# Patient Record
Sex: Female | Born: 1955 | Race: White | Hispanic: No | Marital: Married | Smoking: Never smoker
Health system: Southern US, Community
[De-identification: ages and names within clinical notes are randomized; demographics above are authoritative.]

## PROBLEM LIST (undated history)

## (undated) DIAGNOSIS — I471 Supraventricular tachycardia: Secondary | ICD-10-CM

## (undated) DIAGNOSIS — M2480 Other specific joint derangements of unspecified joint, not elsewhere classified: Secondary | ICD-10-CM

## (undated) DIAGNOSIS — C801 Malignant (primary) neoplasm, unspecified: Secondary | ICD-10-CM

## (undated) DIAGNOSIS — T7840XA Allergy, unspecified, initial encounter: Secondary | ICD-10-CM

## (undated) DIAGNOSIS — C20 Malignant neoplasm of rectum: Secondary | ICD-10-CM

## (undated) DIAGNOSIS — M810 Age-related osteoporosis without current pathological fracture: Secondary | ICD-10-CM

## (undated) DIAGNOSIS — Z780 Asymptomatic menopausal state: Secondary | ICD-10-CM

## (undated) HISTORY — PX: WISDOM TOOTH EXTRACTION: SHX21

## (undated) HISTORY — PX: COLONOSCOPY: SHX174

## (undated) HISTORY — DX: Asymptomatic menopausal state: Z78.0

## (undated) HISTORY — DX: Allergy, unspecified, initial encounter: T78.40XA

## (undated) HISTORY — DX: Age-related osteoporosis without current pathological fracture: M81.0

## (undated) HISTORY — DX: Supraventricular tachycardia: I47.1

## (undated) HISTORY — PX: TUBAL LIGATION: SHX77

## (undated) HISTORY — DX: Malignant (primary) neoplasm, unspecified: C80.1

## (undated) HISTORY — DX: Other specific joint derangements of unspecified joint, not elsewhere classified: M24.80

---
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
## 1991-08-29 HISTORY — PX: OVARIAN CYST SURGERY: SHX726

## 1997-11-25 ENCOUNTER — Ambulatory Visit (HOSPITAL_COMMUNITY): Admission: RE | Admit: 1997-11-25 | Discharge: 1997-11-25 | Payer: Self-pay | Admitting: Obstetrics and Gynecology

## 1999-01-06 ENCOUNTER — Encounter: Payer: Self-pay | Admitting: Obstetrics and Gynecology

## 1999-01-06 ENCOUNTER — Ambulatory Visit (HOSPITAL_COMMUNITY): Admission: RE | Admit: 1999-01-06 | Discharge: 1999-01-06 | Payer: Self-pay | Admitting: Obstetrics and Gynecology

## 2000-01-04 ENCOUNTER — Ambulatory Visit (HOSPITAL_COMMUNITY): Admission: RE | Admit: 2000-01-04 | Discharge: 2000-01-04 | Payer: Self-pay | Admitting: Obstetrics and Gynecology

## 2000-01-04 ENCOUNTER — Encounter: Payer: Self-pay | Admitting: Obstetrics and Gynecology

## 2001-01-09 ENCOUNTER — Encounter: Payer: Self-pay | Admitting: Obstetrics and Gynecology

## 2001-01-09 ENCOUNTER — Ambulatory Visit (HOSPITAL_COMMUNITY): Admission: RE | Admit: 2001-01-09 | Discharge: 2001-01-09 | Payer: Self-pay | Admitting: Obstetrics and Gynecology

## 2001-01-29 ENCOUNTER — Other Ambulatory Visit: Admission: RE | Admit: 2001-01-29 | Discharge: 2001-01-29 | Payer: Self-pay | Admitting: Obstetrics and Gynecology

## 2002-02-06 ENCOUNTER — Encounter: Payer: Self-pay | Admitting: Obstetrics and Gynecology

## 2002-02-06 ENCOUNTER — Ambulatory Visit (HOSPITAL_COMMUNITY): Admission: RE | Admit: 2002-02-06 | Discharge: 2002-02-06 | Payer: Self-pay | Admitting: Obstetrics and Gynecology

## 2002-03-11 ENCOUNTER — Other Ambulatory Visit: Admission: RE | Admit: 2002-03-11 | Discharge: 2002-03-11 | Payer: Self-pay | Admitting: Obstetrics and Gynecology

## 2003-02-20 ENCOUNTER — Encounter: Payer: Self-pay | Admitting: Obstetrics and Gynecology

## 2003-02-20 ENCOUNTER — Ambulatory Visit (HOSPITAL_COMMUNITY): Admission: RE | Admit: 2003-02-20 | Discharge: 2003-02-20 | Payer: Self-pay | Admitting: Obstetrics and Gynecology

## 2003-04-21 ENCOUNTER — Other Ambulatory Visit: Admission: RE | Admit: 2003-04-21 | Discharge: 2003-04-21 | Payer: Self-pay | Admitting: Obstetrics and Gynecology

## 2004-02-23 ENCOUNTER — Ambulatory Visit (HOSPITAL_COMMUNITY): Admission: RE | Admit: 2004-02-23 | Discharge: 2004-02-23 | Payer: Self-pay | Admitting: Obstetrics and Gynecology

## 2004-05-09 ENCOUNTER — Other Ambulatory Visit: Admission: RE | Admit: 2004-05-09 | Discharge: 2004-05-09 | Payer: Self-pay | Admitting: Obstetrics and Gynecology

## 2005-03-01 ENCOUNTER — Ambulatory Visit (HOSPITAL_COMMUNITY): Admission: RE | Admit: 2005-03-01 | Discharge: 2005-03-01 | Payer: Self-pay | Admitting: Obstetrics and Gynecology

## 2005-03-10 ENCOUNTER — Encounter: Admission: RE | Admit: 2005-03-10 | Discharge: 2005-03-10 | Payer: Self-pay | Admitting: Obstetrics and Gynecology

## 2005-05-15 ENCOUNTER — Other Ambulatory Visit: Admission: RE | Admit: 2005-05-15 | Discharge: 2005-05-15 | Payer: Self-pay | Admitting: Obstetrics and Gynecology

## 2005-07-05 ENCOUNTER — Ambulatory Visit: Payer: Self-pay | Admitting: Cardiology

## 2006-03-12 ENCOUNTER — Ambulatory Visit (HOSPITAL_COMMUNITY): Admission: RE | Admit: 2006-03-12 | Discharge: 2006-03-12 | Payer: Self-pay | Admitting: Obstetrics and Gynecology

## 2007-03-26 ENCOUNTER — Ambulatory Visit (HOSPITAL_COMMUNITY): Admission: RE | Admit: 2007-03-26 | Discharge: 2007-03-26 | Payer: Self-pay | Admitting: Obstetrics and Gynecology

## 2007-08-12 ENCOUNTER — Encounter (INDEPENDENT_AMBULATORY_CARE_PROVIDER_SITE_OTHER): Payer: Self-pay | Admitting: Obstetrics and Gynecology

## 2007-08-12 ENCOUNTER — Ambulatory Visit (HOSPITAL_COMMUNITY): Admission: RE | Admit: 2007-08-12 | Discharge: 2007-08-12 | Payer: Self-pay | Admitting: Obstetrics and Gynecology

## 2008-04-01 ENCOUNTER — Ambulatory Visit (HOSPITAL_COMMUNITY): Admission: RE | Admit: 2008-04-01 | Discharge: 2008-04-01 | Payer: Self-pay | Admitting: Obstetrics and Gynecology

## 2009-04-08 ENCOUNTER — Ambulatory Visit (HOSPITAL_COMMUNITY): Admission: RE | Admit: 2009-04-08 | Discharge: 2009-04-08 | Payer: Self-pay | Admitting: Obstetrics and Gynecology

## 2009-08-23 DIAGNOSIS — Z8679 Personal history of other diseases of the circulatory system: Secondary | ICD-10-CM | POA: Insufficient documentation

## 2009-08-23 DIAGNOSIS — I498 Other specified cardiac arrhythmias: Secondary | ICD-10-CM | POA: Insufficient documentation

## 2009-08-31 ENCOUNTER — Ambulatory Visit: Payer: Self-pay | Admitting: Cardiology

## 2009-09-01 ENCOUNTER — Telehealth: Payer: Self-pay | Admitting: Cardiology

## 2009-09-21 ENCOUNTER — Telehealth: Payer: Self-pay | Admitting: Cardiology

## 2009-09-21 ENCOUNTER — Ambulatory Visit (HOSPITAL_COMMUNITY): Admission: RE | Admit: 2009-09-21 | Discharge: 2009-09-21 | Payer: Self-pay | Admitting: Cardiology

## 2009-09-21 ENCOUNTER — Ambulatory Visit: Payer: Self-pay | Admitting: Cardiovascular Disease

## 2009-09-21 ENCOUNTER — Ambulatory Visit: Payer: Self-pay

## 2010-04-11 ENCOUNTER — Ambulatory Visit (HOSPITAL_COMMUNITY): Admission: RE | Admit: 2010-04-11 | Discharge: 2010-04-11 | Payer: Self-pay | Admitting: Obstetrics and Gynecology

## 2010-08-25 ENCOUNTER — Ambulatory Visit (HOSPITAL_COMMUNITY)
Admission: RE | Admit: 2010-08-25 | Discharge: 2010-08-25 | Payer: Self-pay | Source: Home / Self Care | Attending: Obstetrics and Gynecology | Admitting: Obstetrics and Gynecology

## 2010-09-29 NOTE — Progress Notes (Signed)
Summary: question regarding metoprolol  Phone Note Call from Patient Call back at [facility] Phone [phone] Call back at Work Phone [phone]   Caller: Patient Reason for Call: Talk to Nurse Details for Reason: pt was seen on 1/4 , gave wrong info regarding medication metoprolol . has question .  Initial call taken by: Lorne Skeens,  September 01, 2009 9:37 AM  Follow-up for Phone Call        lmtcb Scherrie Bateman, LPN  September 01, 2009 1:38 PM.PT RETURNING CALL.SIGN  LMTCB Scherrie Bateman, LPN  September 02, 2009 2:01 PM Follow-up by: Judie Grieve,  September 02, 2009 10:17 AM    New/Updated Medications: METOPROLOL SUCCINATE 25 MG XR24H-TAB (METOPROLOL SUCCINATE) 1 once daily Prescriptions: METOPROLOL SUCCINATE 25 MG XR24H-TAB (METOPROLOL SUCCINATE) 1 once daily  #30 x 11   Entered by:   Scherrie Bateman, LPN   Authorized by:   Gaylord Shih, MD, [facility]   Signed by:   Scherrie Bateman, LPN on 08/65/7846   Method used:   Electronically to        [id number] (retail)       [address]       [place], [place]  [address]       Ph: [phone] or [phone]       Fax: [phone]   RxID:   [id number]   Appended Document: question regarding metoprolol per pt needed refill on metoprolol succ called in ./cy

## 2010-09-29 NOTE — Assessment & Plan Note (Signed)
Summary: f1y/svt   Visit Type:  4 yr f/u Referring Provider:  Dr. Arelia Sneddon Primary Provider:  Geoffry Paradise  CC:  h/o SVT.Marland Kitchenpt states she has had 3 episode over the last 3 months...  History of Present Illness: Molly Vang is a 55 year old white female referred by Dr.McComb or increased frequency of tachycardia, previously diagnosed as SVT.  She's had about 4 episodes of last several months. One was as long as 4 hours while she was visiting in [place]. She said it was beating 180 times a minute.  I evaluated the patient in 2006. There was a history of mild mitral valve prolapse by 2-D echocardiogram in 1992 by Dr. Tresa Endo. She subsequently had another echocardiogram in 2003 which showed mild mitral valve thickening but no evidence of prolapse and no mitral regurgitation. She is very confused about this.  She is quite anxious and has multiple questions today about what she should do. As I told her back in 2006, if she continues to have frequent breakthrough she should have an ablation.  Current Medications (verified): 1)  Metoprolol Succinate 50 Mg Xr24h-Tab (Metoprolol Succinate) .... 1/2 Tab Once Daily 2)  Multivitamins   Tabs (Multiple Vitamin) .Marland Kitchen.. 1 Tab Once Daily 3)  Metoprolol Tartrate 25 Mg Tabs (Metoprolol Tartrate) .... Take One Tablet As Necessary For Rapid Heart Rate--May Repeat X 1-If Further Problems Please Go To Doctor or Nearest Ed  Allergies (verified): No Known Drug Allergies  Past History:  Past Medical History: Last updated: 08/23/2009 SUPRAVENTRICULAR TACHYCARDIA (ICD-427.89) MITRAL VALVE PROLAPSE, HX OF (ICD-V12.50)    Past Surgical History: Last updated: 08/23/2009 Laparoscopy for ovarian cyst ..1993 Tonsillectomy Tubal ligation  Family History: Last updated: 08/23/2009 noncontributory and negative as per office note 07/05/05  Social History: Last updated: 08/23/2009 Part Time ..substitute teacher Married  Tobacco Use - No.  Alcohol Use - yes Regular  Exercise - yes Drug Use - no  Risk Factors: Exercise: yes (08/23/2009)  Risk Factors: Smoking Status: never (08/23/2009)  Review of Systems       negative other than history of present illness  Vital Signs:  Patient profile:   55 year old female Height:      68 inches Weight:      146 pounds BMI:     22.28 Pulse rate:   64 / minute Pulse rhythm:   regular BP sitting:   106 / 74  (left arm) Cuff size:   large  Vitals Entered By: Danielle Rankin, CMA (August 31, 2009 4:20 PM)  Physical Exam  General:  thin, anxious Head:  normocephalic and atraumatic Eyes:  PERRLA/EOM intact; conjunctiva and lids normal. Neck:  Neck supple, no JVD. No masses, thyromegaly or abnormal cervical nodes. Chest Molly Vang:  no deformities or breast masses noted Lungs:  Clear bilaterally to auscultation and percussion. Heart:  normal S1-S2, no absolute click or murmur, PMI Abdomen:  Bowel sounds positive; abdomen soft and non-tender without masses, organomegaly, or hernias noted. No hepatosplenomegaly. Msk:  Back normal, normal gait. Muscle strength and tone normal. Skin:  Intact without lesions or rashes. Psych:  anxious and easily distracted.  anxious and easily distracted.     EKG  Procedure date:  08/31/2009  Findings:      nsinus rhythm, normal EKG  Impression & Recommendations:  Problem # 1:  SUPRAVENTRICULAR TACHYCARDIA (ICD-427.89) Assessment Deteriorated I have spent a long time answering multiple questions, some several times over. She can take an extra 25 mg metoprolol tartrate and repeat again hour she  has breakthrough. If it becomes more frequent I would consider ablation. If this is stained for more than hour, and advised to go to an urgent care or to come here during office hours to get an EKG. Her updated medication list for this problem includes:    Metoprolol Succinate 50 Mg Xr24h-tab (Metoprolol succinate) .Marland Kitchen... 1/2 tab once daily    Metoprolol Tartrate 25 Mg Tabs (Metoprolol  tartrate) .Marland Kitchen... Take one tablet as necessary for rapid heart rate--may repeat x 1-if further problems please go to doctor or nearest ed  Her updated medication list for this problem includes:    Metoprolol Succinate 50 Mg Xr24h-tab (Metoprolol succinate) .Marland Kitchen... 1/2 tab once daily  Problem # 2:  MITRAL VALVE PROLAPSE, HX OF (ICD-V12.50) I have recommended another echocardiogram to answer question once and for all that she had mitral valve prolapse. Orders: Echocardiogram (Echo)  Patient Instructions: 1)  Your physician recommends that you schedule a follow-up appointment in: 12 months or as necessary 2)  Your physician has recommended you make the following change in your medication: may take metprolol 25mg  (tartrate--short acting) for tachycardia if no results may repeat x 1--if continues go to nearest ER 3)  Your physician has requested that you have an echocardiogram.  Echocardiography is a painless test that uses sound waves to create images of your heart. It provides your doctor with information about the size and shape of your heart and how well your heart's chambers and valves are working.  This procedure takes approximately one hour. There are no restrictions for this procedure. Prescriptions: METOPROLOL TARTRATE 25 MG TABS (METOPROLOL TARTRATE) Take one tablet AS NECESSARY FOR RAPID HEART RATE--MAY REPEAT X 1-IF FURTHER PROBLEMS PLEASE GO TO DOCTOR OR NEAREST ED  #10 x 3   Entered by:   Ledon Snare, RN   Authorized by:   Gaylord Shih, MD, [facility]   Signed by:   Ledon Snare, RN on 08/31/2009   Method used:   Electronically to        [id number] ([facility]       [address]       [place], [place]  [address]       Ph: [phone] or [phone]       Fax: [phone]   RxID:   [id number]

## 2010-09-29 NOTE — Progress Notes (Signed)
Summary: returning call  Phone Note Call from Patient Call back at Home Phone [phone]   Caller: Patient Reason for Call: Talk to Nurse Complaint: Abdominal Pain Summary of Call: returning call Initial call taken by: Migdalia Dk,  September 21, 2009 3:18 PM  Follow-up for Phone Call        PT AWARE OF ECHO RESULTS Follow-up by: Scherrie Bateman, LPN,  September 21, 2009 3:44 PM

## 2010-11-16 ENCOUNTER — Other Ambulatory Visit: Payer: Self-pay | Admitting: Dermatology

## 2010-12-14 HISTORY — PX: NM MYOCAR PERF WALL MOTION: HXRAD629

## 2010-12-29 ENCOUNTER — Other Ambulatory Visit: Payer: Self-pay | Admitting: Dermatology

## 2011-01-10 NOTE — H&P (Signed)
Molly Vang                ACCOUNT NO.:  [id number]   MEDICAL RECORD NO.:  [id number]          PATIENT TYPE:  AMB   LOCATION:  SDC                           FACILITY:  [facility]   PHYSICIAN:  Juluis Mire, M.D.   DATE OF BIRTH:  1956-04-26   DATE OF ADMISSION:  08/12/2007  DATE OF DISCHARGE:                              HISTORY & PHYSICAL   The patient is a 55 year old, gravida 2, para 2, postmenopausal patient  who presents for hysteroscopy.   The patient had been having some abnormal bleeding, underwent a saline  fused ultrasound that revealed a 9 mm polypoid mass in the endometrial  cavity.  She also had a 3.7 cm simple right ovarian cyst.  Because of  endometrial polyps, she presents for hysteroscopic evaluation.   ALLERGIES:  The patient has no known drug allergies.   MEDICATIONS:  Toprol and multivitamins.   PAST MEDICAL HISTORY:  She does have a history of supraventricular  tachycardia for which she is on Toprol as noted.  Otherwise, usual  childhood diseases.   PAST SURGICAL HISTORY:  She had a tonsillectomy in 1961.  She also had a  previous laparoscopy with bilateral tubal ligation and ovarian  cystectomy.  This was the left ovary at that time.   OBSTETRICAL HISTORY:  Two vaginal deliveries.   FAMILY HISTORY:  No significant family history.   SOCIAL HISTORY:  No tobacco or alcohol use.   REVIEW OF SYSTEMS:  Noncontributory.   PHYSICAL EXAMINATION:  VITAL SIGNS:  The patient is afebrile, stable  vital signs.  HEENT: The patient is normocephalic.  Pupils equal and reactive to light  and  accommodation.  Extraocular were intact.  Sclerae and conjunctivae  clear.  Oropharynx clear.  NECK:  Without thyromegaly.  BREASTS:  No discrete masses.  LUNGS:  Clear.  CARDIAC:  Regular rate.  No murmurs or gallops.  ABDOMEN:  Exam is benign.  No mass, organomegaly or tenderness.  PELVIC:  Normal external genitalia.  Vaginal mucosa clear.  Cervix  unremarkable.  Uterus  normal size, shape and contour.  Adnexa free of  masses or tenderness.  EXTREMITIES:  Trace edema.  NEUROLOGIC:  Exam is grossly within normal limits.   IMPRESSION:  Abnormal postmenopausal bleeding with endometrial polyp.   PLAN:  The patient will undergo hysteroscopic evaluation with resection  of polyp.  The risks of surgery have been discussed including the risk  of infection.  Risk of hemorrhage could require transfusion with the  risk of AIDS or hepatitis.  Possible need for hysterectomy.  Risk of  perforation that could lead to injury to adjacent organs requiring  further exploratory surgery.  Risk of deep venous thrombosis and  pulmonary embolus.  The patient voiced understanding of indications and  risks.      Juluis Mire, M.D.  Electronically Signed     JSM/MEDQ  D:  08/12/2007  T:  08/12/2007  Job:  [id number]

## 2011-01-10 NOTE — Op Note (Signed)
Molly Vang, Molly Vang                ACCOUNT NO.:  [id number]   MEDICAL RECORD NO.:  [id number]          PATIENT TYPE:  AMB   LOCATION:  SDC                           FACILITY:  [facility]   PHYSICIAN:  Juluis Mire, M.D.   DATE OF BIRTH:  Mar 02, 1956   DATE OF PROCEDURE:  08/12/2007  DATE OF DISCHARGE:                               OPERATIVE REPORT   PREOPERATIVE DIAGNOSIS:  Endometrial polyp.   POSTOPERATIVE DIAGNOSIS:  Endometrial polyp.   OPERATIVE PROCEDURES:  1. Paracervical block.  2. Cervical dilatation.  3. Hysteroscopic evaluation AND resection of polyp.  4. Multiple endometrial biopsies and curettings.   ANESTHESIA:  Sedation with paracervical block.   ESTIMATED BLOOD LOSS:  Minimal.   PACKS AND DRAINS:  None.   INTRAOPERATIVE BLOOD REPLACED:  None.   COMPLICATIONS:  None.   INDICATIONS:  Dictated in the history and physical.   PROCEDURE:  The patient was taken to the OR, placed in supine position.  After sedation the patient was placed in the dorsal supine position  using the Allen stirrups.  The patient was draped as a sterile field.  A  speculum was placed in the vaginal vault.  Cervix and vagina cleansed  with Betadine.  A paracervical block was instituted using 1% Nesacaine.  The cervix was secured with a single-tooth tenaculum and the uterus  sounded to approximately 9 cm.  The cervix was serially dilated to a  size 33 Pratt dilator.  The operative hysteroscope was introduced and  the intrauterine cavity was distended using sorbitol.  Visualization  revealed a posterior wall fibroid that was resected in total and sent  for pathological review.  Multiple biopsies and curettings were  obtained.  Total deficit was 55 mL.  There were no signs of perforation  or active bleeding.  The single-tooth tenaculum and speculum were then  removed.  The patient taken out of the dorsal position and once alert  transferred to the recovery room in good condition.  Sponge,  instrument  and needle count was reported as correct by the circulating nurse.     Juluis Mire, M.D.  Electronically Signed    JSM/MEDQ  D:  08/12/2007  T:  08/13/2007  Job:  [id number]

## 2011-03-30 ENCOUNTER — Other Ambulatory Visit: Payer: Self-pay | Admitting: Dermatology

## 2011-04-03 ENCOUNTER — Other Ambulatory Visit (HOSPITAL_COMMUNITY): Payer: Self-pay | Admitting: Obstetrics and Gynecology

## 2011-04-03 DIAGNOSIS — Z1231 Encounter for screening mammogram for malignant neoplasm of breast: Secondary | ICD-10-CM

## 2011-05-04 ENCOUNTER — Ambulatory Visit (HOSPITAL_COMMUNITY)
Admission: RE | Admit: 2011-05-04 | Discharge: 2011-05-04 | Disposition: A | Discharge status: Home / Self Care | Payer: BC Managed Care – PPO | Source: Ambulatory Visit | Attending: Obstetrics and Gynecology | Admitting: Obstetrics and Gynecology

## 2011-05-04 DIAGNOSIS — Z1231 Encounter for screening mammogram for malignant neoplasm of breast: Secondary | ICD-10-CM

## 2011-06-05 LAB — CBC
HCT: 40.4
Hemoglobin: 13.9
MCHC: 34.5
WBC: 7.6

## 2011-11-14 HISTORY — PX: US ECHOCARDIOGRAPHY: HXRAD669

## 2011-11-20 ENCOUNTER — Other Ambulatory Visit (HOSPITAL_COMMUNITY): Payer: Self-pay | Admitting: Obstetrics and Gynecology

## 2011-11-20 DIAGNOSIS — E049 Nontoxic goiter, unspecified: Secondary | ICD-10-CM

## 2011-11-22 ENCOUNTER — Other Ambulatory Visit (HOSPITAL_COMMUNITY): Payer: BC Managed Care – PPO

## 2011-11-28 ENCOUNTER — Ambulatory Visit (HOSPITAL_COMMUNITY)
Admission: RE | Admit: 2011-11-28 | Discharge: 2011-11-28 | Disposition: A | Discharge status: Home / Self Care | Payer: BC Managed Care – PPO | Source: Ambulatory Visit | Attending: Obstetrics and Gynecology | Admitting: Obstetrics and Gynecology

## 2011-11-28 DIAGNOSIS — E042 Nontoxic multinodular goiter: Secondary | ICD-10-CM | POA: Insufficient documentation

## 2011-11-28 DIAGNOSIS — E049 Nontoxic goiter, unspecified: Secondary | ICD-10-CM

## 2012-04-17 ENCOUNTER — Other Ambulatory Visit (HOSPITAL_COMMUNITY): Payer: Self-pay | Admitting: Obstetrics and Gynecology

## 2012-04-17 DIAGNOSIS — Z1231 Encounter for screening mammogram for malignant neoplasm of breast: Secondary | ICD-10-CM

## 2012-05-14 ENCOUNTER — Ambulatory Visit (HOSPITAL_COMMUNITY): Payer: BC Managed Care – PPO

## 2012-05-22 ENCOUNTER — Ambulatory Visit (HOSPITAL_COMMUNITY)
Admission: RE | Admit: 2012-05-22 | Discharge: 2012-05-22 | Disposition: A | Discharge status: Home / Self Care | Payer: BC Managed Care – PPO | Source: Ambulatory Visit | Attending: Obstetrics and Gynecology | Admitting: Obstetrics and Gynecology

## 2012-05-22 DIAGNOSIS — Z1231 Encounter for screening mammogram for malignant neoplasm of breast: Secondary | ICD-10-CM | POA: Insufficient documentation

## 2012-08-28 HISTORY — PX: CARDIAC ELECTROPHYSIOLOGY STUDY AND ABLATION: SHX1294

## 2013-05-02 ENCOUNTER — Other Ambulatory Visit (HOSPITAL_COMMUNITY): Payer: Self-pay | Admitting: Obstetrics and Gynecology

## 2013-05-02 DIAGNOSIS — Z1231 Encounter for screening mammogram for malignant neoplasm of breast: Secondary | ICD-10-CM

## 2013-05-19 ENCOUNTER — Encounter: Payer: Self-pay | Admitting: Cardiology

## 2013-05-19 ENCOUNTER — Encounter (HOSPITAL_COMMUNITY): Payer: Self-pay

## 2013-05-19 ENCOUNTER — Ambulatory Visit (INDEPENDENT_AMBULATORY_CARE_PROVIDER_SITE_OTHER): Payer: BC Managed Care – PPO | Admitting: Cardiovascular Disease

## 2013-05-19 ENCOUNTER — Emergency Department (HOSPITAL_COMMUNITY)
Admission: EM | Admit: 2013-05-19 | Discharge: 2013-05-19 | Disposition: A | Discharge status: Home / Self Care | Payer: Federal, State, Local not specified - PPO | Attending: Emergency Medicine | Admitting: Emergency Medicine

## 2013-05-19 ENCOUNTER — Emergency Department (HOSPITAL_COMMUNITY): Payer: Federal, State, Local not specified - PPO

## 2013-05-19 ENCOUNTER — Encounter: Payer: Self-pay | Admitting: Cardiovascular Disease

## 2013-05-19 VITALS — BP 80/60 | HR 156 | Ht 68.0 in | Wt 146.9 lb

## 2013-05-19 DIAGNOSIS — I498 Other specified cardiac arrhythmias: Secondary | ICD-10-CM

## 2013-05-19 DIAGNOSIS — Z7982 Long term (current) use of aspirin: Secondary | ICD-10-CM | POA: Insufficient documentation

## 2013-05-19 DIAGNOSIS — Z79899 Other long term (current) drug therapy: Secondary | ICD-10-CM | POA: Insufficient documentation

## 2013-05-19 DIAGNOSIS — I471 Supraventricular tachycardia: Secondary | ICD-10-CM

## 2013-05-19 DIAGNOSIS — Z8679 Personal history of other diseases of the circulatory system: Secondary | ICD-10-CM

## 2013-05-19 LAB — COMPREHENSIVE METABOLIC PANEL
Albumin: 4.1 g/dL (ref 3.5–5.2)
BUN: 14 mg/dL (ref 6–23)
CO2: 25 mEq/L (ref 19–32)
Calcium: 8.3 mg/dL — ABNORMAL LOW (ref 8.4–10.5)
Chloride: 105 mEq/L (ref 96–112)
GFR calc Af Amer: >90 mL/min (ref >90)
Sodium: 138 mEq/L (ref 135–145)
Total Bilirubin: 0.2 mg/dL — ABNORMAL LOW (ref 0.3–1.2)

## 2013-05-19 LAB — CBC WITH DIFFERENTIAL/PLATELET
Basophils Absolute: 0.1 10*3/uL (ref 0.0–0.1)
Eosinophils Absolute: 0.1 10*3/uL (ref 0.0–0.7)
Lymphocytes Relative: 16 % (ref 12–46)
MCHC: 34.4 g/dL (ref 30.0–36.0)
Monocytes Absolute: 0.5 10*3/uL (ref 0.1–1.0)
Neutrophils Relative %: 77 % (ref 43–77)
Platelets: 296 10*3/uL (ref 150–400)
RDW: 12.7 % (ref 11.5–15.5)

## 2013-05-19 LAB — TROPONIN I: Troponin I: <0.3 ng/mL (ref <0.30)

## 2013-05-19 MED ORDER — IV SETS-TUBING KIT
1.0000 | PACK | Freq: Once | Status: AC
  Administered 2013-05-19: 1

## 2013-05-19 MED ORDER — METOPROLOL TARTRATE 1 MG/ML IV SOLN
2.5000 mg | Freq: Once | INTRAVENOUS | Status: AC
  Administered 2013-05-19: 2.5 mg via INTRAVENOUS

## 2013-05-19 NOTE — Progress Notes (Signed)
Molly Vang is an 57 y.o. female.    Primary Cardiologist:Dr. Tresa Endo No primary provider on file.  Chief Complaint: SVT, mild rare dizziness HPI: 28 YOWMF presents with SVT.  HR 155-160.  It started this AM at 0800.  She took 50 mg metoprolol po then another 25 mg po  She did eat BK fruit and + coffee.  She did some errands then drove to the office.  Here she is in  SVT 155-160.  Initial blood pressure was 82 systolic.  We attempted Valsalva maneuvers, carotid massage, she had already tried [place] before she came to the office .  I discussed with Dr. Tresa Endo, we started IV normal saline she received a total of 500 cc bolus she was given IV Lopressor 2.5 mg x2. Despite this treatment she remained in SVT.  At this point we felt transport to the ER for further management was appropriate.  Dr. Tresa Endo saw the patient and agreed with this plan.  She has no chest pain she has no shortness of breath. Maybe mild brief episode of dizziness.  Dr. Tresa Endo has followed her since 1992 for SVT. She stated her SVT began when she was 57 years old. 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  At one point she was on verapamil but has not been on verapamil for some time she also has when necessary metoprolol tartrate.  Last 2-D echo was done in March of 2013 revealing normal systolic function EF greater than 55% normal diastolic function. She had normal atrial size she did not have evidence for mitral valve prolapse at that point she has had a history that in the past. It did reveal trace mitral regurg and trace tricuspid regurg.     27 by EMS due to blood pressure 80 systolic and continued SVT with medications as previously stated. We did not her comfortable during adenosine in the office.  Past Medical History  Diagnosis Date  . SVT (supraventricular tachycardia)     Past Surgical History  Procedure Laterality Date  . Ovarian cyst removal   1993  . Tonsillectomy  1960  . US echocardiography  11/14/11    trace TR,MR    Family History  Problem Relation Age of Onset  . Hypertension Father   . Hypertension Mother   . Hyperlipidemia Father   . Hyperlipidemia Brother    Social History:  reports that she has never smoked. She does not have any smokeless tobacco history on file. She reports that she does not drink alcohol or use illicit drugs.  Allergies: No Known Allergies  Outpatient medications:  Aspirin 81 mg daily Calcium vitamin D Os-Cal with D5 100-202 tablets daily Initial omega-3 1 g by mouth daily Multivitamin daily   No results found for this or any previous visit (from the past 48 hour(s)). No results found.  ROS: General:no colds or fevers, no weight changes Skin:no rashes or ulcers HEENT:no blurred vision, no congestion CV:see HPI aware of rapid Heart rate PUL:see HPI GI:no diarrhea constipation or melena, no indigestion GU:no hematuria, no dysuria MS:no joint pain, no claudication Neuro:no syncope, no lightheadedness Endo:no diabetes, no thyroid disease   Blood pressure 80/60, pulse 156, height 5\' 8"  (1.727 m), weight 146 lb 14.4 oz (66.633 kg). PE: General:Pleasant affect, NAD Skin:Warm and dry, brisk capillary refill HEENT:normocephalic, sclera clear, mucus membranes moist Neck:supple, no JVD, no bruits  Heart:S1S2 tachycardic, rapid without murmur, gallup,  rub or click Lungs:clear without rales, rhonchi, or wheezes RUE:AVWU, non tender, + BS, do not palpate liver spleen or masses Ext:no lower ext edema, 2+ pedal pulses, 2+ radial pulses Neuro:alert and oriented, MAE, follows commands, + facial symmetry    Assessment/Plan SVT has 2.5 mg of IV Lopressor x2 and tunnel of 500 cc normal saline. Please note her blood pressure was 82 systolic on arrival prior to the metoprolol.  We are sending her to [facility] by EMS for further treatment.    [employer] R Nurse Practitioner  Certified [facility] and Vascular Pager [phone] 05/19/2013, 12:54 PM    Patient seen and examined. Agree with assessment and plan. 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 She had been on beta blocker therapy as well as verapamil in the past. The last several years she has essentially weaned herself off these medications. An echo Doppler study in March 2003 showed normal systolic and diastolic function. Normal atrial size. There is no evidence of mitral valve prolapse. She has a prescription for metoprolol which she has had available since she had stopped taking her daily medical regimen. This morning she admitted to having to caffeine drinks of coffee. At approximately 8 AM she began to notice her heart rate speeding up. She ultimately presented to our office. In the office here she is noted to be in superventricular tachycardia at a rate of approximately 160 beats per minute. It does appear by her ECG that she has retroactive P waves P-wave present very early into the T wave. She was hypotensive with a blood pressure in the 80s. She was started on intravenous normal saline received a total dose of 500 cc. She was given IV Lopressor 2.5 mg x2. This did not convert her. Carotid sinus massage was also performed. It was felt that the patient most likely would require a dose of adenosine but with her blood pressure being low in approximately 80 mmHg systolically the decision was made not to give the adenosine in the office setting in case she develops significant issues related to this. As result, EMS was contacted and the patient is to be transferred to the ER where laboratory obtained. The plan is for IV adenosine. She may ultimately be a candidate for EP ablation.   Lennette Bihari, MD, [facility] 05/19/2013 5:41 PM

## 2013-05-19 NOTE — Assessment & Plan Note (Signed)
Presented with SVT of 155, BP by doppler 86 systolic.  She had taken total of 75 po metoprolol at home, the SVT began at 0800.  Here in the office she has rec'd approx 500cc NS,  And Lopressor 2.5 mg IV X two here in the office.  SVT continues.  Dr. Tresa Endo has seen.  Will now transfer to [facility] ER for further treatment, sending by EMS.

## 2013-05-19 NOTE — ED Notes (Signed)
Pt with hx of SVT.  This morning shortly before 0800 pt felt fluttering in chest.  Pt denies CP, dizziness, etc.  Pt drove herself to [facility] where they attempted vagal maneuvers, gave 2.5mg  Lopressor x 2 doses, and NS x 2 doses with to conversion success.  EMS called out.  EMS administered 6mg  Adenosine and pt converted.  Pt has no complaints at this time.  Pt alert and oriented at this time.  NAD noted.

## 2013-05-19 NOTE — ED Notes (Signed)
Pt discharged.Vital signs stable and GCS 15 

## 2013-05-19 NOTE — Patient Instructions (Signed)
Will have you follow up in 2 weeks, though we will arrange EP visit for ablation.

## 2013-05-19 NOTE — ED Provider Notes (Signed)
CSN: [id number]     Arrival date & time 05/19/13  1327 History   First MD Initiated Contact with Patient 05/19/13 1318     Chief Complaint  Patient presents with  . Tachycardia   (Consider location/radiation/quality/duration/timing/severity/associated sxs/prior Treatment) HPI Patient presents after an episode of palpitations and SVT. She has a history of SVT. This morning, approximately 8 hours prior to my evaluation she developed fluttering sensation in her chest.  There is no associated pain or syncope, though there was mild lightheadedness. Patient saw her physician, received Lopressor, saline.  She had no conversion to sinus rhythm, was transferred here for further evaluation. On transfer the patient received adenosine via EMS, and had conversion to sinus rhythm. On my evaluation she is no complaints, states that she feels normal. She denies recent illness, does acknowledge increased activity in the past few days, including traveling, gardening, exercise.  Past Medical History  Diagnosis Date  . SVT (supraventricular tachycardia)    Past Surgical History  Procedure Laterality Date  . Ovarian cyst removal  1993  . Tonsillectomy  1960  . US echocardiography  11/14/11    trace TR,MR   Family History  Problem Relation Age of Onset  . Hypertension Father   . Hypertension Mother   . Hyperlipidemia Father   . Hyperlipidemia Brother    History  Substance Use Topics  . Smoking status: Never Smoker   . Smokeless tobacco: Not on file  . Alcohol Use: No   OB History   Grav Para Term Preterm Abortions TAB SAB Ect Mult Living                 Review of Systems  All other systems reviewed and are negative.    Allergies  Review of patient's allergies indicates no known allergies.  Home Medications   Current Outpatient Rx  Name  Route  Sig  Dispense  Refill  . aspirin 81 MG tablet   Oral   Take 81 mg by mouth daily.         . calcium-vitamin D (OSCAL WITH D) 500-200  MG-UNIT per tablet   Oral   Take 2 tablets by mouth daily with breakfast.         . fish oil-omega-3 fatty acids 1000 MG capsule   Oral   Take 1 g by mouth daily.         . Multiple Vitamin (MULTIVITAMIN) tablet   Oral   Take 1 tablet by mouth daily.          BP 110/83  Pulse 73  Resp 12  SpO2 100% Physical Exam  Nursing note and vitals reviewed. Constitutional: She is oriented to person, place, and time. She appears well-developed and well-nourished. No distress.  HENT:  Head: Normocephalic and atraumatic.  Eyes: Conjunctivae and EOM are normal.  Cardiovascular: Normal rate and regular rhythm.   Pulmonary/Chest: Effort normal and breath sounds normal. No stridor. No respiratory distress.  Abdominal: She exhibits no distension.  Musculoskeletal: She exhibits no edema.  Neurological: She is alert and oriented to person, place, and time. No cranial nerve deficit.  Skin: Skin is warm and dry.  Psychiatric: She has a normal mood and affect.    ED Course  Procedures (including critical care time) Labs Review Labs Reviewed  CBC WITH DIFFERENTIAL  COMPREHENSIVE METABOLIC PANEL  TROPONIN I   Imaging Review No results found. I discussed the patient's case with our EMS team.  I reviewed the patient's chart, including her  cardiology office visit from today.  On our exam today the patient has sinus rhythm, rate 70, unremarkable, pulse oximetry is 100% room air normal EMS rhythm strip at a rate of 150, supraventricular tachycardia, abnormal EKG here has sinus rhythm, rate 71, essentially unremarkable   3:50 PM On repeat exam the patient appears calm, has no new complaints.  Vital signs are stable.  Labs are unremarkable. All results discussed with the patient. Update: I discussed the patient's case with her cardiology team.  They will contact the patient for followup arrangements. MDM  No diagnosis found. Patient presents after an episode of SVT, resolved.   Throughout the patient's emergency department stay she had no new complaints, remained hemodynamically stable, with no new episodes of dysrhythmia.  With this a period of unremarkable monitoring, the reassuring physical exam, she is appropriate for discharge.  I had a lengthy discussion with the patient the need to followup, and following a conversation with her cardiologists, she was appropriate for discharge with close followup    Gerhard Munch, MD 05/19/13 1552

## 2013-05-19 NOTE — ED Notes (Signed)
Dr.Lockwood at bedside  

## 2013-05-23 ENCOUNTER — Encounter: Payer: Self-pay | Admitting: Cardiology

## 2013-05-27 ENCOUNTER — Encounter: Payer: Self-pay | Admitting: Cardiology

## 2013-05-27 ENCOUNTER — Ambulatory Visit (INDEPENDENT_AMBULATORY_CARE_PROVIDER_SITE_OTHER): Payer: BC Managed Care – PPO | Admitting: Cardiology

## 2013-05-27 ENCOUNTER — Ambulatory Visit (HOSPITAL_COMMUNITY): Payer: BC Managed Care – PPO

## 2013-05-27 VITALS — BP 118/92 | HR 70 | Ht 68.0 in | Wt 144.3 lb

## 2013-05-27 DIAGNOSIS — I471 Supraventricular tachycardia: Secondary | ICD-10-CM

## 2013-05-27 DIAGNOSIS — I498 Other specified cardiac arrhythmias: Secondary | ICD-10-CM

## 2013-05-27 MED ORDER — METOPROLOL TARTRATE 25 MG PO TABS: 12.5000 mg | ORAL_TABLET | Freq: Two times a day (BID) | ORAL | Status: DC

## 2013-05-27 NOTE — Assessment & Plan Note (Signed)
No further episodes.  No complaints.  We discussed taking Metoprolol tartrate BID, she is concerned about her BP on continued dose.  I have placed her on 12.5 mg BID.  Dr. Tresa Endo felt she would perhaps prevent episodes on BB.  I have also asked her to cut back on caffeine.  She will be scheduled to see EP.

## 2013-05-27 NOTE — Patient Instructions (Signed)
We will schedule you to see Dr. Graciela Husbands an electrophysiologist to discuss ablation. For now take Lopressor (metoprolol tartrate) 25 mg tabs, only half a tab twice a day to equal 12.5 mg twice a day.  Follow up with Dr. Tresa Endo in 3 months.

## 2013-05-27 NOTE — Progress Notes (Signed)
05/27/2013   PCP: Juluis Mire, MD   Chief Complaint  Patient presents with  . Follow-up    follow-up of episode SVT 05/19/13.  No problems since    Primary Cardiologist: Dr. Tresa Endo  HPI:  57 year old WMF presenting for follow up visit after presenting last week with SVT 155-160.  Pt was treated in the office with IV fluids and IV lopressor without converting.  EMS called and en route to ER was given 6 mg IV adenosine and converted to SR.  Labs were stable and pt was discharged from the ER.  Since that time she has not had any more  Episodes.     Dr. Tresa Endo has followed her since 1992 for SVT. She stated her SVT began when she was 57 years old. 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 At one point she was on verapamil but has not been on verapamil for some time  (she felt the verapamil caused more palpatations) she also has prn metoprolol tartrate.   Last 2-D echo was done in March of 2013 revealing normal systolic function EF greater than 55% normal diastolic function. She had normal atrial size she did not have evidence for mitral valve prolapse at that point she has had a history that in the past. It did reveal trace mitral regurg and trace tricuspid regurg.   Today no complaints ( no chest pain, no SOB) and would like to be referred  To EP for recommendations.   No Known Allergies  Current Outpatient Prescriptions  Medication Sig Dispense Refill  . aspirin 81 MG tablet Take 81 mg by mouth daily.      Marland Kitchen CALCIUM-VITAMIN D PO Take 1 tablet by mouth daily.      . cetirizine (ZYRTEC) 10 MG tablet Take 10 mg by mouth daily.      . fish oil-omega-3 fatty acids 1000 MG capsule Take 1 g by mouth daily.      . Multiple Vitamin (MULTIVITAMIN) tablet Take 1 tablet by mouth daily.      . naproxen sodium (ANAPROX) 220 MG tablet Take 220 mg by mouth daily as needed (for pain).      . metoprolol tartrate (LOPRESSOR) 25 MG  tablet Take 0.5 tablets (12.5 mg total) by mouth 2 (two) times daily.  30 tablet  6   No current facility-administered medications for this visit.    Past Medical History  Diagnosis Date  . SVT (supraventricular tachycardia)     Past Surgical History  Procedure Laterality Date  . Ovarian cyst removal  1993  . Tonsillectomy  1960  . US echocardiography  11/14/11    trace TR,MR    WNU:[id number]:YQ colds or fevers, no weight changes Skin:no rashes or ulcers HEENT:no blurred vision, no congestion CV:see HPI PUL:see HPI GI:no diarrhea constipation or melena, no indigestion GU:no hematuria, no dysuria MS:no joint pain, no claudication Neuro:no syncope, no lightheadedness Endo:no diabetes, no thyroid disease  PHYSICAL EXAM BP 118/92  Pulse 70  Ht 5\' 8"  (1.727 m)  Wt 144 lb 4.8 oz (65.454 kg)  BMI 21.95 kg/m2 General:Pleasant affect, NAD Skin:Warm and dry, brisk capillary refill HEENT:normocephalic, sclera clear, mucus membranes moist Neck:supple, no JVD, no bruits  Heart:S1S2 RRR without murmur, gallup, rub or click Lungs:clear without rales, rhonchi, or wheezes IHK:VQQV, non tender, + BS, do not palpate liver spleen or masses Ext:no lower ext edema, 2+ pedal pulses,  2+ radial pulses Neuro:alert and oriented, MAE, follows commands, + facial symmetry  EKG:SR no acute changes  ASSESSMENT AND PLAN SVT (supraventricular tachycardia) No further episodes.  No complaints.  We discussed taking Metoprolol tartrate BID, she is concerned about her BP on continued dose.  I have placed her on 12.5 mg BID.  Dr. Tresa Endo felt she would perhaps prevent episodes on BB.  I have also asked her to cut back on caffeine.  She will be scheduled to see EP.

## 2013-05-28 ENCOUNTER — Encounter: Payer: Self-pay | Admitting: Internal Medicine

## 2013-05-28 ENCOUNTER — Ambulatory Visit (INDEPENDENT_AMBULATORY_CARE_PROVIDER_SITE_OTHER): Payer: BC Managed Care – PPO | Admitting: Internal Medicine

## 2013-05-28 VITALS — BP 118/62 | HR 78 | Ht 68.0 in | Wt 144.0 lb

## 2013-05-28 DIAGNOSIS — I471 Supraventricular tachycardia: Secondary | ICD-10-CM

## 2013-05-28 DIAGNOSIS — I498 Other specified cardiac arrhythmias: Secondary | ICD-10-CM

## 2013-05-28 NOTE — Patient Instructions (Addendum)
Your physician has recommended that you have an ablation. Catheter ablation is a medical procedure used to treat some cardiac arrhythmias (irregular heartbeats). During catheter ablation, a long, thin, flexible tube is put into a blood vessel in your groin (upper thigh), or neck. This tube is called an ablation catheter. It is then guided to your heart through the blood vessel. Radio frequency waves destroy small areas of heart tissue where abnormal heartbeats may cause an arrhythmia to start. Please see the instruction sheet given to you today.  Molly Bast RN Molly Sours Taylor's nurse) will call you to schedule this.

## 2013-05-28 NOTE — Assessment & Plan Note (Signed)
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  All her symptoms are relatively infrequent, they have a major impact on her quality of life. We discussed treatment options including when necessary beta blockers or calcium blockers, antiarrhythmic drugs, and catheter ablation.  We reviewed catheter ablation and its risks and benefits, the former including but not limited to death, pericardial effusion, AV block requiring pacing, stroke and vascular injury. Not withstanding, she would like to proceed.  We will schedule her to undergo ablation with Dr. [name]

## 2013-05-28 NOTE — Progress Notes (Signed)
ELECTROPHYSIOLOGY CONSULT NOTE  Patient ID: Molly Vang, MRN: [id number], DOB/AGE: 05/23/1956 57 y.o. Admit date: (Not on file) Date of Consult: 05/28/2013  Primary Physician: Juluis Mire, MD Primary Cardiologist: new Chief Complaint: SVT    HPI Molly Vang is a 57 y.o. female  Seen because of recurrent SVT.  These began at the age of 34. They occur relatively infrequently 1-3 times per year. They're typically a relatively brief duration but she has had a number of spells that have lasted as long as 5 hours. They are associated with lightheadedness at the onset; there is no accompanying shortness of breath or chest pain.  She has found that Valsalva and/or cough and/or vomiting has been effective in terminating these episodes frequently but not universally.. Most recently she required adenosine for termination  Over the years she has been treated with daily beta blockers and calcium blockers with no significant appreciable impact  These episodes are frog negative and diuretic negative.  Cardiac evaluation has included an ultrasound which was normal and a stress test that was normal      Past Medical History  Diagnosis Date  . SVT (supraventricular tachycardia)       Surgical History:  Past Surgical History  Procedure Laterality Date  . Ovarian cyst removal  1993  . Tonsillectomy  1960  . US echocardiography  11/14/11    trace TR,MR     Home Meds: Prior to Admission medications   Medication Sig Start Date End Date Taking? Authorizing Provider  aspirin 81 MG tablet Take 81 mg by mouth daily.   Yes Historical Provider, MD  CALCIUM-VITAMIN D PO Take 1 tablet by mouth daily.   Yes Historical Provider, MD  cetirizine (ZYRTEC) 10 MG tablet Take 10 mg by mouth daily.   Yes Historical Provider, MD  fish oil-omega-3 fatty acids 1000 MG capsule Take 1 g by mouth daily.   Yes Historical Provider, MD  metoprolol tartrate (LOPRESSOR) 25 MG tablet Take 0.5 tablets (12.5 mg  total) by mouth 2 (two) times daily. 05/27/13  Yes Nada Boozer, NP  Multiple Vitamin (MULTIVITAMIN) tablet Take 1 tablet by mouth daily.   Yes Historical Provider, MD  naproxen sodium (ANAPROX) 220 MG tablet Take 220 mg by mouth daily as needed (for pain).   Yes Historical Provider, MD      Allergies: No Known Allergies  History   Social History  . Marital Status: Married    Spouse Name: N/A    Number of Children: N/A  . Years of Education: N/A   Occupational History  . Not on file.   Social History Main Topics  . Smoking status: Never Smoker   . Smokeless tobacco: Not on file  . Alcohol Use: No  . Drug Use: No  . Sexual Activity: Not on file   Other Topics Concern  . Not on file   Social History Narrative  . No narrative on file     Family History  Problem Relation Age of Onset  . Hypertension Father   . Hypertension Mother   . Hyperlipidemia Father   . Hyperlipidemia Brother      ROS:  Please see the history of present illness.     All other systems reviewed and negative.    Physical Exam: Blood pressure 132/90, pulse 78, height 5\' 8"  (1.727 m), weight 144 lb (65.318 kg). General: Well developed, well nourished female in no acute distress. Head: Normocephalic, atraumatic, sclera non-icteric, no xanthomas, nares are without discharge. EENT: normal  Lymph Nodes:  none Back: without scoliosis/kyphosis, no CVA tendersness Neck: Negative for carotid bruits. JVD not elevated. Lungs: Clear bilaterally to auscultation without wheezes, rales, or rhonchi. Breathing is unlabored. Heart: RRR with S1 S2. No  murmur , rubs, or gallops appreciated. Abdomen: Soft, non-tender, non-distended with normoactive bowel sounds. No hepatomegaly. No rebound/guarding. No obvious abdominal masses. Msk:  Strength and tone appear normal for age. Extremities: No clubbing or cyanosis. No  edema.  Distal pedal pulses are 2+ and equal bilaterally. Skin: Warm and Dry Neuro: Alert and  oriented X 3. CN III-XII intact Grossly normal sensory and motor function . Psych:  Responds to questions appropriately with a normal affect.      Labs: Cardiac Enzymes No results found for this basename: CKTOTAL, CKMB, TROPONINI,  in the last 72 hours CBC Lab Results  Component Value Date   WBC 8.8 05/19/2013   HGB 12.9 05/19/2013   HCT 37.5 05/19/2013   MCV 88.0 05/19/2013   PLT 296 05/19/2013   PROTIME: No results found for this basename: LABPROT, INR,  in the last 72 hours Chemistry No results found for this basename: NA, K, CL, CO2, BUN, CREATININE, CALCIUM, LABALBU, PROT, BILITOT, ALKPHOS, ALT, AST, GLUCOSE,  in the last 168 hours Lipids No results found for this basename: CHOL, HDL, LDLCALC, TRIG   BNP No results found for this basename: probnp   Miscellaneous No results found for this basename: DDIMER    Radiology/Studies:  Dg Chest 2 View  05/19/2013   CLINICAL DATA:  57 year old female tachycardia. Super ventricular tachycardia. Chest discomfort.  EXAM: CHEST  2 VIEW  COMPARISON:  None.  FINDINGS: Cardiac size and contour within normal limits. Other mediastinal contours are within normal limits. Visualized tracheal air column is within normal limits. The lungs are clear. No pneumothorax or effusion. No osseous abnormality identified.  IMPRESSION: Negative, no acute cardiopulmonary abnormality.   Electronically Signed   By: Augusto Gamble M.D.   On: 05/19/2013 14:12    EKG:  Date September 30 demonstrates sinus rhythm at 70 Intervals 14/09/43 No evidence of delta waves   ECG dated 05/19/13  SVT at a cycle length of 400 ms the retrograde P wave is inscribed in the proximal portion of the ST segment is notably of lead2   And delayed R. Prime in lead V1 Assessment and Plan:   Molly Vang

## 2013-06-02 ENCOUNTER — Telehealth: Payer: Self-pay | Admitting: *Deleted

## 2013-06-02 ENCOUNTER — Encounter: Payer: Self-pay | Admitting: *Deleted

## 2013-06-02 ENCOUNTER — Other Ambulatory Visit: Payer: Self-pay | Admitting: *Deleted

## 2013-06-02 DIAGNOSIS — I471 Supraventricular tachycardia: Secondary | ICD-10-CM

## 2013-06-02 NOTE — Telephone Encounter (Signed)
Called patient to set up/verify ablation with Sharrell Ku on 06/11/13 at 12:30. Lab work scheduled for 06/03/13. Went over instructions with patient, who verbalized understanding of instructions.

## 2013-06-03 ENCOUNTER — Other Ambulatory Visit (INDEPENDENT_AMBULATORY_CARE_PROVIDER_SITE_OTHER): Payer: Federal, State, Local not specified - PPO

## 2013-06-03 ENCOUNTER — Ambulatory Visit (HOSPITAL_COMMUNITY)
Admission: RE | Admit: 2013-06-03 | Discharge: 2013-06-03 | Disposition: A | Discharge status: Home / Self Care | Payer: Federal, State, Local not specified - PPO | Source: Ambulatory Visit | Attending: Obstetrics and Gynecology | Admitting: Obstetrics and Gynecology

## 2013-06-03 DIAGNOSIS — Z1231 Encounter for screening mammogram for malignant neoplasm of breast: Secondary | ICD-10-CM

## 2013-06-03 DIAGNOSIS — I471 Supraventricular tachycardia: Secondary | ICD-10-CM

## 2013-06-03 LAB — CBC WITH DIFFERENTIAL/PLATELET
Basophils Absolute: 0.1 10*3/uL (ref 0.0–0.1)
Basophils Relative: 0.7 % (ref 0.0–3.0)
Eosinophils Relative: 1.7 % (ref 0.0–5.0)
HCT: 37.9 % (ref 36.0–46.0)
Hemoglobin: 12.8 g/dL (ref 12.0–15.0)
Lymphs Abs: 2.3 10*3/uL (ref 0.7–4.0)
MCHC: 33.9 g/dL (ref 30.0–36.0)
MCV: 89 fl (ref 78.0–100.0)
Monocytes Absolute: 0.7 10*3/uL (ref 0.1–1.0)
Monocytes Relative: 7.9 % (ref 3.0–12.0)
Neutro Abs: 5.9 10*3/uL (ref 1.4–7.7)
Platelets: 300 10*3/uL (ref 150.0–400.0)
RBC: 4.26 Mil/uL (ref 3.87–5.11)
RDW: 12.9 % (ref 11.5–14.6)
WBC: 9.2 10*3/uL (ref 4.5–10.5)

## 2013-06-03 LAB — BASIC METABOLIC PANEL
BUN: 12 mg/dL (ref 6–23)
CO2: 29 mEq/L (ref 19–32)
Chloride: 105 mEq/L (ref 96–112)
Creatinine, Ser: 0.8 mg/dL (ref 0.4–1.2)
Glucose, Bld: 87 mg/dL (ref 70–99)
Potassium: 3.7 mEq/L (ref 3.5–5.1)

## 2013-06-05 ENCOUNTER — Encounter (HOSPITAL_COMMUNITY): Payer: Self-pay | Admitting: Pharmacist

## 2013-06-10 ENCOUNTER — Encounter: Payer: Self-pay | Admitting: Internal Medicine

## 2013-06-11 ENCOUNTER — Encounter (HOSPITAL_COMMUNITY)
Admission: RE | Disposition: A | Discharge status: Home / Self Care | Payer: Self-pay | Source: Ambulatory Visit | Attending: Internal Medicine

## 2013-06-11 ENCOUNTER — Ambulatory Visit (HOSPITAL_COMMUNITY)
Admission: RE | Admit: 2013-06-11 | Discharge: 2013-06-11 | Disposition: A | Discharge status: Home / Self Care | Payer: Federal, State, Local not specified - PPO | Source: Ambulatory Visit | Attending: Internal Medicine | Admitting: Internal Medicine

## 2013-06-11 ENCOUNTER — Encounter (HOSPITAL_COMMUNITY): Payer: Self-pay | Admitting: General Practice

## 2013-06-11 DIAGNOSIS — I471 Supraventricular tachycardia, unspecified: Secondary | ICD-10-CM

## 2013-06-11 DIAGNOSIS — Z79899 Other long term (current) drug therapy: Secondary | ICD-10-CM | POA: Insufficient documentation

## 2013-06-11 DIAGNOSIS — I498 Other specified cardiac arrhythmias: Secondary | ICD-10-CM

## 2013-06-11 DIAGNOSIS — Z8679 Personal history of other diseases of the circulatory system: Secondary | ICD-10-CM

## 2013-06-11 HISTORY — DX: Supraventricular tachycardia: I47.1

## 2013-06-11 HISTORY — PX: SUPRAVENTRICULAR TACHYCARDIA ABLATION: SHX5492

## 2013-06-11 HISTORY — DX: Supraventricular tachycardia, unspecified: I47.10

## 2013-06-11 HISTORY — PX: SUPRAVENTRICULAR TACHYCARDIA ABLATION: SHX6106

## 2013-06-11 SURGERY — SUPRAVENTRICULAR TACHYCARDIA ABLATION
Anesthesia: LOCAL

## 2013-06-11 MED ORDER — SODIUM CHLORIDE 0.9 % IV SOLN: INTRAVENOUS | Status: DC

## 2013-06-11 MED ORDER — FENTANYL CITRATE 0.05 MG/ML IJ SOLN
INTRAMUSCULAR | Status: AC
  Filled 2013-06-11: qty 2

## 2013-06-11 MED ORDER — SODIUM CHLORIDE 0.9 % IJ SOLN: 3.0000 mL | INTRAMUSCULAR | Status: DC | PRN

## 2013-06-11 MED ORDER — MIDAZOLAM HCL 5 MG/5ML IJ SOLN
INTRAMUSCULAR | Status: AC
  Filled 2013-06-11: qty 5

## 2013-06-11 MED ORDER — LIDOCAINE HCL (PF) 1 % IJ SOLN
INTRAMUSCULAR | Status: AC
  Filled 2013-06-11: qty 30

## 2013-06-11 MED ORDER — ONDANSETRON HCL 4 MG/2ML IJ SOLN: 4.0000 mg | Freq: Four times a day (QID) | INTRAMUSCULAR | Status: DC | PRN

## 2013-06-11 MED ORDER — SODIUM CHLORIDE 0.9 % IV SOLN: 250.0000 mL | INTRAVENOUS | Status: DC | PRN

## 2013-06-11 MED ORDER — ACETAMINOPHEN 325 MG PO TABS
650.0000 mg | ORAL_TABLET | ORAL | Status: DC | PRN
  Administered 2013-06-11: 650 mg via ORAL
  Filled 2013-06-11: qty 2

## 2013-06-11 MED ORDER — SODIUM CHLORIDE 0.9 % IJ SOLN: 3.0000 mL | Freq: Two times a day (BID) | INTRAMUSCULAR | Status: DC

## 2013-06-11 MED ORDER — HEPARIN (PORCINE) IN NACL 2-0.9 UNIT/ML-% IJ SOLN
INTRAMUSCULAR | Status: AC
  Filled 2013-06-11: qty 500

## 2013-06-11 MED ORDER — OFF THE BEAT BOOK
Freq: Once | Status: AC
  Administered 2013-06-11: 20:00:00
  Filled 2013-06-11 (×2): qty 1

## 2013-06-11 NOTE — H&P (View-Only) (Signed)
 ELECTROPHYSIOLOGY CONSULT NOTE  Patient ID: Molly Vang, MRN: [id number], DOB/AGE: 10/28/1955 57 y.o. Admit date: (Not on file) Date of Consult: 05/28/2013  Primary Physician: MCCOMB,JOHN S, MD Primary Cardiologist: new Chief Complaint: SVT    HPI Molly Vang is a 56 y.o. female  Seen because of recurrent SVT.  These began at the age of 15. They occur relatively infrequently 1-3 times per year. They're typically a relatively brief duration but she has had a number of spells that have lasted as long as 5 hours. They are associated with lightheadedness at the onset; there is no accompanying shortness of breath or chest pain.  She has found that Valsalva and/or cough and/or vomiting has been effective in terminating these episodes frequently but not universally.. Most recently she required adenosine for termination  Over the years she has been treated with daily beta blockers and calcium blockers with no significant appreciable impact  These episodes are frog negative and diuretic negative.  Cardiac evaluation has included an ultrasound which was normal and a stress test that was normal      Past Medical History  Diagnosis Date  . SVT (supraventricular tachycardia)       Surgical History:  Past Surgical History  Procedure Laterality Date  . Ovarian cyst removal  1993  . Tonsillectomy  1960  . Us echocardiography  11/14/11    trace TR,MR     Home Meds: Prior to Admission medications   Medication Sig Start Date End Date Taking? Authorizing Provider  aspirin 81 MG tablet Take 81 mg by mouth daily.   Yes Historical Provider, MD  CALCIUM-VITAMIN D PO Take 1 tablet by mouth daily.   Yes Historical Provider, MD  cetirizine (ZYRTEC) 10 MG tablet Take 10 mg by mouth daily.   Yes Historical Provider, MD  fish oil-omega-3 fatty acids 1000 MG capsule Take 1 g by mouth daily.   Yes Historical Provider, MD  metoprolol tartrate (LOPRESSOR) 25 MG tablet Take 0.5 tablets (12.5 mg  total) by mouth 2 (two) times daily. 05/27/13  Yes Laura Ingold, NP  Multiple Vitamin (MULTIVITAMIN) tablet Take 1 tablet by mouth daily.   Yes Historical Provider, MD  naproxen sodium (ANAPROX) 220 MG tablet Take 220 mg by mouth daily as needed (for pain).   Yes Historical Provider, MD      Allergies: No Known Allergies  History   Social History  . Marital Status: Married    Spouse Name: N/A    Number of Children: N/A  . Years of Education: N/A   Occupational History  . Not on file.   Social History Main Topics  . Smoking status: Never Smoker   . Smokeless tobacco: Not on file  . Alcohol Use: No  . Drug Use: No  . Sexual Activity: Not on file   Other Topics Concern  . Not on file   Social History Narrative  . No narrative on file     Family History  Problem Relation Age of Onset  . Hypertension Father   . Hypertension Mother   . Hyperlipidemia Father   . Hyperlipidemia Brother      ROS:  Please see the history of present illness.     All other systems reviewed and negative.    Physical Exam: Blood pressure 132/90, pulse 78, height 5' 8" (1.727 m), weight 144 lb (65.318 kg). General: Well developed, well nourished female in no acute distress. Head: Normocephalic, atraumatic, sclera non-icteric, no xanthomas, nares are without discharge. EENT: normal   Lymph Nodes:  none Back: without scoliosis/kyphosis, no CVA tendersness Neck: Negative for carotid bruits. JVD not elevated. Lungs: Clear bilaterally to auscultation without wheezes, rales, or rhonchi. Breathing is unlabored. Heart: RRR with S1 S2. No  murmur , rubs, or gallops appreciated. Abdomen: Soft, non-tender, non-distended with normoactive bowel sounds. No hepatomegaly. No rebound/guarding. No obvious abdominal masses. Msk:  Strength and tone appear normal for age. Extremities: No clubbing or cyanosis. No  edema.  Distal pedal pulses are 2+ and equal bilaterally. Skin: Warm and Dry Neuro: Alert and  oriented X 3. CN III-XII intact Grossly normal sensory and motor function . Psych:  Responds to questions appropriately with a normal affect.      Labs: Cardiac Enzymes No results found for this basename: CKTOTAL, CKMB, TROPONINI,  in the last 72 hours CBC Lab Results  Component Value Date   WBC 8.8 05/19/2013   HGB 12.9 05/19/2013   HCT 37.5 05/19/2013   MCV 88.0 05/19/2013   PLT 296 05/19/2013   PROTIME: No results found for this basename: LABPROT, INR,  in the last 72 hours Chemistry No results found for this basename: NA, K, CL, CO2, BUN, CREATININE, CALCIUM, LABALBU, PROT, BILITOT, ALKPHOS, ALT, AST, GLUCOSE,  in the last 168 hours Lipids No results found for this basename: CHOL, HDL, LDLCALC, TRIG   BNP No results found for this basename: probnp   Miscellaneous No results found for this basename: DDIMER    Radiology/Studies:  Dg Chest 2 View  05/19/2013   CLINICAL DATA:  57-year-old female tachycardia. Super ventricular tachycardia. Chest discomfort.  EXAM: CHEST  2 VIEW  COMPARISON:  None.  FINDINGS: Cardiac size and contour within normal limits. Other mediastinal contours are within normal limits. Visualized tracheal air column is within normal limits. The lungs are clear. No pneumothorax or effusion. No osseous abnormality identified.  IMPRESSION: Negative, no acute cardiopulmonary abnormality.   Electronically Signed   By: Lee  Hall M.D.   On: 05/19/2013 14:12    EKG:  Date September 30 demonstrates sinus rhythm at 70 Intervals 14/09/43 No evidence of delta waves   ECG dated 05/19/13  SVT at a cycle length of 400 ms the retrograde P wave is inscribed in the proximal portion of the ST segment is notably of lead2   And delayed R. Prime in lead V1 Assessment and Plan:   Steven Klein   

## 2013-06-11 NOTE — Progress Notes (Signed)
Patient ambulated in hall .Denies pain or shortness of breath right groin and right neck incision stable no bleeding or swelling noted.

## 2013-06-11 NOTE — Interval H&P Note (Signed)
History and Physical Interval Note: Patient seen and examined. I've reviewed the findings of Dr. Graciela Husbands. The patient has had medically refractory, recurrent SVT for nearly 40 years. Her symptoms have increased in frequency and severity in the last several years. I discussed the risk, benefits, goals, and expectations of electrophysiology study and catheter ablation with the patient, and she wishes to proceed. 06/11/2013 11:19 AM  Molly Vang  has presented today for surgery, with the diagnosis of svt  The various methods of treatment have been discussed with the patient and family. After consideration of risks, benefits and other options for treatment, the patient has consented to  Procedure(s): SUPRAVENTRICULAR TACHYCARDIA ABLATION (N/A) as a surgical intervention .  The patient's history has been reviewed, patient examined, no change in status, stable for surgery.  I have reviewed the patient's chart and labs.  Questions were answered to the patient's satisfaction.     [name]

## 2013-06-11 NOTE — Progress Notes (Signed)
Discharge instructions given to patient and patient verbalized understanding.Patient to be discharged home with husband.

## 2013-06-11 NOTE — CV Procedure (Signed)
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 [id number].

## 2013-06-12 NOTE — Op Note (Signed)
Molly Vang, Molly Vang                ACCOUNT NO.:  [id number]  MEDICAL RECORD NO.:  [id number]  LOCATION:  6C06C                        FACILITY:  [facility]  PHYSICIAN:  Doylene Canning. 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  INTRODUCTION:  The patient is a very pleasant 57 year old woman with a 30-year history of tachy palpitations and documented SVT.  She has had several recent episodes lasting up to 4 hours in duration.  They start and stop suddenly.  She has had documented narrow QRS tachycardia terminated with adenosine and she is now referred for catheter ablation and she has failed medical therapy.  PROCEDURE:  After informed consent was obtained, the patient was taken to the Diagnostic EP Lab in the fasting state.  After usual preparation and draping, intravenous fentanyl and midazolam was given for sedation. A 6-French hexapolar catheter was inserted percutaneously in the right jugular vein and advanced to the coronary sinus.  A 6-French quadripolar catheter was inserted percutaneously in the right femoral vein and advanced to the right ventricle and a 6-French quadripolar catheter was inserted percutaneously in the right femoral vein and advanced to the His bundle region.  After measurement of the basic intervals, rapid ventricular pacing was carried out from the right ventricle paced at cycle length of 600 milliseconds and stepwise decreased down to 320 milliseconds where VA Wenckebach was observed.  During rapid ventricular pacing, the atrial activation sequence was midline and decremental. Next, programmed ventricular stimulation was carried out from the right ventricle at a base drive cycle length of 161 milliseconds.  The S1-S2 interval in stepwise decreased from  440 milliseconds down to 250 milliseconds, where ventricular refractoriness was observed.  During programmed ventricular stimulation, the atrial activation sequence was midline and decremental.  Next, programmed atrial stimulation was carried out from the atrium at a base drive cycle length of 096 milliseconds.  The S1-S2 interval in stepwise decreased down to 260 milliseconds, where atrial refractoriness was observed.  During programmed atrial stimulation, there were 8 jumps, but no echo beats, and no inducible SVT.  Next, rapid atrial pacing was carried out from the atrium at a base drive cycle length of 045 milliseconds and stepwise decreased down to 360 milliseconds resulting in the initiation of SVT. During rapid atrial pacing, the PR interval was greater than the RR interval prior to the induction of SVT.  Mapping of SVT demonstrated a very short VA interval with earliest atrial activation in the His A and the CS56.  PVCs placed at the time of His bundle refractoriness did not pre-excite the atrium and ventricular pacing during tachycardia resulted in a [place] activation sequence.  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  Mapping of Koch's triangle was then carried out.  A total of 8 brief RF energy applications were delivered to sites  5 through 9 in Koch's triangle resulting in accelerated junctional rhythm during RF energy application.  Following RF energy application, additional rapid atrial pacing was carried out demonstrating that now the PR interval was less than the RR interval, and there was no inducible SVT.  The patient was observed for approximately 20 minutes and during this time, additional rapid atrial pacing was carried out demonstrating no inducible SVT and no PR interval greater than RR interval which was the  case prior to the ablation.  The catheters were removed.  Hemostasis was assured and the patient was returned to her room in satisfactory condition.  COMPLICATIONS:  There were no immediate procedure complications.  RESULTS:  a.  Baseline ECG.  The baseline ECG demonstrates sinus rhythm with normal axis and intervals. b.  Baseline intervals.  Sinus node cycle length was 790 milliseconds. The QRS duration 83 milliseconds.  The HV interval was 34 milliseconds. The AH interval was 83 milliseconds. c.  Rapid ventricular pacing.  Rapid ventricular pacing was carried out from the right ventricle demonstrating a VA Wenckebach cycle length of 320 milliseconds.  During rapid ventricular pacing, the atrial activation sequence was midline and decremental.  Next, programmed ventricular stimulation was carried out. d.  Programmed ventricular stimulation.  Programmed ventricular stimulation was carried out from the right ventricle at a base drive cycle length of 161 milliseconds.  The S1-S2 interval stepwise decreased rather down to 250 milliseconds, where ventricular refractoriness was observed.  During programmed ventricular stimulation, the atrial activation sequence remained midline and decremental.  There was no inducible SVT. e.  Rapid atrial pacing.  Rapid atrial pacing was carried out from the atrium at a base drive cycle length of 096 milliseconds and stepwise decreased down to 350 milliseconds, where SVT was induced.  Following catheter ablation, rapid atrial pacing demonstrated a PR interval less than the RR interval and there was no inducible SVT. f.  Arrhythmia is observed.  #1 AV node reentrant tachycardia initiation was with rapid atrial pacing and spontaneous.  The duration was sustained and cycle length was 350 milliseconds. g.  Mapping.  Mapping of Koch's triangle demonstrated normal size and orientation. h.  RF energy application.  A total of 8 brief RF energy applications were  delivered to Koch's triangle.  Following RF energy application, there was no inducible SVT.  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     Doylene Canning. Ladona Ridgel, MD     GWT/MEDQ  D:  06/11/2013  T:  06/12/2013  Job:  [id number]

## 2013-06-19 ENCOUNTER — Ambulatory Visit (AMBULATORY_SURGERY_CENTER): Payer: Self-pay | Admitting: *Deleted

## 2013-06-19 ENCOUNTER — Telehealth: Payer: Self-pay | Admitting: *Deleted

## 2013-06-19 VITALS — Ht 68.0 in | Wt 145.8 lb

## 2013-06-19 DIAGNOSIS — Z1211 Encounter for screening for malignant neoplasm of colon: Secondary | ICD-10-CM

## 2013-06-19 MED ORDER — MOVIPREP 100 G PO SOLR: ORAL | Status: DC

## 2013-06-19 NOTE — Telephone Encounter (Signed)
Molly Vang,  This pt is cleared for [facility]  Thanks,  Jonny Ruiz

## 2013-06-19 NOTE — Telephone Encounter (Signed)
John: pt is scheduled for colonoscopy with Dr. Juanda Chance on Friday 06/27/13.  She has long hx of SVT; most recent 10/6.  Had ablation on 10/15 with no more occurences?  Is pt okay for [facility] with ablation being a little more than 2 weeks before colonoscopy?  Thanks, Olegario Messier in [facility]

## 2013-06-20 NOTE — Telephone Encounter (Signed)
Pt notified ok to proceed with colonoscopy as scheduled 

## 2013-06-24 ENCOUNTER — Encounter: Payer: Self-pay | Admitting: Internal Medicine

## 2013-06-27 ENCOUNTER — Ambulatory Visit (AMBULATORY_SURGERY_CENTER): Payer: Federal, State, Local not specified - PPO | Admitting: Internal Medicine

## 2013-06-27 ENCOUNTER — Encounter: Payer: Self-pay | Admitting: Internal Medicine

## 2013-06-27 VITALS — BP 104/66 | HR 64 | Temp 97.3°F | Resp 12 | Ht 68.0 in | Wt 145.0 lb

## 2013-06-27 DIAGNOSIS — D126 Benign neoplasm of colon, unspecified: Secondary | ICD-10-CM

## 2013-06-27 DIAGNOSIS — Z1211 Encounter for screening for malignant neoplasm of colon: Secondary | ICD-10-CM

## 2013-06-27 MED ORDER — SODIUM CHLORIDE 0.9 % IV SOLN: 500.0000 mL | INTRAVENOUS | Status: DC

## 2013-06-27 NOTE — Patient Instructions (Signed)
YOU HAD AN ENDOSCOPIC PROCEDURE TODAY AT THE [facility]: Refer to the procedure report that was given to you for any specific questions about what was found during the examination.  If the procedure report does not answer your questions, please call your gastroenterologist to clarify.  If you requested that your care partner not be given the details of your procedure findings, then the procedure report has been included in a sealed envelope for you to review at your convenience later.  YOU SHOULD EXPECT: Some feelings of bloating in the abdomen. Passage of more gas than usual.  Walking can help get rid of the air that was put into your GI tract during the procedure and reduce the bloating. If you had a lower endoscopy (such as a colonoscopy or flexible sigmoidoscopy) you may notice spotting of blood in your stool or on the toilet paper. If you underwent a bowel prep for your procedure, then you may not have a normal bowel movement for a few days.  DIET: Your first meal following the procedure should be a light meal and then it is ok to progress to your normal diet.  A half-sandwich or bowl of soup is an example of a good first meal.  Heavy or fried foods are harder to digest and may make you feel nauseous or bloated.  Likewise meals heavy in dairy and vegetables can cause extra gas to form and this can also increase the bloating.  Drink plenty of fluids but you should avoid alcoholic beverages for 24 hours.  ACTIVITY: Your care partner should take you home directly after the procedure.  You should plan to take it easy, moving slowly for the rest of the day.  You can resume normal activity the day after the procedure however you should NOT DRIVE or use heavy machinery for 24 hours (because of the sedation medicines used during the test).    SYMPTOMS TO REPORT IMMEDIATELY: A gastroenterologist can be reached at any hour.  During normal business hours, 8:30 AM to 5:00 PM Monday through Friday,  call [phone].  After hours and on weekends, please call the GI answering service at [phone] who will take a message and have the physician on call contact you.   Following lower endoscopy (colonoscopy or flexible sigmoidoscopy):  Excessive amounts of blood in the stool  Significant tenderness or worsening of abdominal pains  Swelling of the abdomen that is new, acute  Fever of 100F or higher    FOLLOW UP: If any biopsies were taken you will be contacted by phone or by letter within the next 1-3 weeks.  Call your gastroenterologist if you have not heard about the biopsies in 3 weeks.  Our staff will call the home number listed on your records the next business day following your procedure to check on you and address any questions or concerns that you may have at that time regarding the information given to you following your procedure. This is a courtesy call and so if there is no answer at the home number and we have not heard from you through the emergency physician on call, we will assume that you have returned to your regular daily activities without incident.  SIGNATURES/CONFIDENTIALITY: You and/or your care partner have signed paperwork which will be entered into your electronic medical record.  These signatures attest to the fact that that the information above on your After Visit Summary has been reviewed and is understood.  Full responsibility of the confidentiality   of this discharge information lies with you and/or your care-partner.    Information on polyps, diverticulosis,and high fiber diet given to you today  Await pathology results

## 2013-06-27 NOTE — Op Note (Addendum)
[facility] [address] [place], [address]   COLONOSCOPY PROCEDURE REPORT  PATIENT: Molly Vang, Molly Vang  MR#: [id number] BIRTHDATE: 1956/03/29 , 56  yrs. old GENDER: Female ENDOSCOPIST: Hart Carwin, MD REFERRED XB:JYNW Arelia Sneddon, M.D. , Dr R.Aronson PROCEDURE DATE:  06/27/2013 PROCEDURE:   Colonoscopy with cold biopsy polypectomy First Screening Colonoscopy - Avg.  risk and is 50 yrs.  old or older - No.  Prior Negative Screening - Now for repeat screening. 10 or more years since last screening  History of Adenoma - Now for follow-up colonoscopy & has been > or = to 3 yrs.  N/A  Polyps Removed Today? Yes. ASA CLASS:   Class II INDICATIONS:Average risk patient for colon cancer and llast colonoscopy September 2004. , family history of colon polyps MEDICATIONS: MAC sedation, administered by CRNA and propofol (Diprivan) 250mg  IV  DESCRIPTION OF PROCEDURE:   After the risks benefits and alternatives of the procedure were thoroughly explained, informed consent was obtained.  A digital rectal exam revealed no abnormalities of the rectum.   The LB PFC-H190 [id number]  endoscope was introduced through the anus and advanced to the cecum, which was identified by both the appendix and ileocecal valve. No adverse events experienced.   The quality of the prep was good, using MoviPrep  The instrument was then slowly withdrawn as the colon was fully examined.      COLON FINDINGS: A smooth sessile polyp ranging between 3-45mm in size was found in the rectum.  A polypectomy was performed with cold forceps.  The resection was complete and the polyp tissue was completely retrieved.there were scattered diverticula through the sigmoid and descending colon  Retroflexed views revealed no abnormalities. The time to cecum=7 minutes 380 seconds.  Withdrawal time=9 minutes 0 seconds.  The scope was withdrawn and the procedure completed. COMPLICATIONS: There were no  complications.  ENDOSCOPIC IMPRESSION: Sessile polyp ranging between 3-77mm in size was found in the rectum; polypectomy was performed with cold forceps mild diverticulosis of the sigmoid colon  RECOMMENDATIONS: 1.  Await pathology results 2.  High fiber diet 3.   recall colonoscopy pending biopsies   eSigned:  Hart Carwin, MD 06/27/2013 10:21 AM Revised: 06/27/2013 10:21 AM  cc:   PATIENT NAME:  Molly, Vang MR#: [id number]

## 2013-06-27 NOTE — Progress Notes (Signed)
Called to room to assist during endoscopic procedure.  Patient ID and intended procedure confirmed with present staff. Received instructions for my participation in the procedure from the performing physician.  

## 2013-06-27 NOTE — Progress Notes (Signed)
Patient did not experience any of the following events: a burn prior to discharge; a fall within the facility; wrong site/side/patient/procedure/implant event; or a hospital transfer or hospital admission upon discharge from the facility. (G8907) Patient did not have preoperative order for IV antibiotic SSI prophylaxis. (G8918)  

## 2013-06-30 ENCOUNTER — Telehealth: Payer: Self-pay | Admitting: *Deleted

## 2013-06-30 NOTE — Telephone Encounter (Signed)
  Follow up Call-  Call back number 06/27/2013  Post procedure Call Back phone  # [phone]  Permission to leave phone message Yes     Patient questions:  Do you have a fever, pain , or abdominal swelling? no Pain Score  0 *  Have you tolerated food without any problems? yes  Have you been able to return to your normal activities? yes  Do you have any questions about your discharge instructions: Diet   no Medications  no Follow up visit  no  Do you have questions or concerns about your Care? no  Actions: * If pain score is 4 or above: No action needed, pain <4.

## 2013-07-01 ENCOUNTER — Encounter: Payer: Self-pay | Admitting: Internal Medicine

## 2013-07-04 ENCOUNTER — Encounter: Payer: Self-pay | Admitting: *Deleted

## 2013-07-10 ENCOUNTER — Ambulatory Visit (INDEPENDENT_AMBULATORY_CARE_PROVIDER_SITE_OTHER): Payer: Federal, State, Local not specified - PPO | Admitting: Internal Medicine

## 2013-07-10 ENCOUNTER — Encounter: Payer: Self-pay | Admitting: Internal Medicine

## 2013-07-10 VITALS — BP 126/79 | HR 71 | Ht 68.0 in | Wt 145.1 lb

## 2013-07-10 DIAGNOSIS — I498 Other specified cardiac arrhythmias: Secondary | ICD-10-CM

## 2013-07-10 NOTE — Progress Notes (Signed)
      HPI Molly Vang returns today for followup. She is a very pleasant 57 yo woman with a h/o SVT who underwent EPS/RFA of AVNRT several weeks ago. In the interim, she has done well. She has rare self limiting palpitations. No syncope or sustained arrhythmias.  No Known Allergies   Current Outpatient Prescriptions  Medication Sig Dispense Refill  . aspirin EC 81 MG tablet Take 81 mg by mouth daily.      . Calcium Carbonate-Vitamin D (CALCIUM 600 + D PO) Take 1 tablet by mouth 2 (two) times daily.      . cetirizine (ZYRTEC) 10 MG tablet Take 10 mg by mouth daily.      . fish oil-omega-3 fatty acids 1000 MG capsule Take 1 g by mouth daily.      . Multiple Vitamin (MULTIVITAMIN) tablet Take 1 tablet by mouth daily.      . naproxen sodium (ANAPROX) 220 MG tablet Take 220 mg by mouth daily as needed (for pain).      . Zinc Sulfate (ZINC 15 PO) Take by mouth as needed.       No current facility-administered medications for this visit.     Past Medical History  Diagnosis Date  . SVT (supraventricular tachycardia) 06/11/2013    svt on 10/6// ablation on 10/15    ROS:   All systems reviewed and negative except as noted in the HPI.   Past Surgical History  Procedure Laterality Date  . Tonsillectomy  1960  . US echocardiography  11/14/11    trace TR,MR  . Supraventricular tachycardia ablation  06/11/2013  . Ovarian cyst surgery  1993     Family History  Problem Relation Age of Onset  . Hypertension Father   . Hyperlipidemia Father   . Hypertension Mother   . Hyperlipidemia Brother   . Colon cancer Neg Hx   . Stomach cancer Neg Hx      History   Social History  . Marital Status: Married    Spouse Name: N/A    Number of Children: N/A  . Years of Education: N/A   Occupational History  . Not on file.   Social History Main Topics  . Smoking status: Never Smoker   . Smokeless tobacco: Never Used  . Alcohol Use: Yes     Comment: 06/11/2013 "beer or glass of wine  maybe once/month"  . Drug Use: No  . Sexual Activity: Yes   Other Topics Concern  . Not on file   Social History Narrative  . No narrative on file     BP 126/79  Pulse 71  Ht 5\' 8"  (1.727 m)  Wt 145 lb 1.9 oz (65.826 kg)  BMI 22.07 kg/m2  Physical Exam:  Well appearing middle aged woman, NAD HEENT: Unremarkable Neck:  No JVD, no thyromegally Back:  No CVA tenderness Lungs:  Clear with no wheezes, rales, or rhonchi HEART:  Regular rate rhythm, no murmurs, no rubs, no clicks Abd:  soft, positive bowel sounds, no organomegally, no rebound, no guarding Ext:  2 plus pulses, no edema, no cyanosis, no clubbing Skin:  No rashes no nodules Neuro:  CN II through XII intact, motor grossly intact  EKG - nsr  Assess/Plan:

## 2013-07-10 NOTE — Patient Instructions (Signed)
Your physician recommends that you schedule a follow-up appointment as needed with Dr Taylor  

## 2013-07-10 NOTE — Assessment & Plan Note (Signed)
She is doing well s/p EPS/RFA of AVNRT. We discussed the importance of continued daily exercise. She will call us if she develops any sustained palpitations. No additional followup is planned.

## 2013-10-16 ENCOUNTER — Other Ambulatory Visit: Payer: Self-pay

## 2013-10-28 ENCOUNTER — Other Ambulatory Visit: Payer: Self-pay | Admitting: Dermatology

## 2014-04-29 ENCOUNTER — Other Ambulatory Visit (HOSPITAL_COMMUNITY): Payer: Self-pay | Admitting: Obstetrics and Gynecology

## 2014-04-29 DIAGNOSIS — Z1231 Encounter for screening mammogram for malignant neoplasm of breast: Secondary | ICD-10-CM

## 2014-06-30 ENCOUNTER — Ambulatory Visit (HOSPITAL_COMMUNITY): Payer: Federal, State, Local not specified - PPO

## 2014-07-02 ENCOUNTER — Ambulatory Visit (HOSPITAL_COMMUNITY): Payer: Federal, State, Local not specified - PPO | Attending: Obstetrics and Gynecology

## 2014-07-14 ENCOUNTER — Other Ambulatory Visit (HOSPITAL_COMMUNITY): Payer: Self-pay | Admitting: Obstetrics and Gynecology

## 2014-07-14 ENCOUNTER — Ambulatory Visit (HOSPITAL_COMMUNITY)
Admission: RE | Admit: 2014-07-14 | Discharge: 2014-07-14 | Disposition: A | Discharge status: Home / Self Care | Payer: Federal, State, Local not specified - PPO | Source: Ambulatory Visit | Attending: Obstetrics and Gynecology | Admitting: Obstetrics and Gynecology

## 2014-07-14 DIAGNOSIS — Z1231 Encounter for screening mammogram for malignant neoplasm of breast: Secondary | ICD-10-CM | POA: Insufficient documentation

## 2014-08-06 ENCOUNTER — Encounter (HOSPITAL_COMMUNITY): Payer: Self-pay | Admitting: Internal Medicine

## 2014-08-28 DIAGNOSIS — C20 Malignant neoplasm of rectum: Secondary | ICD-10-CM

## 2014-08-28 HISTORY — DX: Malignant neoplasm of rectum: C20

## 2014-12-03 ENCOUNTER — Other Ambulatory Visit: Payer: Self-pay | Admitting: Obstetrics and Gynecology

## 2014-12-04 ENCOUNTER — Other Ambulatory Visit (HOSPITAL_COMMUNITY): Payer: Self-pay | Admitting: Obstetrics and Gynecology

## 2014-12-04 DIAGNOSIS — E041 Nontoxic single thyroid nodule: Secondary | ICD-10-CM

## 2014-12-04 LAB — CYTOLOGY - PAP

## 2014-12-08 ENCOUNTER — Ambulatory Visit (HOSPITAL_COMMUNITY): Payer: Federal, State, Local not specified - PPO

## 2015-02-12 ENCOUNTER — Encounter: Payer: Self-pay | Admitting: *Deleted

## 2015-03-17 ENCOUNTER — Encounter: Payer: Self-pay | Admitting: Cardiovascular Disease

## 2015-07-06 ENCOUNTER — Other Ambulatory Visit: Payer: Self-pay

## 2015-07-06 DIAGNOSIS — Z1231 Encounter for screening mammogram for malignant neoplasm of breast: Secondary | ICD-10-CM

## 2015-07-14 ENCOUNTER — Telehealth: Payer: Self-pay | Admitting: Internal Medicine

## 2015-07-14 NOTE — Telephone Encounter (Signed)
Spoke with patient and she would like Dr. Silverio Decamp for new GI. She states starting last week, she has noticed bright, red blood and some mucous in stool. She has been eating a lot of tomatoes recently and thought it was that but it has continued. She also reports slight constipation 2 weeks ago. Denies pain, cramping. Scheduled with Alonza Bogus, PA on 07/20/15 at 2:00 PM.

## 2015-07-20 ENCOUNTER — Ambulatory Visit (INDEPENDENT_AMBULATORY_CARE_PROVIDER_SITE_OTHER): Payer: Federal, State, Local not specified - PPO | Admitting: Gastroenterology

## 2015-07-20 ENCOUNTER — Other Ambulatory Visit (INDEPENDENT_AMBULATORY_CARE_PROVIDER_SITE_OTHER): Payer: Federal, State, Local not specified - PPO

## 2015-07-20 ENCOUNTER — Encounter: Payer: Self-pay | Admitting: Gastroenterology

## 2015-07-20 VITALS — BP 112/82 | HR 84 | Ht 68.0 in | Wt 137.4 lb

## 2015-07-20 DIAGNOSIS — D126 Benign neoplasm of colon, unspecified: Secondary | ICD-10-CM | POA: Diagnosis not present

## 2015-07-20 DIAGNOSIS — K625 Hemorrhage of anus and rectum: Secondary | ICD-10-CM

## 2015-07-20 LAB — HEMOGLOBIN: Hemoglobin: 12.6 g/dL (ref 12.0–15.0)

## 2015-07-20 MED ORDER — HYDROCORTISONE ACETATE 25 MG RE SUPP
25.0000 mg | Freq: Two times a day (BID) | RECTAL | Status: DC
Start: 2015-07-20 — End: 2015-08-17

## 2015-07-20 NOTE — Progress Notes (Signed)
     07/20/2015 Molly Vang [id number] April 10, 1956   History of Present Illness:  This is a 59 year old female who is known to Dr. Olevia Perches previously. Her care will be assumed by Dr. Silverio Decamp in Dr. Nichola Sizer absence.  Her last colonoscopy was in October 2014 at which time she was found have a 3-5 mm sessile polyp in the rectum which was a tubular adenoma.  Also had mild diverticulosis.    She presents to our office today with complaints of rectal bleeding. This is described as bright red colored blood with bowel movements. She did have some constipation recently so was straining to move her bowels.  No other complaints.  Denies rectal/anal discomfort, but does have a history of fissure.     Current Medications, Allergies, Past Medical History, Past Surgical History, Family History and Social History were reviewed in [employer] record.   Physical Exam: BP 112/82 mmHg  Pulse 84  Ht 5\' 8"  (1.727 m)  Wt 137 lb 6.4 oz (62.324 kg)  BMI 20.90 kg/m2 General: Well developed white female in no acute distress Head: Normocephalic and atraumatic Eyes:  Sclerae anicteric, conjunctiva pink  Ears: Normal auditory acuity Lungs: Clear throughout to auscultation Heart: Regular rate and rhythm Abdomen: Soft, non-distended.  Normal bowel sounds.  Non-tender. Rectal:  Small external hemorrhoids noted.  Small non-tender tear/fissure seen on the left but looked to be healing.  DRE did not reveal any mass but there was some brown stool with some bright red blood noted on exam glove.  Anoscopy revealed a swollen deep purple hemorrhoid posteriorly, but no active bleeding seen. Musculoskeletal: Symmetrical with no gross deformities  Extremities: No edema  Neurological: Alert oriented x 4, grossly nonfocal Psychological:  Alert and cooperative. Normal mood and affect  Assessment and Recommendations: -Rectal bleeding:  Likely secondary to internal hemorrhoids seen on exam today, although  there was no sign of active bleeding on anoscopy yet there was blood on exam glove.  Will treat with anusol suppositories twice daily.  Will call back in 2-3 weeks with update of symptoms.  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  Discussed with Dr. Silverio Decamp and we decided that she probably should have colonoscopy repeated.  Discussed with patient's husband on home phone number since patient did not answer cell phone on the evening of 11/22.  Will have my nurse contact patient to schedule procedure.

## 2015-07-20 NOTE — Patient Instructions (Signed)
We have sent the following medications to your pharmacy for you to pick up at your convenience: Anusol HC Suppositories rectally twice a day fo 14 days.   Call back in 2-3 weeks with an update of how you are doing and ask for [facility].

## 2015-07-21 ENCOUNTER — Telehealth: Payer: Self-pay

## 2015-07-21 ENCOUNTER — Other Ambulatory Visit: Payer: Self-pay

## 2015-07-21 DIAGNOSIS — D126 Benign neoplasm of colon, unspecified: Secondary | ICD-10-CM | POA: Insufficient documentation

## 2015-07-21 DIAGNOSIS — R197 Diarrhea, unspecified: Secondary | ICD-10-CM

## 2015-07-21 MED ORDER — NA SULFATE-K SULFATE-MG SULF 17.5-3.13-1.6 GM/177ML PO SOLN: ORAL | Status: DC

## 2015-07-21 MED ORDER — ONDANSETRON 4 MG PO FILM: 1.0000 | ORAL_FILM | Freq: Four times a day (QID) | ORAL | Status: DC | PRN

## 2015-07-21 NOTE — Telephone Encounter (Signed)
Per Alonza Bogus and Dr Silverio Decamp, the patient needs to have a colonoscopy to evaluate her rectal bleeding. Patient is aware of this and agrees to this plan. Due to her scheduling needs, the patient will pick up her instructions to pre- eview them. She will keep the pre-visit appointment to review in detail and ask any questions she may have. She is concerned about nausea during the prep. She previously used a Moviprep and had vomiting.  Patient agrees to the following plan. She will review her instructions once she picks them up from the front desk. She will come to pre-visit on 08/02/15 at 2:30 pm. She will come to the [facility] 08/04/15 arriving at 1:00 pm. Her referral is entered. She will have a prescription for anti-emetic and Suprep at the pharmacy. A coupon for Suprep is provided to help with the cost. Her insurance is [employer].

## 2015-07-21 NOTE — Progress Notes (Signed)
Reviewed and agree with documentation and assessment and plan. K. Veena Nandigam , MD   

## 2015-07-30 ENCOUNTER — Encounter: Payer: Federal, State, Local not specified - PPO | Admitting: Gastroenterology

## 2015-08-02 ENCOUNTER — Telehealth: Payer: Self-pay | Admitting: Gastroenterology

## 2015-08-02 NOTE — Telephone Encounter (Signed)
Verbal Rx given to the [facility] pharmacist. Spoke with Molly Vang to advise her of this. 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 She has no questions about her prep. She will sign her consent forms at the [facility] on her procedure day.

## 2015-08-04 ENCOUNTER — Encounter: Payer: Self-pay | Admitting: Gastroenterology

## 2015-08-04 ENCOUNTER — Other Ambulatory Visit: Payer: Self-pay

## 2015-08-04 ENCOUNTER — Ambulatory Visit (AMBULATORY_SURGERY_CENTER): Payer: Federal, State, Local not specified - PPO | Admitting: Gastroenterology

## 2015-08-04 ENCOUNTER — Other Ambulatory Visit (INDEPENDENT_AMBULATORY_CARE_PROVIDER_SITE_OTHER): Payer: Federal, State, Local not specified - PPO

## 2015-08-04 VITALS — BP 124/84 | HR 101 | Temp 95.8°F | Resp 18 | Ht 68.0 in | Wt 137.0 lb

## 2015-08-04 DIAGNOSIS — Z86010 Personal history of colonic polyps: Secondary | ICD-10-CM

## 2015-08-04 DIAGNOSIS — K921 Melena: Secondary | ICD-10-CM | POA: Diagnosis not present

## 2015-08-04 DIAGNOSIS — K6289 Other specified diseases of anus and rectum: Secondary | ICD-10-CM

## 2015-08-04 DIAGNOSIS — R933 Abnormal findings on diagnostic imaging of other parts of digestive tract: Secondary | ICD-10-CM | POA: Diagnosis not present

## 2015-08-04 DIAGNOSIS — R198 Other specified symptoms and signs involving the digestive system and abdomen: Secondary | ICD-10-CM

## 2015-08-04 DIAGNOSIS — K625 Hemorrhage of anus and rectum: Secondary | ICD-10-CM

## 2015-08-04 DIAGNOSIS — C20 Malignant neoplasm of rectum: Secondary | ICD-10-CM | POA: Diagnosis not present

## 2015-08-04 DIAGNOSIS — D124 Benign neoplasm of descending colon: Secondary | ICD-10-CM

## 2015-08-04 DIAGNOSIS — K635 Polyp of colon: Secondary | ICD-10-CM | POA: Diagnosis not present

## 2015-08-04 LAB — BUN: BUN: 9 mg/dL (ref 6–23)

## 2015-08-04 LAB — CREATININE, SERUM: CREATININE: 0.7 mg/dL (ref 0.40–1.20)

## 2015-08-04 MED ORDER — SODIUM CHLORIDE 0.9 % IV SOLN: 500.0000 mL | INTRAVENOUS | Status: DC

## 2015-08-04 NOTE — Op Note (Signed)
[place]  [employer] [place], [address]   COLONOSCOPY PROCEDURE REPORT  PATIENT: Molly Vang, Molly Vang  MR#: [id number] BIRTHDATE: 09-Feb-1956 , 18  yrs. old GENDER: female ENDOSCOPIST: Harl Bowie, MD REFERRED [id number] Radene Knee, M.D. PROCEDURE DATE:  08/04/2015 PROCEDURE:   Colonoscopy, diagnostic, Colonoscopy, surveillance , and Colonoscopy with biopsy First Screening Colonoscopy - Avg.  risk and is 50 yrs.  old or older - No.  Prior Negative Screening - Now for repeat screening. N/A  History of Adenoma - Now for follow-up colonoscopy & has been > or = to 3 yrs.  No.  It has been less than 3 yrs since last colonoscopy.  Other: See Comments  Polyps removed today? Yes ASA CLASS:   Class II INDICATIONS:Evaluation of unexplained GI bleeding, Surveillance due to prior colonic neoplasia, and PH Colon Adenoma. MEDICATIONS: Propofol 300 mg IV  DESCRIPTION OF PROCEDURE:   After the risks benefits and alternatives of the procedure were thoroughly explained, informed consent was obtained.  The digital rectal exam revealed no abnormalities of the rectum.   The LB [id number]  endoscope was introduced through the anus and advanced to the cecum, which was identified by both the appendix and ileocecal valve. No adverse events experienced.   The quality of the prep was good.  The instrument was then slowly withdrawn as the colon was fully examined. Estimated blood loss is zero unless otherwise noted in this procedure report.    COLON FINDINGS: 5 cm rectal mass starting at 10 cm from anal verge extending to 15 cm, friable ulcerated mucosa concerning for malignancy, multiple random biopsies were obtained.  3 mm polyp in sigmoid colon removed by cold biopsy forceps.  Diverticulosis. Retroflexed views revealed internal hemorrhoids. The time to cecum = 0.7 Withdrawal time = 14.0   The scope was withdrawn and the procedure completed. COMPLICATIONS: There were no immediate  complications.  ENDOSCOPIC IMPRESSION: 5 cm rectal mass starting at 10 cm from anal verge extending to 15 cm, friable ulcerated mucosa concerning for malignancy, multiple random biopsies were obtained.  3 mm polyp in sigmoid colon removed by cold biopsy forceps. Diverticulosis  RECOMMENDATIONS: 1.  Await pathology results 2.  CT chest, abdomen and pelvis with contrast Referral to surgery based on pathology and imaging results Repeat colonoscopy in 6-12 months  eSigned:  Harl Bowie, MD 08/04/2015 2:49 PM

## 2015-08-04 NOTE — Progress Notes (Signed)
A/ox3 pleased with MAC, report to Karen RN 

## 2015-08-04 NOTE — Progress Notes (Signed)
Called to room to assist during endoscopic procedure.  Patient ID and intended procedure confirmed with present staff. Received instructions for my participation in the procedure from the performing physician.  

## 2015-08-04 NOTE — Progress Notes (Signed)
PATIENT TO LAB AT 1600.

## 2015-08-04 NOTE — Patient Instructions (Signed)
DR. NANDIGAM'S OFFICE HAS SCHEDULED YOUR CT SCAN FOR TOMORROW, 08/05/15 AT 3;15. NOTHING TO EAT OR DRINK AFTER 11;30 TOMORROW. DRINK 1ST BOTTLE AT 1;30, 2ND BOTTLE AT 2;30. ARRIVE AT [place] AT 3;15.  HANDOUTS GIVEN FOR SCHEDULING CT SCAN.  AWAIT PATHOLOGY RESULTS.  DR. NANDIGAM'S OFFICE WILL SCHEDULE A SURGICAL CONSULT ONCE RESULTS FROM CT SCAN AND PATHOLOGY.    YOU HAD AN ENDOSCOPIC PROCEDURE TODAY AT [facility]:   Refer to the procedure report that was given to you for any specific questions about what was found during the examination.  If the procedure report does not answer your questions, please call your gastroenterologist to clarify.  If you requested that your care partner not be given the details of your procedure findings, then the procedure report has been included in a sealed envelope for you to review at your convenience later.  YOU SHOULD EXPECT: Some feelings of bloating in the abdomen. Passage of more gas than usual.  Walking can help get rid of the air that was put into your GI tract during the procedure and reduce the bloating. If you had a lower endoscopy (such as a colonoscopy or flexible sigmoidoscopy) you may notice spotting of blood in your stool or on the toilet paper. If you underwent a bowel prep for your procedure, you may not have a normal bowel movement for a few days.  Please Note:  You might notice some irritation and congestion in your nose or some drainage.  This is from the oxygen used during your procedure.  There is no need for concern and it should clear up in a day or so.  SYMPTOMS TO REPORT IMMEDIATELY:   Following lower endoscopy (colonoscopy or flexible sigmoidoscopy):  Excessive amounts of blood in the stool  Significant tenderness or worsening of abdominal pains  Swelling of the abdomen that is new, acute  Fever of 100F or higher   For urgent or emergent issues, a gastroenterologist can be reached at any hour by  calling [phone].   DIET: Your first meal following the procedure should be a small meal and then it is ok to progress to your normal diet. Heavy or fried foods are harder to digest and may make you feel nauseous or bloated.  Likewise, meals heavy in dairy and vegetables can increase bloating.  Drink plenty of fluids but you should avoid alcoholic beverages for 24 hours.  ACTIVITY:  You should plan to take it easy for the rest of today and you should NOT DRIVE or use heavy machinery until tomorrow (because of the sedation medicines used during the test).    FOLLOW UP: Our staff will call the number listed on your records the next business day following your procedure to check on you and address any questions or concerns that you may have regarding the information given to you following your procedure. If we do not reach you, we will leave a message.  However, if you are feeling well and you are not experiencing any problems, there is no need to return our call.  We will assume that you have returned to your regular daily activities without incident.  If any biopsies were taken you will be contacted by phone or by letter within the next 1-3 weeks.  Please call us at [phone] if you have not heard about the biopsies in 3 weeks.    SIGNATURES/CONFIDENTIALITY: You and/or your care partner have signed paperwork which will be entered into your  electronic medical record.  These signatures attest to the fact that that the information above on your After Visit Summary has been reviewed and is understood.  Full responsibility of the confidentiality of this discharge information lies with you and/or your care-partner.

## 2015-08-05 ENCOUNTER — Ambulatory Visit (HOSPITAL_COMMUNITY)
Admission: RE | Admit: 2015-08-05 | Discharge: 2015-08-05 | Disposition: A | Discharge status: Home / Self Care | Payer: Federal, State, Local not specified - PPO | Source: Ambulatory Visit | Attending: Gastroenterology | Admitting: Gastroenterology

## 2015-08-05 ENCOUNTER — Telehealth: Payer: Self-pay

## 2015-08-05 ENCOUNTER — Encounter (HOSPITAL_COMMUNITY): Payer: Self-pay

## 2015-08-05 DIAGNOSIS — K625 Hemorrhage of anus and rectum: Secondary | ICD-10-CM | POA: Diagnosis not present

## 2015-08-05 DIAGNOSIS — K629 Disease of anus and rectum, unspecified: Secondary | ICD-10-CM | POA: Diagnosis present

## 2015-08-05 DIAGNOSIS — M5137 Other intervertebral disc degeneration, lumbosacral region: Secondary | ICD-10-CM | POA: Diagnosis not present

## 2015-08-05 DIAGNOSIS — K6289 Other specified diseases of anus and rectum: Secondary | ICD-10-CM

## 2015-08-05 MED ORDER — IOHEXOL 300 MG/ML  SOLN
100.0000 mL | Freq: Once | INTRAMUSCULAR | Status: AC | PRN
  Administered 2015-08-05: 100 mL via INTRAVENOUS

## 2015-08-05 NOTE — Telephone Encounter (Signed)
  Follow up Call-  Call back number 08/04/2015 06/27/2013  Post procedure Call Back phone  # [phone] hm [id number]  Permission to leave phone message Yes Yes     Patient questions:  Do you have a fever, pain , or abdominal swelling? No. Pain Score  0 *  Have you tolerated food without any problems? Yes.    Have you been able to return to your normal activities? Yes.    Do you have any questions about your discharge instructions: Diet   No. Medications  No. Follow up visit  No.  Do you have questions or concerns about your Care? No.  Actions: * If pain score is 4 or above: No action needed, pain <4.

## 2015-08-06 ENCOUNTER — Telehealth: Payer: Self-pay | Admitting: *Deleted

## 2015-08-06 ENCOUNTER — Other Ambulatory Visit: Payer: Self-pay

## 2015-08-06 ENCOUNTER — Other Ambulatory Visit: Payer: Self-pay | Admitting: *Deleted

## 2015-08-06 DIAGNOSIS — C189 Malignant neoplasm of colon, unspecified: Secondary | ICD-10-CM

## 2015-08-06 DIAGNOSIS — C20 Malignant neoplasm of rectum: Secondary | ICD-10-CM

## 2015-08-06 NOTE — Telephone Encounter (Signed)
Oncology Nurse Navigator Documentation  Oncology Nurse Navigator Flowsheets 08/06/2015  Referral date to RadOnc/MedOnc 08/06/2015  Navigator Encounter Type Introductory phone call  Interventions Other-Reviewed flow of clinic and briefly reviewed treatment for rectal cancer  Time Spent with Patient 75  Spoke with patient and provided new patient appointment for 08/13/15 at 0845/0900--she will see Dr. Burr Medico, Dr. Lisbeth Renshaw and Dr. Marcello Moores in GI [place] as well as dietician and CSW. Informed of location of [facility], valet service, and registration process. Reminded to bring insurance cards and a current medication list, including supplements. Patient verbalizes understanding. Records/demographics sent to [facility]

## 2015-08-09 ENCOUNTER — Telehealth: Payer: Self-pay | Admitting: Gastroenterology

## 2015-08-09 NOTE — Telephone Encounter (Signed)
The patient has many questions about her new diagnosis. I have encouraged her to write the questions down. Most of them will be answered at the oncology appointment. I have answered what I have information on from her chart.

## 2015-08-10 ENCOUNTER — Ambulatory Visit
Admission: RE | Admit: 2015-08-10 | Discharge: 2015-08-10 | Disposition: A | Discharge status: Home / Self Care | Payer: Federal, State, Local not specified - PPO | Source: Ambulatory Visit

## 2015-08-10 ENCOUNTER — Telehealth: Payer: Self-pay | Admitting: *Deleted

## 2015-08-10 DIAGNOSIS — Z1231 Encounter for screening mammogram for malignant neoplasm of breast: Secondary | ICD-10-CM

## 2015-08-10 NOTE — Telephone Encounter (Signed)
Oncology Nurse Navigator Documentation  Oncology Nurse Navigator Flowsheets 08/10/2015  Referral date to RadOnc/MedOnc -  Navigator Encounter Type Telephone  Barriers/Navigation Needs Education-  Education Verbal  Interventions -  Time Spent with Patient 15  Called with few questions: 1) OK to have mammogram today--YES, 2) Will cancer spread if we don't get tx started to end of December?--Cancer has been there many months, a few weeks is not anything to worry about, 3) What stage is my cancer?--Undetermined at this time, but looks like early stage that is curable; 4) Should her husband come to appointment?--YES; 5) How many days a week is the RT/chemo?--Five. Questions answered. Confirmed her arrival time of 0845 to appointment despite any other time given by automated reminder.

## 2015-08-13 ENCOUNTER — Ambulatory Visit: Payer: Federal, State, Local not specified - PPO | Admitting: Nutrition

## 2015-08-13 ENCOUNTER — Inpatient Hospital Stay
Admission: RE | Admit: 2015-08-13 | Discharge: 2015-08-13 | Disposition: A | Discharge status: Home / Self Care | Payer: Self-pay | Source: Ambulatory Visit | Admitting: Radiation Oncology

## 2015-08-13 ENCOUNTER — Ambulatory Visit (HOSPITAL_BASED_OUTPATIENT_CLINIC_OR_DEPARTMENT_OTHER): Payer: Federal, State, Local not specified - PPO | Admitting: Hematology

## 2015-08-13 ENCOUNTER — Encounter: Payer: Self-pay | Admitting: *Deleted

## 2015-08-13 ENCOUNTER — Telehealth: Payer: Self-pay

## 2015-08-13 ENCOUNTER — Encounter: Payer: Self-pay | Admitting: Hematology

## 2015-08-13 ENCOUNTER — Ambulatory Visit
Admission: RE | Admit: 2015-08-13 | Discharge: 2015-08-13 | Disposition: A | Discharge status: Home / Self Care | Payer: Federal, State, Local not specified - PPO | Source: Ambulatory Visit | Attending: Radiation Oncology | Admitting: Radiation Oncology

## 2015-08-13 ENCOUNTER — Ambulatory Visit (HOSPITAL_BASED_OUTPATIENT_CLINIC_OR_DEPARTMENT_OTHER): Payer: Federal, State, Local not specified - PPO

## 2015-08-13 VITALS — BP 132/80 | HR 70 | Temp 98.0°F | Resp 17 | Ht 68.0 in | Wt 133.8 lb

## 2015-08-13 DIAGNOSIS — C20 Malignant neoplasm of rectum: Secondary | ICD-10-CM

## 2015-08-13 DIAGNOSIS — Z85828 Personal history of other malignant neoplasm of skin: Secondary | ICD-10-CM | POA: Diagnosis not present

## 2015-08-13 DIAGNOSIS — Z85820 Personal history of malignant melanoma of skin: Secondary | ICD-10-CM

## 2015-08-13 NOTE — Telephone Encounter (Signed)
Left message on machine to call back  

## 2015-08-13 NOTE — Progress Notes (Signed)
Radiation Oncology         [phone] ________________________________  Name: Molly Vang MRN: [id number]  Date: 08/13/2015  DOB: 14-Jan-1956  [id number] S, MD  Arvella Nigh, MD     REFERRING PHYSICIAN: Arvella Nigh, MD   DIAGNOSIS: The encounter diagnosis was Rectal adenocarcinoma [facility]).    ICD-9-CM ICD-10-CM   1. 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 female who is seen for an initial consultation visit. She presented to Gastroeneterology on 07/20/15 for a 1 week history of bright, red blood and mucous in her stool and constipation for two weeks prior. External hemorrhoids were noted and a small non-tender tear/fissure was seen on the left. Brown stool with some bright red blood noted on the exam glove. Anoscopy revealed a swollen, deep, purple hemorrhoid posteriorly, but no active bleeding was seen. She was advised to use anusol suppositories bid. Colonoscopy on 08/04/15 by Dr. Silverio Decamp found a 5 cm rectal mass starting 10 cm from the anal verge extending to 15 cm. The friable ulcerated mucosa was concerning for malignancy. Multiple random biopsies were obtained. A 3 mm polyp in the sigmoid colon was also removed and diverticulosis was noted. The rectal mass was positive for adenocarcinoma. On 08/06/15, she had a CT of the chest, abdomen, and pelvis with contrast that showed a possible area of rectal wall thickening anterior to the distal sacrum. There was no adenopathy or distal metastasis. The patient presents to GI clinic today for options regarding the management of her disease.  In October 2014, she had a colonoscopy by Dr. Olevia Perches. A 3-5 mm smooth, sessile, polyp was found. Pathology revealed fragments of tubular adenoma (X1) with high grade dysplasia, negative for invasive malignancy.  PREVIOUS RADIATION THERAPY: No   PAST MEDICAL HISTORY:   has a past medical history of SVT (supraventricular tachycardia) ([place]) (06/11/2013) and Cancer ([place]).     PAST SURGICAL HISTORY: Past Surgical History  Procedure Laterality Date  . Tonsillectomy  1960  . US echocardiography  11/14/11    trace TR,MR  . Supraventricular tachycardia ablation  06/11/2013  . Ovarian cyst surgery  1993  . Supraventricular tachycardia ablation N/A 06/11/2013    Procedure: SUPRAVENTRICULAR TACHYCARDIA ABLATION;  Surgeon: Evans Lance, MD;  Location: [facility] CATH LAB;  Service: Cardiovascular;  Laterality: N/A;  . Nm myocar perf wall motion  12/14/2010    normal  . Colonoscopy       FAMILY HISTORY: family history includes Hyperlipidemia in her brother and father; Hypertension in her father and mother. There is no history of Colon cancer, Stomach cancer, Esophageal cancer, or Rectal cancer.   SOCIAL HISTORY:  reports that she has never smoked. She has never used smokeless tobacco. She reports that she drinks alcohol. She reports that she does not use illicit drugs.   ALLERGIES: Review of patient's allergies indicates no known allergies.   MEDICATIONS:  Current Outpatient Prescriptions  Medication Sig Dispense Refill  . aspirin EC 81 MG tablet Take 81 mg by mouth daily.    . Calcium Carbonate-Vitamin D (CALCIUM 600 + D PO) Take 1 tablet by mouth daily. Vit. D3 is 400iu    . chlorhexidine (PERIDEX) 0.12 % solution RINSE TWICE DAILY FOR 30 SECONDS AND THEN SPIT OUT AS DIRECTED  98  . chlorpheniramine (CHLOR-TRIMETON) 4 MG tablet Take 4 mg by mouth 2 (two) times daily as needed  for allergies.    . fish oil-omega-3 fatty acids 1000 MG capsule Take 1 g by mouth daily. EPA 570mg   And DHA 130mg     . hydrocortisone (ANUSOL-HC) 25 MG suppository Place 1 suppository (25 mg total) rectally 2 (two) times daily. (Patient taking differently: Place 25 mg rectally 2 (two) times daily as needed. ) 28 suppository 1  . Multiple Vitamin (MULTIVITAMIN) tablet Take 1 tablet by mouth  daily.    . naproxen sodium (ANAPROX) 220 MG tablet Take 220 mg by mouth daily as needed (for pain).    . Zinc Sulfate (ZINC 15 PO) Take by mouth as needed.    . zolpidem (AMBIEN) 10 MG tablet 5 mg at bedtime as needed. Takes 1/2 10mg  tablet     No current facility-administered medications for this encounter.     REVIEW OF SYSTEMS:  A 15 point review of systems is documented in the electronic medical record. This was obtained by the nursing staff. However, I reviewed this with the patient to discuss relevant findings and make appropriate changes.  Pertinent items are noted in HPI.  She denies pelvic pain. She states her health is excellent at this time.  PHYSICAL EXAM:  General: Well-developed, in no acute distress HEENT: Normocephalic, atraumatic; oral cavity clear Neck: Supple without any lymphadenopathy Cardiovascular: Regular rate and rhythm Respiratory: Clear to auscultation bilaterally GI: Soft, nontender, normal bowel sounds Extremities: No edema present Neuro: No focal deficits Rectal Exam: No external lesions. No tumor palpable on digital rectal exam. No active bleeding.  ECOG = 1  0 - Asymptomatic (Fully active, able to carry on all predisease activities without restriction)  1 - Symptomatic but completely ambulatory (Restricted in physically strenuous activity but ambulatory and able to carry out work of a light or sedentary nature. For example, light housework, office work)  2 - Symptomatic, <50% in bed during the day (Ambulatory and capable of all self care but unable to carry out any work activities. Up and about more than 50% of waking hours)  3 - Symptomatic, >50% in bed, but not bedbound (Capable of only limited self-care, confined to bed or chair 50% or more of waking hours)  4 - Bedbound (Completely disabled. Cannot carry on any self-care. Totally confined to bed or chair)  5 - Death   Eustace Pen MM, Creech RH, Tormey DC, et al. [phone]). "Toxicity and response  criteria of the [facility]". [place] Oncol. 5 (6): 649-55     LABORATORY DATA:  Lab Results  Component Value Date   WBC 9.2 06/03/2013   HGB 12.6 07/20/2015   HCT 37.9 06/03/2013   MCV 89.0 06/03/2013   PLT 300.0 06/03/2013   Lab Results  Component Value Date   NA 140 06/03/2013   K 3.7 06/03/2013   CL 105 06/03/2013   CO2 29 06/03/2013   Lab Results  Component Value Date   ALT 27 05/19/2013   AST 33 05/19/2013   ALKPHOS 83 05/19/2013   BILITOT 0.2* 05/19/2013      RADIOGRAPHY: Ct Chest W Contrast  08/06/2015  CLINICAL DATA:  Rectal mass on colonoscopy performed yesterday. Blood in stools. Initial encounter. EXAM: CT CHEST, ABDOMEN, AND PELVIS WITH CONTRAST TECHNIQUE: Multidetector CT imaging of the chest, abdomen and pelvis was performed following the standard protocol during bolus administration of intravenous contrast. CONTRAST:  113mL OMNIPAQUE IOHEXOL 300 MG/ML  SOLN COMPARISON:  None. FINDINGS: CT CHEST FINDINGS Mediastinum/Nodes: There are no enlarged mediastinal, hilar or axillary  lymph nodes. The thyroid gland, trachea and esophagus demonstrate no significant findings. The heart size is normal. There is no pericardial effusion. There are no significant vascular findings. Lungs/Pleura: There is no pleural effusion.The lungs are clear. Musculoskeletal/Chest wall: No chest wall mass or suspicious osseous findings. CT ABDOMEN AND PELVIS FINDINGS Hepatobiliary: The liver demonstrates no suspicious findings. There is focal fat adjacent to the falciform ligament and a 9 mm cyst in the lateral segment of the left lobe. There is probable layering sludge within the gallbladder lumen. No evidence of calcified gallstones, gallbladder wall thickening or biliary dilatation. Pancreas: Unremarkable. No pancreatic ductal dilatation or surrounding inflammatory changes. Spleen: Normal in size without focal abnormality. Adrenals/Urinary Tract: Both adrenal glands  appear normal. The kidneys appear normal without evidence of urinary tract calculus, suspicious lesion or hydronephrosis. No bladder abnormalities are seen. Stomach/Bowel: There is a 2 cm segment of circumferential rectal wall thickening anterior to the distal sacrum (best seen on coronal image 64) which could be the reported abnormality on colonoscopy as correlated with that report. There is asymmetric 3.2 x 2.6 cm soft tissue thickening involving the posterior right wall of the distal rectum at the anal verge (image 120). No other bowel wall thickening, distention or surrounding inflammatory change identified. The appendix appears normal. Vascular/Lymphatic: There are no enlarged abdominal or pelvic lymph nodes. No significant vascular findings are present. Reproductive: Unremarkable. Other: No ascites or peritoneal nodularity. Musculoskeletal: No acute or significant osseous findings. 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 IMPRESSION: 1. The patient's rectal mass is not well visualized. There is a possible area of circumferential rectal wall thickening anterior to the distal sacrum which may correspond with the reported abnormality on colonoscopy. There is an additional area of asymmetric soft tissue thickening along the posterior right wall of the distal rectum. 2. No evidence of bowel obstruction or metastatic disease. 3. Negative chest CT. Electronically Signed   By: Richardean Sale M.D.   On: 08/06/2015 08:31   Ct Abdomen Pelvis W Contrast  08/06/2015  CLINICAL DATA:  Rectal mass on colonoscopy performed yesterday. Blood in stools. Initial encounter. EXAM: CT CHEST, ABDOMEN, AND PELVIS WITH CONTRAST TECHNIQUE: Multidetector CT imaging of the chest, abdomen and pelvis was performed following the standard protocol during bolus administration of intravenous contrast. CONTRAST:  166mL OMNIPAQUE IOHEXOL 300 MG/ML  SOLN COMPARISON:  None. FINDINGS: CT CHEST FINDINGS Mediastinum/Nodes: There are no enlarged  mediastinal, hilar or axillary lymph nodes. The thyroid gland, trachea and esophagus demonstrate no significant findings. The heart size is normal. There is no pericardial effusion. There are no significant vascular findings. Lungs/Pleura: There is no pleural effusion.The lungs are clear. Musculoskeletal/Chest wall: No chest wall mass or suspicious osseous findings. CT ABDOMEN AND PELVIS FINDINGS Hepatobiliary: The liver demonstrates no suspicious findings. There is focal fat adjacent to the falciform ligament and a 9 mm cyst in the lateral segment of the left lobe. There is probable layering sludge within the gallbladder lumen. No evidence of calcified gallstones, gallbladder wall thickening or biliary dilatation. Pancreas: Unremarkable. No pancreatic ductal dilatation or surrounding inflammatory changes. Spleen: Normal in size without focal abnormality. Adrenals/Urinary Tract: Both adrenal glands appear normal. The kidneys appear normal without evidence of urinary tract calculus, suspicious lesion or hydronephrosis. No bladder abnormalities are seen. Stomach/Bowel: There is a 2 cm segment of circumferential rectal wall thickening anterior to the distal sacrum (best seen on coronal image 64) which could be the reported abnormality on colonoscopy as correlated with that report. There is  asymmetric 3.2 x 2.6 cm soft tissue thickening involving the posterior right wall of the distal rectum at the anal verge (image 120). No other bowel wall thickening, distention or surrounding inflammatory change identified. The appendix appears normal. Vascular/Lymphatic: There are no enlarged abdominal or pelvic lymph nodes. No significant vascular findings are present. Reproductive: Unremarkable. Other: No ascites or peritoneal nodularity. Musculoskeletal: No acute or significant osseous findings. 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 IMPRESSION: 1. The patient's rectal mass is not well visualized. There is a possible area of  circumferential rectal wall thickening anterior to the distal sacrum which may correspond with the reported abnormality on colonoscopy. There is an additional area of asymmetric soft tissue thickening along the posterior right wall of the distal rectum. 2. No evidence of bowel obstruction or metastatic disease. 3. Negative chest CT. Electronically Signed   By: Richardean Sale M.D.   On: 08/06/2015 08:31   Mm Screening Breast Tomo Bilateral  08/11/2015  CLINICAL DATA:  Screening. EXAM: DIGITAL SCREENING BILATERAL MAMMOGRAM WITH 3D TOMO WITH CAD COMPARISON:  Previous exam(s). ACR Breast Density Category c: The breast tissue is heterogeneously dense, which may obscure small masses. FINDINGS: There are no findings suspicious for malignancy. Images were processed with CAD. IMPRESSION: No mammographic evidence of malignancy. A result letter of this screening mammogram will be mailed directly to the patient. RECOMMENDATION: Screening mammogram in one year. (Code:SM-B-01Y) BI-RADS CATEGORY  1: Negative. Electronically Signed   By: Lajean Manes M.D.   On: 08/11/2015 13:26       IMPRESSION: Rectal Cancer (staging pending at this time). No regional or distant disease seen thus far during workup.  We will pursue further staging studies. If the patient is found to have early T1-2 N0 disease then the patient will proceed with surgery of front. Otherwise with more advanced disease, the patient will proceed with initial chemoradiation treatment. We discussed possible further staging with endoscopic ultrasound versus a pelvic MRI scan. The tumor will require being tattooed which is one factor.   PLAN: We discussed that she will get an endoscopic ultrasound to tattoo the tumor location and for staging purposes in the near future. I spoke to the patient today regarding her diagnosis and options for treatment. We discussed the process of CT simulation and the placement tattoos. We discussed 5-1/2 weeks of chemoradiation  treatment as an outpatient. We discussed the possible side effects including but not limited to skin irritation and fatigue.    ________________________________   Jodelle Gross, MD, PhD  This document serves as a record of services personally performed by Kyung Rudd, MD. It was created on his behalf by Darcus Austin, a trained medical scribe. The creation of this record is based on the scribe's personal observations and the provider's statements to them. This document has been checked and approved by the attending provider.

## 2015-08-13 NOTE — Progress Notes (Signed)
Patient was seen in GI clinic.  59 year old female diagnosed with rectal cancer.  She is a patient of Dr. Burr Medico.  Past medical history includes SVT and melanoma.  Medications include calcium, fish oil, and multivitamin.  Labs were reviewed.  Height: 68 inches. Weight: 133.8 pounds. Usual body weight: 135 pounds per patient. BMI: 20.35.  Patient reports she is waiting on a plan for her rectal cancer. She needs further testing and workup Patient reports she eats healthy diet high in fruits and vegetables and grains. Patient does consume protein foods in the form of meat and dairy.  Nutrition diagnosis:  Food and nutrition related knowledge deficit related to new diagnosis of rectal cancer as evidenced by no prior need for nutrition related information.  Intervention:  Educated patient to consume small frequent meals and snacks with adequate calories and protein to minimize weight loss. Reviewed high protein food choices. Provided fact sheet on increasing calories and protein. Discussed potential side effects with chemotherapy and radiation but stated we will review therapeutic diet as needed for nutrition impact symptoms. Questions were answered.  Teach back method used.  Contact information was provided.  Monitoring, evaluation, goals: Patient will tolerate adequate calories and protein to minimize weight loss to maintain lean body mass.  Next visit: To be scheduled as needed.  **Disclaimer: This note was dictated with voice recognition software. Similar sounding words can inadvertently be transcribed and this note may contain transcription errors which may not have been corrected upon publication of note.**

## 2015-08-13 NOTE — Progress Notes (Signed)
[place] Psychosocial Distress Screening Clinical Social Work  Clinical Social Work met with pt and her husband, Molly Vang at [employer] to introduce self, explain role of CSW/Pt and Family Support Team and review the distress screening protocol. The patient scored a 7 on the Psychosocial Distress Thermometer which indicates severe distress. Pt feels her distress is still a 7 after meeting with the medical team today. Pt expressed concerns about how to talk with her adult children about her diagnosis. CSW and pt processed this some today. Pt reports to have good support from friends, family and her church. Pt shared she can get a "little anxious and excitable" at times, especially related to her diagnosis. Pt appears to have good coping techniques that work for her. She likes to walk alone and receives great comfort from this time. Pt is not open to GI Group or additional support programs currently, but appreciated learning of the options. Pt and husband aware of how to reach out to CSW as needed.  ONCBCN DISTRESS SCREENING 08/13/2015  Screening Type Initial Screening  Distress experienced in past week (1-10) 7  Referral to clinical social work Yes  Referral to support programs Yes    Clinical Social Worker follow up needed: No.  If yes, follow up plan: Molly Vang, [place]  [facility] Phone: [phone] Fax: [phone]

## 2015-08-13 NOTE — Progress Notes (Signed)
Oncology Nurse Navigator Documentation  Oncology Nurse Navigator Flowsheets 08/13/2015  Referral date to RadOnc/MedOnc -  Navigator Encounter Type Clinic/MDC  Patient Visit Type Medonc;Radonc;Surgery  Treatment Phase Treatment planning  Barriers/Navigation Needs Family concerns;Education  Education Understanding Cancer/ Treatment Options;Coping with Diagnosis/ Prognosis;Newly Diagnosed Cancer Education;Preparing for Upcoming Surgery/ Treatment  Interventions Other-seen by CSW and dietician today  Time Spent with Patient 60  Met with patient and husband, Molly Vang during new patient visit. Explained the role of the GI Nurse Navigator and provided New Patient Packet with information on: 1. Rectal cancer, chemotherapy basis, SIM handout, Xeloda information 2. Support groups 3. Advanced Directives 4. Fall Safety Plan Answered questions, reviewed current treatment plan using TEACH back and provided emotional support. Provided copy of current treatment plan. [name] is quite anxious and wants treatment started as soon as possible. Requesting to know dates of procedures/treatment by time she leaves clinic today. Explained that all this is a process and it will be scheduled very soon. Understands that if EUS shows her tumor is T2 or less, she will proceed with surgery prior to other treatment, which will depend on the final surgical path results. Her husband, who is chief of security at [facility] [employer] has a good calming effect on her. Promised her she will be called by [facility] very soon and she will be notified of her lab results on 08/16/15.  Merceda Elks, RN, BSN GI On[facility] [place]

## 2015-08-13 NOTE — Telephone Encounter (Signed)
-----   Message from Milus Banister, MD sent at 08/13/2015  9:31 AM EST ----- Regarding: RE: Lower EUS All We'll get the EUS scheduled.  Likely for next week.  I'll see about FNA of the very distal perirectal soft tissue mass that appears exernal to rectum at same time.  Thanks   Dannah Ryles, She needs lower EUS, radial +/- linear, next Thursday +MAC for newly diagnosed rectal cancer.  Thanks     ----- Message -----    From: Tania Ade, RN    Sent: 08/13/2015   9:15 AM      To: Milus Banister, MD, Truitt Merle, MD, # Subject: Lower EUS                                      Dr. Ardis Hughs, Seen in [facility] [place] today (referral from Dr. Silverio Decamp). Dr. Burr Medico requesting lower EUS as soon as possible for rectal cancer staging.   Thanks, Cristy Friedlander.

## 2015-08-13 NOTE — Progress Notes (Addendum)
Molly Vang  [phone] [phone]  Clinic New Consult Note   Patient Care Team: Arvella Nigh, MD as PCP - General (Obstetrics and Gynecology) 08/13/2015  Referral physician: Dr. Silverio Decamp   CHIEF COMPLAINTS/PURPOSE OF CONSULTATION:  Newly diagnosed rectal cancer     Rectal adenocarcinoma ([place])   08/04/2015 Initial Biopsy rectal mass adenocarcinoma, descending colon polyp hyperplastic polyp.    08/04/2015 Procedure 5 cm rectal mass starting at 10 cm from anal verge extending to 15 cm, 68mm sigmoid polyp was removed    08/06/2015 Imaging CT chest, abdomen and pelvis w contrast showed a possible area of rectal wall thickening aterior to the distal sacrum, no adenopathy or distant metastasis    08/13/2015 Initial Diagnosis Rectal adenocarcinoma ([facility])    HISTORY OF PRESENTING ILLNESS:  Molly Vang 59 y.o. female is here because of newly diagnosed rectal adenocarcinoma. She is accompanied by her husband to our multidisciplinary GI clinic today.  She had a colonoscopy in 05/2013 by Dr. Olevia Perches which showed a 3-45mm sessile rectal polyp, which was removed, pathology revealed tubular adenoma. She has had intermittent rectal bleeding since Oct 2016, mild, she had mild constipation, no pain, nause or change of apeptie, no weihgt loss. She called Dr. Nichola Sizer office, who has recently retired, and was seen by Dr. Silverio Decamp. She underwent colonoscopy on 08/04/2015, which showed a 5 cm rectal mass starting at a 10 cm from an approach extending to 15 cm, and a 3 mm sigmoid polyp which was removed. The rectal mass biopsy showed adenocarcinoma. CT of the chest abdomen and pelvis with contrast was performed on 08/06/2015, which showed no adenopathy or distant metastasis.  She otherwise feels well, no other complains. She is a homemaker, also works as a English as a second language teacher. She eats healthy diet, and walks 5 miles a day, very physically active.   She had a early stage skin  melanoma on her right arm, was removed a few years ago. She also has a basal cell skin cancer on her face, will need a resection soon. She otherwise is very healthy. No family history of colon cancer or other malignancy.   MEDICAL HISTORY:  Past Medical History  Diagnosis Date  . SVT (supraventricular tachycardia) ([place]) 06/11/2013    svt on 10/6// ablation on 10/15  . Cancer ([place])     melanoma right bicep removed    SURGICAL HISTORY: Past Surgical History  Procedure Laterality Date  . Tonsillectomy  1960  . US echocardiography  11/14/11    trace TR,MR  . Supraventricular tachycardia ablation  06/11/2013  . Ovarian cyst surgery  1993  . Supraventricular tachycardia ablation N/A 06/11/2013    Procedure: SUPRAVENTRICULAR TACHYCARDIA ABLATION;  Surgeon: Evans Lance, MD;  Location: [facility] CATH LAB;  Service: Cardiovascular;  Laterality: N/A;  . Nm myocar perf wall motion  12/14/2010    normal  . Colonoscopy      SOCIAL HISTORY: Social History   Social History  . Marital Status: Married    Spouse Name: N/A  . Number of Children: 2   . Years of Education: N/A   Occupational History  . She is a Materials engineer    Social History Main Topics  . Smoking status: Never Smoker   . Smokeless tobacco: Never Used  . Alcohol Use: Yes     Comment: 06/11/2013 "beer or glass of wine maybe once/month"  . Drug Use: No  . Sexual Activity: Yes   Other Topics Concern  . Not on file  Social History Narrative    FAMILY HISTORY: Family History  Problem Relation Age of Onset  . Hypertension Father   . Hyperlipidemia Father   . Hypertension Mother   . Hyperlipidemia Brother   . Colon cancer Neg Hx   . Stomach cancer Neg Hx   . Esophageal cancer Neg Hx   . Rectal cancer Neg Hx     ALLERGIES:  has No Known Allergies.  MEDICATIONS:  Current Outpatient Prescriptions  Medication Sig Dispense Refill  . aspirin EC 81 MG tablet Take 81 mg by mouth daily.    . Calcium Carbonate-Vitamin D  (CALCIUM 600 + D PO) Take 1 tablet by mouth daily. Vit. D3 is 400iu    . chlorhexidine (PERIDEX) 0.12 % solution RINSE TWICE DAILY FOR 30 SECONDS AND THEN SPIT OUT AS DIRECTED  98  . chlorpheniramine (CHLOR-TRIMETON) 4 MG tablet Take 4 mg by mouth 2 (two) times daily as needed for allergies.    . fish oil-omega-3 fatty acids 1000 MG capsule Take 1 g by mouth daily. EPA 570mg   And DHA 130mg     . hydrocortisone (ANUSOL-HC) 25 MG suppository Place 1 suppository (25 mg total) rectally 2 (two) times daily. (Patient taking differently: Place 25 mg rectally 2 (two) times daily as needed. ) 28 suppository 1  . Multiple Vitamin (MULTIVITAMIN) tablet Take 1 tablet by mouth daily.    . naproxen sodium (ANAPROX) 220 MG tablet Take 220 mg by mouth daily as needed (for pain).    . Zinc Sulfate (ZINC 15 PO) Take by mouth as needed.    . zolpidem (AMBIEN) 10 MG tablet 5 mg at bedtime as needed. Takes 1/2 10mg  tablet     No current facility-administered medications for this visit.    REVIEW OF SYSTEMS:   Constitutional: Denies fevers, chills or abnormal night sweats Eyes: Denies blurriness of vision, double vision or watery eyes Ears, nose, mouth, throat, and face: Denies mucositis or sore throat Respiratory: Denies cough, dyspnea or wheezes Cardiovascular: Denies palpitation, chest discomfort or lower extremity swelling Gastrointestinal:  Denies nausea, heartburn or change in bowel habits Skin: Denies abnormal skin rashes Lymphatics: Denies new lymphadenopathy or easy bruising Neurological:Denies numbness, tingling or new weaknesses Behavioral/Psych: Mood is stable, no new changes  All other systems were reviewed with the patient and are negative.  PHYSICAL EXAMINATION: ECOG PERFORMANCE STATUS: 0 - Asymptomatic  Filed Vitals:   08/13/15 0859  BP: 132/80  Pulse: 70  Temp: 98 F (36.7 C)  Resp: 17   Filed Weights   08/13/15 0859  Weight: 133 lb 12.8 oz (60.691 kg)    GENERAL:alert, no  distress and comfortable SKIN: skin color, texture, turgor are normal, no rashes or significant lesions EYES: normal, conjunctiva are pink and non-injected, sclera clear OROPHARYNX:no exudate, no erythema and lips, buccal mucosa, and tongue normal  NECK: supple, thyroid normal size, non-tender, without nodularity LYMPH:  no palpable lymphadenopathy in the cervical, axillary or inguinal LUNGS: clear to auscultation and percussion with normal breathing effort HEART: regular rate & rhythm and no murmurs and no lower extremity edema ABDOMEN:abdomen soft, non-tender and normal bowel sounds Musculoskeletal:no cyanosis of digits and no clubbing  PSYCH: alert & oriented x 3 with fluent speech NEURO: no focal motor/sensory deficits  LABORATORY DATA:  I have reviewed the data as listed CBC Latest Ref Rng 07/20/2015 06/03/2013 05/19/2013  WBC 4.5 - 10.5 K/uL - 9.2 8.8  Hemoglobin 12.0 - 15.0 g/dL 12.6 12.8 12.9  Hematocrit 36.0 - 46.0 % -  37.9 37.5  Platelets 150.0 - 400.0 K/uL - 300.0 296    CMP Latest Ref Rng 08/04/2015 06/03/2013 05/19/2013  Glucose 70 - 99 mg/dL - 87 104(H)  BUN 6 - 23 mg/dL 9 12 14   Creatinine 0.40 - 1.20 mg/dL 0.70 0.8 0.78  Sodium 135 - 145 mEq/L - 140 138  Potassium 3.5 - 5.1 mEq/L - 3.7 4.9  Chloride 96 - 112 mEq/L - 105 105  CO2 19 - 32 mEq/L - 29 25  Calcium 8.4 - 10.5 mg/dL - 9.0 8.3(L)  Total Protein 6.0 - 8.3 g/dL - - 6.6  Total Bilirubin 0.3 - 1.2 mg/dL - - 0.2(L)  Alkaline Phos 39 - 117 U/L - - 83  AST 0 - 37 U/L - - 33  ALT 0 - 35 U/L - - 27    PATHOLOGY REPORT: Diagnosis 1. Colon, polyp(s), descending - HYPERPLASTIC POLYP. NO ADENOMATOUS CHANGE OR MALIGNANCY 2. Colon, polyp(s), rectum mass - ADENOCARCINOMA. Microscopic Comment 2. The rectal mass is an invasive colorectal adenocarcinoma.   RADIOGRAPHIC STUDIES: I have personally reviewed the radiological images as listed and agreed with the findings in the report. Ct Chest W Contrast  08/06/2015   CLINICAL DATA:  Rectal mass on colonoscopy performed yesterday. Blood in stools. Initial encounter. EXAM: CT CHEST, ABDOMEN, AND PELVIS WITH CONTRAST TECHNIQUE: Multidetector CT imaging of the chest, abdomen and pelvis was performed following the standard protocol during bolus administration of intravenous contrast. CONTRAST:  166mL OMNIPAQUE IOHEXOL 300 MG/ML  SOLN COMPARISON:  None. FINDINGS: CT CHEST FINDINGS Mediastinum/Nodes: There are no enlarged mediastinal, hilar or axillary lymph nodes. The thyroid gland, trachea and esophagus demonstrate no significant findings. The heart size is normal. There is no pericardial effusion. There are no significant vascular findings. Lungs/Pleura: There is no pleural effusion.The lungs are clear. Musculoskeletal/Chest wall: No chest wall mass or suspicious osseous findings. CT ABDOMEN AND PELVIS FINDINGS Hepatobiliary: The liver demonstrates no suspicious findings. There is focal fat adjacent to the falciform ligament and a 9 mm cyst in the lateral segment of the left lobe. There is probable layering sludge within the gallbladder lumen. No evidence of calcified gallstones, gallbladder wall thickening or biliary dilatation. Pancreas: Unremarkable. No pancreatic ductal dilatation or surrounding inflammatory changes. Spleen: Normal in size without focal abnormality. Adrenals/Urinary Tract: Both adrenal glands appear normal. The kidneys appear normal without evidence of urinary tract calculus, suspicious lesion or hydronephrosis. No bladder abnormalities are seen. Stomach/Bowel: There is a 2 cm segment of circumferential rectal wall thickening anterior to the distal sacrum (best seen on coronal image 64) which could be the reported abnormality on colonoscopy as correlated with that report. There is asymmetric 3.2 x 2.6 cm soft tissue thickening involving the posterior right wall of the distal rectum at the anal verge (image 120). No other bowel wall thickening, distention or  surrounding inflammatory change identified. The appendix appears normal. Vascular/Lymphatic: There are no enlarged abdominal or pelvic lymph nodes. No significant vascular findings are present. Reproductive: Unremarkable. Other: No ascites or peritoneal nodularity. Musculoskeletal: No acute or significant osseous findings. 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 IMPRESSION: 1. The patient's rectal mass is not well visualized. There is a possible area of circumferential rectal wall thickening anterior to the distal sacrum which may correspond with the reported abnormality on colonoscopy. There is an additional area of asymmetric soft tissue thickening along the posterior right wall of the distal rectum. 2. No evidence of bowel obstruction or metastatic disease. 3. Negative chest CT. Electronically  Signed   By: Richardean Sale M.D.   On: 08/06/2015 08:31   Ct Abdomen Pelvis W Contrast  08/06/2015  CLINICAL DATA:  Rectal mass on colonoscopy performed yesterday. Blood in stools. Initial encounter. EXAM: CT CHEST, ABDOMEN, AND PELVIS WITH CONTRAST TECHNIQUE: Multidetector CT imaging of the chest, abdomen and pelvis was performed following the standard protocol during bolus administration of intravenous contrast. CONTRAST:  19mL OMNIPAQUE IOHEXOL 300 MG/ML  SOLN COMPARISON:  None. FINDINGS: CT CHEST FINDINGS Mediastinum/Nodes: There are no enlarged mediastinal, hilar or axillary lymph nodes. The thyroid gland, trachea and esophagus demonstrate no significant findings. The heart size is normal. There is no pericardial effusion. There are no significant vascular findings. Lungs/Pleura: There is no pleural effusion.The lungs are clear. Musculoskeletal/Chest wall: No chest wall mass or suspicious osseous findings. CT ABDOMEN AND PELVIS FINDINGS Hepatobiliary: The liver demonstrates no suspicious findings. There is focal fat adjacent to the falciform ligament and a 9 mm cyst in the lateral segment of the left lobe. There  is probable layering sludge within the gallbladder lumen. No evidence of calcified gallstones, gallbladder wall thickening or biliary dilatation. Pancreas: Unremarkable. No pancreatic ductal dilatation or surrounding inflammatory changes. Spleen: Normal in size without focal abnormality. Adrenals/Urinary Tract: Both adrenal glands appear normal. The kidneys appear normal without evidence of urinary tract calculus, suspicious lesion or hydronephrosis. No bladder abnormalities are seen. Stomach/Bowel: There is a 2 cm segment of circumferential rectal wall thickening anterior to the distal sacrum (best seen on coronal image 64) which could be the reported abnormality on colonoscopy as correlated with that report. There is asymmetric 3.2 x 2.6 cm soft tissue thickening involving the posterior right wall of the distal rectum at the anal verge (image 120). No other bowel wall thickening, distention or surrounding inflammatory change identified. The appendix appears normal. Vascular/Lymphatic: There are no enlarged abdominal or pelvic lymph nodes. No significant vascular findings are present. Reproductive: Unremarkable. Other: No ascites or peritoneal nodularity. Musculoskeletal: No acute or significant osseous findings. 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 IMPRESSION: 1. The patient's rectal mass is not well visualized. There is a possible area of circumferential rectal wall thickening anterior to the distal sacrum which may correspond with the reported abnormality on colonoscopy. There is an additional area of asymmetric soft tissue thickening along the posterior right wall of the distal rectum. 2. No evidence of bowel obstruction or metastatic disease. 3. Negative chest CT. Electronically Signed   By: Richardean Sale M.D.   On: 08/06/2015 08:31   Mm Screening Breast Tomo Bilateral  08/11/2015  CLINICAL DATA:  Screening. EXAM: DIGITAL SCREENING BILATERAL MAMMOGRAM WITH 3D TOMO WITH CAD COMPARISON:  Previous  exam(s). ACR Breast Density Category c: The breast tissue is heterogeneously dense, which may obscure small masses. FINDINGS: There are no findings suspicious for malignancy. Images were processed with CAD. IMPRESSION: No mammographic evidence of malignancy. A result letter of this screening mammogram will be mailed directly to the patient. RECOMMENDATION: Screening mammogram in one year. (Code:SM-B-01Y) BI-RADS CATEGORY  1: Negative. Electronically Signed   By: Lajean Manes M.D.   On: 08/11/2015 13:26   COLONOSCOPY 08/04/2015 Dr. Silverio Decamp  COLON FINDINGS:  5 cm rectal mass starting at 10 cm from anal verge extending to 15 cm, friable ulcerated mucosa concerning for malignancy, multiple random biopsies were obtained. 3 mm polyp in sigmoid colon removed by cold biopsy forceps. Diverticulosis. Retroflexed views revealed internal hemorrhoids. The time to cecum = 0.7 Withdrawal time = 14.0 The scope was  withdrawn and the procedure completed.  COMPLICATIONS: There were no immediate complications.  ENDOSCOPIC IMPRESSION: 5 cm rectal mass starting at 10 cm from anal verge extending to 15 cm, friable ulcerated mucosa concerning for malignancy, multiple random biopsies were obtained. 3 mm polyp in sigmoid colon removed by cold biopsy forceps. Diverticulosis RECOMMENDATIONS: 1. Await pathology results 2. CT chest, abdomen and pelvis with contrast Referral to surgery based on pathology and imaging results Repeat colonoscopy in 6-12 months eSigned: Harl Bowie, MD 08/04/15  ASSESSMENT & PLAN:  59 year old Caucasian female, history of superficial skin melanoma and basal cell carcinoma, otherwise healthy and fit, presented with rectal bleeding.   1. Rectal adenocarcinoma, [id number] -I reviewed her colonoscopy finding and the surgical biopsy results in detail with her and her husband. -She will need to have further staging workup. I discussed with radiation oncologist Dr. Lisbeth Renshaw and surgeon Dr. Marcello Moores,  who also saw her today, and we decided to have endoscopy ultrasound. We have contacted Dr. Ardis Hughs who will arrange for next week. -We reviewed the neck to history of rectal cancer, and his risk of recurrence after surgery. -The final treatment plan will depend on the final staging. If she has T1-2N0 tumor, she will likely proceed with upfront surgical resection. If she has T3-4 or positive lymph nodes on the ultrasound staging, we would recommend neoadjuvant concurrent chemoradiation, followed by surgical resection and adjuvant chemotherapy. -I discussed the chemotherapy regiment with concurrent radiation, typically Capecitabine or 5 FEU patient, and adjuvant chemotherapy with FOLFOX -I will see her back in 2-3 weeks (due to the holiday break), to finalize her treatment plan if she needs neoadjuvant chemoradiation. -The patient and her husband had multiple questions about the prognosis, treatment options and side effects. I answered to their satisfaction.  Plan -Endoscopy ultrasound next week by Dr. Ardis Hughs for staging  -lab work CBC, CMP and CEA -If she needs neoadjuvant chemoradiation, I'll arrange chemotherapy class, and send a prescription of Xeloda to [facility], and see her back in 2-3 weeks, before we start treatment on 1/3 or 1/9.    Orders Placed This Encounter  Procedures  . Ambulatory referral to Gastroenterology    Referral Priority:  Urgent    Referral Type:  Consultation    Referral Reason:  Specialty Services Required    Referred to Provider:  Milus Banister, MD    Number of Visits Requested:  1    All questions were answered. The patient knows to call the clinic with any problems, questions or concerns. I spent 55 minutes counseling the patient face to face. The total time spent in the appointment was 60 minutes and more than 50% was on counseling.     Truitt Merle, MD 08/13/2015 9:22 AM    Addendum The patient underwent EUS by Dr. Ardis Hughs today, which showed a  T3 N0 lesion.  I spoke with patient, and recommend her to have neoadjuvant concurrent chemoradiation with capecitabine. She had numerous questions about the treatment planning, potential side effects, success rate, and why her rectal polyps was not resected completely 2 years ago. I answered her questions to the best of my knowledge.   Dr. Ardis Hughs also ordered a vaginal ultrasound to evaluate the soft tissue mass behind the rectum.  I will send a prescription of Xeloda 1500 mg bid #90, with one refill to [facility] pharmacy tomorrow.  I'll see her back on 08/31/2015. She will have chemotherapy class next week.  Truitt Merle  08/19/2015

## 2015-08-14 LAB — CEA: CEA: 1.3 ng/mL (ref 0.0–5.0)

## 2015-08-16 ENCOUNTER — Encounter: Payer: Self-pay | Admitting: Radiation Oncology

## 2015-08-16 ENCOUNTER — Telehealth: Payer: Self-pay | Admitting: *Deleted

## 2015-08-16 ENCOUNTER — Other Ambulatory Visit: Payer: Self-pay

## 2015-08-16 DIAGNOSIS — C20 Malignant neoplasm of rectum: Secondary | ICD-10-CM

## 2015-08-16 NOTE — Telephone Encounter (Signed)
Retrieved VM at 1400 today that was left at 1340: requesting results of her CEA from Friday to be called to her and for the status of the EUS appointment with Dr. Ardis Hughs. Requests to be called after 4:30 pm today. Noted that [facility] called her at 1346 today and gave her the appointment for EUS. Will request collaborative nurse call her CEA results to her.

## 2015-08-16 NOTE — Telephone Encounter (Signed)
VM from husband, Elenore Rota that patient is "frazzeled". He requests RN call back to discuss this with him. Called back and he was on another line. Left message with Network engineer. Notified [facility] of her anxiety with waiting till 12/29, despite being told this will not impact her cancer outcome to wait another week. Office was able to arrange for her EUS to be done on 08/19/15 and she was notified.

## 2015-08-16 NOTE — Telephone Encounter (Signed)
Close encounter 

## 2015-08-16 NOTE — Telephone Encounter (Signed)
EUS scheduled, pt instructed and medications reviewed.  Patient instructions mailed to home.  Patient to call with any questions or concerns.  

## 2015-08-16 NOTE — Telephone Encounter (Signed)
Called Molly Vang and made her aware of the CEA result being normal. Reassured her I will be following her EUS results and let appropriate physicians know of T-size of tumor once Dr. Ardis Hughs releases this. She is very appreciative for Dr. Ardis Hughs for acommodating her this week. Had questions about if this is curable--informed her it most likely is. She is concerned since she is so healthy, why this has happened to her. Disappointed that none of the physicians said anything about how healthy she is. She has made her daughter aware of her new diagnosis today and it went well.

## 2015-08-17 ENCOUNTER — Encounter (HOSPITAL_COMMUNITY): Payer: Self-pay | Admitting: *Deleted

## 2015-08-19 ENCOUNTER — Telehealth: Payer: Self-pay

## 2015-08-19 ENCOUNTER — Telehealth: Payer: Self-pay | Admitting: *Deleted

## 2015-08-19 ENCOUNTER — Encounter (HOSPITAL_COMMUNITY)
Admission: RE | Disposition: A | Discharge status: Home / Self Care | Payer: Self-pay | Source: Ambulatory Visit | Attending: Gastroenterology

## 2015-08-19 ENCOUNTER — Ambulatory Visit (HOSPITAL_COMMUNITY): Payer: Federal, State, Local not specified - PPO | Admitting: Anesthesiology

## 2015-08-19 ENCOUNTER — Encounter (HOSPITAL_COMMUNITY): Payer: Self-pay | Admitting: *Deleted

## 2015-08-19 ENCOUNTER — Other Ambulatory Visit: Payer: Self-pay | Admitting: Hematology

## 2015-08-19 ENCOUNTER — Ambulatory Visit (HOSPITAL_COMMUNITY)
Admission: RE | Admit: 2015-08-19 | Discharge: 2015-08-19 | Disposition: A | Discharge status: Home / Self Care | Payer: Federal, State, Local not specified - PPO | Source: Ambulatory Visit | Attending: Gastroenterology | Admitting: Gastroenterology

## 2015-08-19 DIAGNOSIS — C44310 Basal cell carcinoma of skin of unspecified parts of face: Secondary | ICD-10-CM | POA: Diagnosis not present

## 2015-08-19 DIAGNOSIS — Z7982 Long term (current) use of aspirin: Secondary | ICD-10-CM | POA: Insufficient documentation

## 2015-08-19 DIAGNOSIS — Z79899 Other long term (current) drug therapy: Secondary | ICD-10-CM | POA: Insufficient documentation

## 2015-08-19 DIAGNOSIS — C20 Malignant neoplasm of rectum: Secondary | ICD-10-CM

## 2015-08-19 DIAGNOSIS — Z85820 Personal history of malignant melanoma of skin: Secondary | ICD-10-CM | POA: Insufficient documentation

## 2015-08-19 DIAGNOSIS — K6289 Other specified diseases of anus and rectum: Secondary | ICD-10-CM

## 2015-08-19 HISTORY — PX: EUS: SHX5427

## 2015-08-19 SURGERY — ULTRASOUND, LOWER GI TRACT, ENDOSCOPIC
Anesthesia: Monitor Anesthesia Care

## 2015-08-19 MED ORDER — PROPOFOL 10 MG/ML IV BOLUS
INTRAVENOUS | Status: AC
  Filled 2015-08-19: qty 40

## 2015-08-19 MED ORDER — LIDOCAINE HCL (CARDIAC) 20 MG/ML IV SOLN
INTRAVENOUS | Status: AC
  Filled 2015-08-19: qty 5

## 2015-08-19 MED ORDER — SPOT INK MARKER SYRINGE KIT
PACK | SUBMUCOSAL | Status: DC | PRN
  Administered 2015-08-19: 2 mL via SUBMUCOSAL

## 2015-08-19 MED ORDER — MIDAZOLAM HCL 10 MG/2ML IJ SOLN
INTRAMUSCULAR | Status: DC | PRN
  Administered 2015-08-19: 1 mg via INTRAVENOUS
  Administered 2015-08-19: 2 mg via INTRAVENOUS
  Administered 2015-08-19: 1 mg via INTRAVENOUS
  Administered 2015-08-19 (×2): 2 mg via INTRAVENOUS

## 2015-08-19 MED ORDER — CAPECITABINE 500 MG PO TABS: 825.0000 mg/m2 | ORAL_TABLET | Freq: Two times a day (BID) | ORAL | Status: DC

## 2015-08-19 MED ORDER — FENTANYL CITRATE (PF) 100 MCG/2ML IJ SOLN
INTRAMUSCULAR | Status: AC
  Filled 2015-08-19: qty 2

## 2015-08-19 MED ORDER — SODIUM CHLORIDE 0.9 % IV SOLN: INTRAVENOUS | Status: DC

## 2015-08-19 MED ORDER — SPOT INK MARKER SYRINGE KIT
PACK | SUBMUCOSAL | Status: AC
  Filled 2015-08-19: qty 5

## 2015-08-19 MED ORDER — LACTATED RINGERS IV SOLN
INTRAVENOUS | Status: DC
  Administered 2015-08-19: 1000 mL via INTRAVENOUS

## 2015-08-19 MED ORDER — FENTANYL CITRATE (PF) 100 MCG/2ML IJ SOLN
INTRAMUSCULAR | Status: DC | PRN
  Administered 2015-08-19: 25 ug via INTRAVENOUS
  Administered 2015-08-19 (×2): 12.5 ug via INTRAVENOUS
  Administered 2015-08-19: 25 ug via INTRAVENOUS

## 2015-08-19 MED ORDER — MIDAZOLAM HCL 5 MG/ML IJ SOLN
INTRAMUSCULAR | Status: AC
  Filled 2015-08-19: qty 2

## 2015-08-19 NOTE — H&P (View-Only) (Signed)
Molly Vang  [phone] Fax:([phone]) [phone]  Clinic New Consult Note   Patient Care Team: Arvella Nigh, MD as PCP - General (Obstetrics and Gynecology) 08/13/2015  Referral physician: Dr. Silverio Decamp   CHIEF COMPLAINTS/PURPOSE OF CONSULTATION:  Newly diagnosed rectal cancer     Rectal adenocarcinoma ([place])   08/04/2015 Initial Biopsy rectal mass adenocarcinoma, descending colon polyp hyperplastic polyp.    08/04/2015 Procedure 5 cm rectal mass starting at 10 cm from anal verge extending to 15 cm, 16mm sigmoid polyp was removed    08/06/2015 Imaging CT chest, abdomen and pelvis w contrast showed a possible area of rectal wall thickening aterior to the distal sacrum, no adenopathy or distant metastasis    08/13/2015 Initial Diagnosis Rectal adenocarcinoma ([facility])    HISTORY OF PRESENTING ILLNESS:  Molly Vang 59 y.o. female is here because of newly diagnosed rectal adenocarcinoma. She is accompanied by her husband to our multidisciplinary GI clinic today.  She had a colonoscopy in 05/2013 by Dr. Olevia Perches which showed a 3-61mm sessile rectal polyp, which was removed, pathology revealed tubular adenoma. She has had intermittent rectal bleeding since Oct 2016, mild, she had mild constipation, no pain, nause or change of apeptie, no weihgt loss. She called Dr. Nichola Sizer office, who has recently retired, and was seen by Dr. Silverio Decamp. She underwent colonoscopy on 08/04/2015, which showed a 5 cm rectal mass starting at a 10 cm from an approach extending to 15 cm, and a 3 mm sigmoid polyp which was removed. The rectal mass biopsy showed adenocarcinoma. CT of the chest abdomen and pelvis with contrast was performed on 08/06/2015, which showed no adenopathy or distant metastasis.  She otherwise feels well, no other complains. She is a homemaker, also works as a English as a second language teacher. She eats healthy diet, and walks 5 miles a day, very physically active.   She had a early stage skin  melanoma on her right arm, was removed a few years ago. She also has a basal cell skin cancer on her face, will need a resection soon. She otherwise is very healthy. No family history of colon cancer or other malignancy.   MEDICAL HISTORY:  Past Medical History  Diagnosis Date  . SVT (supraventricular tachycardia) ([place]) 06/11/2013    svt on 10/6// ablation on 10/15  . Cancer ([place])     melanoma right bicep removed    SURGICAL HISTORY: Past Surgical History  Procedure Laterality Date  . Tonsillectomy  1960  . US echocardiography  11/14/11    trace TR,MR  . Supraventricular tachycardia ablation  06/11/2013  . Ovarian cyst surgery  1993  . Supraventricular tachycardia ablation N/A 06/11/2013    Procedure: SUPRAVENTRICULAR TACHYCARDIA ABLATION;  Surgeon: Evans Lance, MD;  Location: [facility] CATH LAB;  Service: Cardiovascular;  Laterality: N/A;  . Nm myocar perf wall motion  12/14/2010    normal  . Colonoscopy      SOCIAL HISTORY: Social History   Social History  . Marital Status: Married    Spouse Name: N/A  . Number of Children: 2   . Years of Education: N/A   Occupational History  . She is a Materials engineer    Social History Main Topics  . Smoking status: Never Smoker   . Smokeless tobacco: Never Used  . Alcohol Use: Yes     Comment: 06/11/2013 "beer or glass of wine maybe once/month"  . Drug Use: No  . Sexual Activity: Yes   Other Topics Concern  . Not on file  Social History Narrative    FAMILY HISTORY: Family History  Problem Relation Age of Onset  . Hypertension Father   . Hyperlipidemia Father   . Hypertension Mother   . Hyperlipidemia Brother   . Colon cancer Neg Hx   . Stomach cancer Neg Hx   . Esophageal cancer Neg Hx   . Rectal cancer Neg Hx     ALLERGIES:  has No Known Allergies.  MEDICATIONS:  Current Outpatient Prescriptions  Medication Sig Dispense Refill  . aspirin EC 81 MG tablet Take 81 mg by mouth daily.    . Calcium Carbonate-Vitamin D  (CALCIUM 600 + D PO) Take 1 tablet by mouth daily. Vit. D3 is 400iu    . chlorhexidine (PERIDEX) 0.12 % solution RINSE TWICE DAILY FOR 30 SECONDS AND THEN SPIT OUT AS DIRECTED  98  . chlorpheniramine (CHLOR-TRIMETON) 4 MG tablet Take 4 mg by mouth 2 (two) times daily as needed for allergies.    . fish oil-omega-3 fatty acids 1000 MG capsule Take 1 g by mouth daily. EPA 570mg   And DHA 130mg     . hydrocortisone (ANUSOL-HC) 25 MG suppository Place 1 suppository (25 mg total) rectally 2 (two) times daily. (Patient taking differently: Place 25 mg rectally 2 (two) times daily as needed. ) 28 suppository 1  . Multiple Vitamin (MULTIVITAMIN) tablet Take 1 tablet by mouth daily.    . naproxen sodium (ANAPROX) 220 MG tablet Take 220 mg by mouth daily as needed (for pain).    . Zinc Sulfate (ZINC 15 PO) Take by mouth as needed.    . zolpidem (AMBIEN) 10 MG tablet 5 mg at bedtime as needed. Takes 1/2 10mg  tablet     No current facility-administered medications for this visit.    REVIEW OF SYSTEMS:   Constitutional: Denies fevers, chills or abnormal night sweats Eyes: Denies blurriness of vision, double vision or watery eyes Ears, nose, mouth, throat, and face: Denies mucositis or sore throat Respiratory: Denies cough, dyspnea or wheezes Cardiovascular: Denies palpitation, chest discomfort or lower extremity swelling Gastrointestinal:  Denies nausea, heartburn or change in bowel habits Skin: Denies abnormal skin rashes Lymphatics: Denies new lymphadenopathy or easy bruising Neurological:Denies numbness, tingling or new weaknesses Behavioral/Psych: Mood is stable, no new changes  All other systems were reviewed with the patient and are negative.  PHYSICAL EXAMINATION: ECOG PERFORMANCE STATUS: 0 - Asymptomatic  Filed Vitals:   08/13/15 0859  BP: 132/80  Pulse: 70  Temp: 98 F (36.7 C)  Resp: 17   Filed Weights   08/13/15 0859  Weight: 133 lb 12.8 oz (60.691 kg)    GENERAL:alert, no  distress and comfortable SKIN: skin color, texture, turgor are normal, no rashes or significant lesions EYES: normal, conjunctiva are pink and non-injected, sclera clear OROPHARYNX:no exudate, no erythema and lips, buccal mucosa, and tongue normal  NECK: supple, thyroid normal size, non-tender, without nodularity LYMPH:  no palpable lymphadenopathy in the cervical, axillary or inguinal LUNGS: clear to auscultation and percussion with normal breathing effort HEART: regular rate & rhythm and no murmurs and no lower extremity edema ABDOMEN:abdomen soft, non-tender and normal bowel sounds Musculoskeletal:no cyanosis of digits and no clubbing  PSYCH: alert & oriented x 3 with fluent speech NEURO: no focal motor/sensory deficits  LABORATORY DATA:  I have reviewed the data as listed CBC Latest Ref Rng 07/20/2015 06/03/2013 05/19/2013  WBC 4.5 - 10.5 K/uL - 9.2 8.8  Hemoglobin 12.0 - 15.0 g/dL 12.6 12.8 12.9  Hematocrit 36.0 - 46.0 % -  37.9 37.5  Platelets 150.0 - 400.0 K/uL - 300.0 296    CMP Latest Ref Rng 08/04/2015 06/03/2013 05/19/2013  Glucose 70 - 99 mg/dL - 87 104(H)  BUN 6 - 23 mg/dL 9 12 14   Creatinine 0.40 - 1.20 mg/dL 0.70 0.8 0.78  Sodium 135 - 145 mEq/L - 140 138  Potassium 3.5 - 5.1 mEq/L - 3.7 4.9  Chloride 96 - 112 mEq/L - 105 105  CO2 19 - 32 mEq/L - 29 25  Calcium 8.4 - 10.5 mg/dL - 9.0 8.3(L)  Total Protein 6.0 - 8.3 g/dL - - 6.6  Total Bilirubin 0.3 - 1.2 mg/dL - - 0.2(L)  Alkaline Phos 39 - 117 U/L - - 83  AST 0 - 37 U/L - - 33  ALT 0 - 35 U/L - - 27    PATHOLOGY REPORT: Diagnosis 1. Colon, polyp(s), descending - HYPERPLASTIC POLYP. NO ADENOMATOUS CHANGE OR MALIGNANCY 2. Colon, polyp(s), rectum mass - ADENOCARCINOMA. Microscopic Comment 2. The rectal mass is an invasive colorectal adenocarcinoma.   RADIOGRAPHIC STUDIES: I have personally reviewed the radiological images as listed and agreed with the findings in the report. Ct Chest W Contrast  08/06/2015   CLINICAL DATA:  Rectal mass on colonoscopy performed yesterday. Blood in stools. Initial encounter. EXAM: CT CHEST, ABDOMEN, AND PELVIS WITH CONTRAST TECHNIQUE: Multidetector CT imaging of the chest, abdomen and pelvis was performed following the standard protocol during bolus administration of intravenous contrast. CONTRAST:  197mL OMNIPAQUE IOHEXOL 300 MG/ML  SOLN COMPARISON:  None. FINDINGS: CT CHEST FINDINGS Mediastinum/Nodes: There are no enlarged mediastinal, hilar or axillary lymph nodes. The thyroid gland, trachea and esophagus demonstrate no significant findings. The heart size is normal. There is no pericardial effusion. There are no significant vascular findings. Lungs/Pleura: There is no pleural effusion.The lungs are clear. Musculoskeletal/Chest wall: No chest wall mass or suspicious osseous findings. CT ABDOMEN AND PELVIS FINDINGS Hepatobiliary: The liver demonstrates no suspicious findings. There is focal fat adjacent to the falciform ligament and a 9 mm cyst in the lateral segment of the left lobe. There is probable layering sludge within the gallbladder lumen. No evidence of calcified gallstones, gallbladder wall thickening or biliary dilatation. Pancreas: Unremarkable. No pancreatic ductal dilatation or surrounding inflammatory changes. Spleen: Normal in size without focal abnormality. Adrenals/Urinary Tract: Both adrenal glands appear normal. The kidneys appear normal without evidence of urinary tract calculus, suspicious lesion or hydronephrosis. No bladder abnormalities are seen. Stomach/Bowel: There is a 2 cm segment of circumferential rectal wall thickening anterior to the distal sacrum (best seen on coronal image 64) which could be the reported abnormality on colonoscopy as correlated with that report. There is asymmetric 3.2 x 2.6 cm soft tissue thickening involving the posterior right wall of the distal rectum at the anal verge (image 120). No other bowel wall thickening, distention or  surrounding inflammatory change identified. The appendix appears normal. Vascular/Lymphatic: There are no enlarged abdominal or pelvic lymph nodes. No significant vascular findings are present. Reproductive: Unremarkable. Other: No ascites or peritoneal nodularity. Musculoskeletal: No acute or significant osseous findings. 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 IMPRESSION: 1. The patient's rectal mass is not well visualized. There is a possible area of circumferential rectal wall thickening anterior to the distal sacrum which may correspond with the reported abnormality on colonoscopy. There is an additional area of asymmetric soft tissue thickening along the posterior right wall of the distal rectum. 2. No evidence of bowel obstruction or metastatic disease. 3. Negative chest CT. Electronically  Signed   By: Richardean Sale M.D.   On: 08/06/2015 08:31   Ct Abdomen Pelvis W Contrast  08/06/2015  CLINICAL DATA:  Rectal mass on colonoscopy performed yesterday. Blood in stools. Initial encounter. EXAM: CT CHEST, ABDOMEN, AND PELVIS WITH CONTRAST TECHNIQUE: Multidetector CT imaging of the chest, abdomen and pelvis was performed following the standard protocol during bolus administration of intravenous contrast. CONTRAST:  138mL OMNIPAQUE IOHEXOL 300 MG/ML  SOLN COMPARISON:  None. FINDINGS: CT CHEST FINDINGS Mediastinum/Nodes: There are no enlarged mediastinal, hilar or axillary lymph nodes. The thyroid gland, trachea and esophagus demonstrate no significant findings. The heart size is normal. There is no pericardial effusion. There are no significant vascular findings. Lungs/Pleura: There is no pleural effusion.The lungs are clear. Musculoskeletal/Chest wall: No chest wall mass or suspicious osseous findings. CT ABDOMEN AND PELVIS FINDINGS Hepatobiliary: The liver demonstrates no suspicious findings. There is focal fat adjacent to the falciform ligament and a 9 mm cyst in the lateral segment of the left lobe. There  is probable layering sludge within the gallbladder lumen. No evidence of calcified gallstones, gallbladder wall thickening or biliary dilatation. Pancreas: Unremarkable. No pancreatic ductal dilatation or surrounding inflammatory changes. Spleen: Normal in size without focal abnormality. Adrenals/Urinary Tract: Both adrenal glands appear normal. The kidneys appear normal without evidence of urinary tract calculus, suspicious lesion or hydronephrosis. No bladder abnormalities are seen. Stomach/Bowel: There is a 2 cm segment of circumferential rectal wall thickening anterior to the distal sacrum (best seen on coronal image 64) which could be the reported abnormality on colonoscopy as correlated with that report. There is asymmetric 3.2 x 2.6 cm soft tissue thickening involving the posterior right wall of the distal rectum at the anal verge (image 120). No other bowel wall thickening, distention or surrounding inflammatory change identified. The appendix appears normal. Vascular/Lymphatic: There are no enlarged abdominal or pelvic lymph nodes. No significant vascular findings are present. Reproductive: Unremarkable. Other: No ascites or peritoneal nodularity. Musculoskeletal: No acute or significant osseous findings. 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 IMPRESSION: 1. The patient's rectal mass is not well visualized. There is a possible area of circumferential rectal wall thickening anterior to the distal sacrum which may correspond with the reported abnormality on colonoscopy. There is an additional area of asymmetric soft tissue thickening along the posterior right wall of the distal rectum. 2. No evidence of bowel obstruction or metastatic disease. 3. Negative chest CT. Electronically Signed   By: Richardean Sale M.D.   On: 08/06/2015 08:31   Mm Screening Breast Tomo Bilateral  08/11/2015  CLINICAL DATA:  Screening. EXAM: DIGITAL SCREENING BILATERAL MAMMOGRAM WITH 3D TOMO WITH CAD COMPARISON:  Previous  exam(s). ACR Breast Density Category c: The breast tissue is heterogeneously dense, which may obscure small masses. FINDINGS: There are no findings suspicious for malignancy. Images were processed with CAD. IMPRESSION: No mammographic evidence of malignancy. A result letter of this screening mammogram will be mailed directly to the patient. RECOMMENDATION: Screening mammogram in one year. (Code:SM-B-01Y) BI-RADS CATEGORY  1: Negative. Electronically Signed   By: Lajean Manes M.D.   On: 08/11/2015 13:26   COLONOSCOPY 08/04/2015 Dr. Silverio Decamp  COLON FINDINGS:  5 cm rectal mass starting at 10 cm from anal verge extending to 15 cm, friable ulcerated mucosa concerning for malignancy, multiple random biopsies were obtained. 3 mm polyp in sigmoid colon removed by cold biopsy forceps. Diverticulosis. Retroflexed views revealed internal hemorrhoids. The time to cecum = 0.7 Withdrawal time = 14.0 The scope was  withdrawn and the procedure completed.  COMPLICATIONS: There were no immediate complications.  ENDOSCOPIC IMPRESSION: 5 cm rectal mass starting at 10 cm from anal verge extending to 15 cm, friable ulcerated mucosa concerning for malignancy, multiple random biopsies were obtained. 3 mm polyp in sigmoid colon removed by cold biopsy forceps. Diverticulosis RECOMMENDATIONS: 1. Await pathology results 2. CT chest, abdomen and pelvis with contrast Referral to surgery based on pathology and imaging results Repeat colonoscopy in 6-12 months eSigned: Harl Bowie, MD 08/04/15  ASSESSMENT & PLAN:  59 year old Caucasian female, history of superficial skin melanoma and basal cell carcinoma, otherwise healthy and fit, presented with rectal bleeding.   1. Rectal adenocarcinoma, [id number] -I reviewed her colonoscopy finding and the surgical biopsy results in detail with her and her husband. -She will need to have further staging workup. I discussed with radiation oncologist Dr. Lisbeth Renshaw and surgeon Dr. Marcello Moores,  who also saw her today, and we decided to have endoscopy ultrasound. We have contacted Dr. Ardis Hughs who will arrange for next week. -We reviewed the neck to history of rectal cancer, and his risk of recurrence after surgery. -The final treatment plan will depend on the final staging. If she has T1-2N0 tumor, she will likely proceed with upfront surgical resection. If she has T3-4 or positive lymph nodes on the ultrasound staging, we would recommend neoadjuvant concurrent chemoradiation, followed by surgical resection and adjuvant chemotherapy. -I discussed the chemotherapy regiment with concurrent radiation, typically Capecitabine or 5 FEU patient, and adjuvant chemotherapy with FOLFOX -I will see her back in 2-3 weeks (due to the holiday break), to finalize her treatment plan if she needs neoadjuvant chemoradiation. -The patient and her husband had multiple questions about the prognosis, treatment options and side effects. I answered to their satisfaction.  Plan -Endoscopy ultrasound next week by Dr. Ardis Hughs for staging  -lab work CBC, CMP and CEA -If she needs neoadjuvant chemoradiation, I'll arrange chemotherapy class, and send a prescription of Xeloda to [facility], and see her back in 2-3 weeks, before we start treatment on 1/3 or 1/9.    Orders Placed This Encounter  Procedures  . Ambulatory referral to Gastroenterology    Referral Priority:  Urgent    Referral Type:  Consultation    Referral Reason:  Specialty Services Required    Referred to Provider:  Milus Banister, MD    Number of Visits Requested:  1    All questions were answered. The patient knows to call the clinic with any problems, questions or concerns. I spent 55 minutes counseling the patient face to face. The total time spent in the appointment was 60 minutes and more than 50% was on counseling.     Truitt Merle, MD 08/13/2015 9:22 AM

## 2015-08-19 NOTE — Anesthesia Preprocedure Evaluation (Addendum)
Anesthesia Evaluation  Patient identified by MRN, date of birth, ID band Patient awake    Reviewed: Allergy & Precautions, NPO status , Patient's Chart, lab work & pertinent test results  Airway Mallampati: I  TM Distance: >3 FB Neck ROM: Full    Dental  (+) Teeth Intact, Dental Advisory Given   Pulmonary neg pulmonary ROS,    Pulmonary exam normal breath sounds clear to auscultation       Cardiovascular Normal cardiovascular exam Rhythm:Regular Rate:Normal  SVT s/p ablation 2014   Neuro/Psych negative neurological ROS     GI/Hepatic Neg liver ROS, Rectal cancer   Endo/Other  negative endocrine ROS  Renal/GU negative Renal ROS     Musculoskeletal negative musculoskeletal ROS (+)   Abdominal   Peds  Hematology negative hematology ROS (+)   Anesthesia Other Findings Day of surgery medications reviewed with the patient.  Reproductive/Obstetrics                             Anesthesia Physical Anesthesia Plan  ASA: III  Anesthesia Plan: MAC   Post-op Pain Management:    Induction: Intravenous  Airway Management Planned: Nasal Cannula  Additional Equipment:   Intra-op Plan:   Post-operative Plan:   Informed Consent: I have reviewed the patients History and Physical, chart, labs and discussed the procedure including the risks, benefits and alternatives for the proposed anesthesia with the patient or authorized representative who has indicated his/her understanding and acceptance.   Dental advisory given  Plan Discussed with: CRNA and Anesthesiologist  Anesthesia Plan Comments: (Anesthesia services were not required for this case.  Case proceeded under moderate sedation.)       Anesthesia Quick Evaluation

## 2015-08-19 NOTE — Telephone Encounter (Signed)
-----   Message from Milus Banister, MD sent at 08/19/2015 10:07 AM EST ----- All, Just completed EUS.  Any ideas about the second lesion described below?  I'm ordering a transvaginal US to start workup.  Was not amenable to EUS FNA.  Adrienne Mocha, She needs pelvic ultrasound ordered, transvaginal. Thanks    ENDOSCOPIC IMPRESSION: 1. Non circumferential, 3cm long uT3N0 (Stage IIa) rectal adenocarcinoma with distal edge located 8cm from the anal verge. The lateral edges of the tumor were labeled with submucosal injection of SPOT following EUS examination. 2. The "asymmetric 3.2 x 2.6 cm soft tissue thickening involving the posterior right wall of the distal rectum at the anal verge (image 120)" noted by CT scan was located.  It is distinct from the malignant mass described above, was vaguely bordered and I suspect this is gynecologic in orgin.   RECOMMENDATIONS: She will likely benefit from neoadjuvant chemo/XRT.  Prefer more information about the soft tissue described in #2 above and will order pelvic ultrasound.  Will communicate these results with Dr. Lisbeth Renshaw, [place], [place].

## 2015-08-19 NOTE — Op Note (Addendum)
[facility] [place], [address]   ENDOSCOPIC ULTRASOUND PROCEDURE REPORT  PATIENT: Molly Vang, Molly Vang  MR#: [id number] BIRTHDATE: 02-14-1956  GENDER: female ENDOSCOPIST: Milus Banister, MD PROCEDURE DATE:  08/19/2015 PROCEDURE:   Lower EUS, sigmoidoscopy with submucosal injection ASA CLASS:      Class II INDICATIONS:   1.  newly diagnosed rectal adenocarcinoma; no clear metastatic disease on CT but "There is asymmetric 3.2 x 2.6 cm soft tissue thickening involving the posterior right wall of the distal rectum at the anal verge (image 120)". MEDICATIONS: Fentanyl 75 mcg IV and Versed 8 mg IV DESCRIPTION OF PROCEDURE:   After the risks benefits and alternatives of the procedure were  explained, informed consent was obtained. The patient was then placed in the left, lateral, decubitus postion and IV sedation was administered. Throughout the procedure, the patients blood pressure, pulse and oxygen saturations were monitored continuously.  Under direct visualization, the Pentax Radial EUS [id number]  endoscope was introduced through the mouth  and advanced to the sigmoid colon . Water was used as necessary to provide an acoustic interface.  Upon completion of the imaging, water was removed and the patient was sent to the recovery room in satisfactory condition.  Sigmoidoscopic findings: with radial echoendoscope and standard adult colonoscope: 1. Non-circumferntial, clearly malignant, friable mass that is 2.5cm across, 3cm in length, with distal edge located 8cm from the anal verge. Following EUS examination the lateral edges of the mass were labeled with submucosal injection of SPOT. 2. Otherwise normal sigmoidoscopic exam with good retroflex views of the anus  EUS findings: 1. The mass described above correlated with a 3cm wide, 26mm depth, 3cm long hypoechoic mass that clear passes into and through the muscularis propria layer of the rectal wall  (uT3). 2. There was no perirectal adenopathy. 3. The "soft tissue" noted on CT scan was visible on this exam: this was vaguely bordered, about 3cm across, located along posterior right sided wall outside the rectum.  This is suspicious for a gynecologic structure and was not sampled during this exam.  ENDOSCOPIC IMPRESSION: 1. Non circumferential, 3cm long uT3N0 (Stage IIa) rectal adenocarcinoma with distal edge located 8cm from the anal verge. The lateral edges of the tumor were labeled with submucosal injection of SPOT following EUS examination. 2. The "asymmetric 3.2 x 2.6 cm soft tissue thickening involving the posterior right wall of the distal rectum at the anal verge (image 120) noted by CT scan was located.  It is distinct from the malignant mass described above, was vaguely bordered and I suspect this is gynecologic in orgin.  RECOMMENDATIONS: She will likely benefit from neoadjuvant chemo/XRT.  Prefer more information about the soft tissue described in #2 above and will order pelvic ultrasound.  Will communicate these results with Dr. Lisbeth Renshaw, Derrick Ravel, [place].  _______________________________ eSigned:  Milus Banister, MD 08/19/2015 10:06 AM Revised: 08/19/2015 10:06 AM  PATIENT NAME:  Molly, Vang MR#: [id number]

## 2015-08-19 NOTE — Interval H&P Note (Signed)
History and Physical Interval Note:  08/19/2015 8:52 AM  Molly Vang  has presented today for surgery, with the diagnosis of rectal cancer  The various methods of treatment have been discussed with the patient and family. After consideration of risks, benefits and other options for treatment, the patient has consented to  Procedure(s): LOWER ENDOSCOPIC ULTRASOUND (EUS) (N/A) as a surgical intervention .  The patient's history has been reviewed, patient examined, no change in status, stable for surgery.  I have reviewed the patient's chart and labs.  Questions were answered to the patient's satisfaction.     Milus Banister

## 2015-08-19 NOTE — Telephone Encounter (Signed)
Oncology Nurse Navigator Documentation  Oncology Nurse Navigator Flowsheets 08/19/2015  Referral date to RadOnc/MedOnc -  Navigator Encounter Type Telephone  Patient Visit Type -  Treatment Phase -  Barriers/Navigation Needs -  Education -  Interventions Coordination of Care  Coordination of Care Other-made Dr. Burr Medico, Marcello Moores and [facility] aware of EUS report-will need neoadjuvant. Ordered chemo class and requested SIM appointment.  Time Spent with Patient -  Will follow her [place] results on 08/26/15. Molly Vang was not home. Left message that all MDs are aware of result and Will f/u on her [place].

## 2015-08-19 NOTE — Telephone Encounter (Signed)
You have been scheduled for an pelvic ultrasound at [facility] (1st floor of hospital) on 08/26/15 at 11 am. Please arrive 15 minutes prior to your appointment for registration. You will need to have a full bladder, drink 32 ounces of water 1 hour prior to the exam.  Should you need to reschedule your appointment, please contact radiology at [phone]. This test typically takes about 30 minutes to perform.  Information was given to Molly Vang at [place] to pass along to the pt.

## 2015-08-20 ENCOUNTER — Other Ambulatory Visit: Payer: Self-pay | Admitting: *Deleted

## 2015-08-20 ENCOUNTER — Encounter (HOSPITAL_COMMUNITY): Payer: Self-pay | Admitting: Gastroenterology

## 2015-08-20 ENCOUNTER — Encounter: Payer: Self-pay | Admitting: Hematology

## 2015-08-20 ENCOUNTER — Telehealth: Payer: Self-pay | Admitting: Hematology

## 2015-08-20 ENCOUNTER — Ambulatory Visit
Admission: RE | Admit: 2015-08-20 | Discharge: 2015-08-20 | Disposition: A | Discharge status: Home / Self Care | Payer: Federal, State, Local not specified - PPO | Source: Ambulatory Visit | Attending: Radiation Oncology | Admitting: Radiation Oncology

## 2015-08-20 DIAGNOSIS — C20 Malignant neoplasm of rectum: Secondary | ICD-10-CM | POA: Diagnosis present

## 2015-08-20 DIAGNOSIS — Z510 Encounter for antineoplastic radiation therapy: Secondary | ICD-10-CM | POA: Diagnosis present

## 2015-08-20 MED ORDER — CAPECITABINE 500 MG PO TABS: 825.0000 mg/m2 | ORAL_TABLET | Freq: Two times a day (BID) | ORAL | Status: DC

## 2015-08-20 NOTE — Telephone Encounter (Signed)
per pof to sch pt appt-cld & left pt a message and gave pt appt time & date

## 2015-08-23 NOTE — Progress Notes (Signed)
  Radiation Oncology         [phone] ________________________________  Name: Molly Vang MRN: [id number]  Date: 08/20/2015  DOB: 1956/01/25  Optical Surface Tracking Plan:  Since intensity modulated radiotherapy (IMRT) and 3D conformal radiation treatment methods are predicated on accurate and precise positioning for treatment, intrafraction motion monitoring is medically necessary to ensure accurate and safe treatment delivery.  The ability to quantify intrafraction motion without excessive ionizing radiation dose can only be performed with optical surface tracking. Accordingly, surface imaging offers the opportunity to obtain 3D measurements of patient position throughout IMRT and 3D treatments without excessive radiation exposure.  I am ordering optical surface tracking for this patient's upcoming course of radiotherapy. ________________________________  Kyung Rudd, MD 08/23/2015 2:39 PM    Reference:   Ursula Alert, J, et al. Surface imaging-based analysis of intrafraction motion for breast radiotherapy patients.Journal of [place], n. 6, nov. 2014. ISSN [id number].   Available at: <http://www.jacmp.org/index.php/jacmp/article/view/4957>.

## 2015-08-23 NOTE — Progress Notes (Signed)
  Radiation Oncology         [phone] ________________________________  Name: Molly Vang MRN: [id number]  Date: 08/20/2015  DOB: Sep 28, 1955   SIMULATION AND TREATMENT PLANNING NOTE  DIAGNOSIS:     ICD-9-CM ICD-10-CM   1. Rectal adenocarcinoma (HCC) 154.1 C20      The patient presented for simulation for the patient's upcoming course of radiation for the diagnosis of rectal cancer. The patient was placed in a supine position. A customized vac-lock bag was constructed to aid in patient immobilization on. This complex treatment device will be used on a daily basis during the treatment. In this fashion a CT scan was obtained through the pelvic region and the isocenter was placed near midline within the pelvis. Surface markings were placed.  The patient's imaging was loaded into the radiation treatment planning system. The patient will initially be planned to receive a course of radiation to a dose of 45 Gy. This will be accomplished in 25 fractions at 1.8 gray per fraction. This initial treatment will correspond to a 3-D conformal technique. The target has been contoured in addition to the rectum, bladder and femoral heads. Dose volume histograms of each of these structures have been requested and these will be carefully reviewed as part of the 3-D conformal treatment planning process. To accomplish this initial treatment, 4 customized blocks have been designed for this purpose. Each of these 4 complex treatment devices will be used on a daily basis during the initial course of the treatment. It is anticipated that the patient will then receive a boost for an additional 5.4 Gy. The anticipated total dose therefore will be 50.4 Gy.    Special treatment procedure The patient will receive chemotherapy during the course of radiation treatment. The patient may experience increased or overlapping toxicity due to this combined-modality approach and the patient will be monitored for such problems.  This may include extra lab work as necessary. This therefore constitutes a special treatment procedure.    ________________________________  Jodelle Gross, MD, PhD

## 2015-08-24 ENCOUNTER — Telehealth: Payer: Self-pay | Admitting: *Deleted

## 2015-08-24 NOTE — Telephone Encounter (Signed)
Oncology Nurse Navigator Documentation  Oncology Nurse Navigator Flowsheets 08/24/2015  Referral date to RadOnc/MedOnc -  Navigator Encounter Type Telephone  Patient Visit Type -  Treatment Phase Treatment planning  Barriers/Navigation Needs Family concerns  Education Preparing for Upcoming Ultrasound/Treatment;Other--schedule  Interventions Coordination of Care  Coordination of Care Chemo class moved to 08/31/15 at 0930 as requested  Time Spent with Patient 15  Needs to cancel her chemo class for this week-her mother has recently fallen and in hospital and not expected to survive (61 yo w/dementia). She needs to go to hospital. Plans to return to have her Pelvic US on 12/29 as scheduled and requesting to know the radiologists name that will read her [place] because she wants to know her results immediately. Made her aware I will note on her appointment she is requesting to speak with radiologist that day.

## 2015-08-25 ENCOUNTER — Other Ambulatory Visit: Payer: Federal, State, Local not specified - PPO

## 2015-08-25 ENCOUNTER — Telehealth: Payer: Self-pay | Admitting: *Deleted

## 2015-08-25 NOTE — Telephone Encounter (Signed)
  Oncology Nurse Navigator Documentation    Navigator Encounter Type: Telephone (08/25/15 1715): Molly Vang and was told that her co pay for Xeloda is $787.00. It is ready for pick up. Forwarded this information to oral chemo pharmacist, Montel Clock to follow up on this with patient.

## 2015-08-26 ENCOUNTER — Telehealth: Payer: Self-pay | Admitting: *Deleted

## 2015-08-26 ENCOUNTER — Ambulatory Visit (HOSPITAL_COMMUNITY)
Admit: 2015-08-26 | Discharge: 2015-08-26 | Disposition: A | Discharge status: Home / Self Care | Payer: Federal, State, Local not specified - PPO | Attending: Gastroenterology | Admitting: Gastroenterology

## 2015-08-26 ENCOUNTER — Ambulatory Visit (HOSPITAL_COMMUNITY): Payer: Federal, State, Local not specified - PPO

## 2015-08-26 DIAGNOSIS — K6289 Other specified diseases of anus and rectum: Secondary | ICD-10-CM

## 2015-08-26 DIAGNOSIS — N859 Noninflammatory disorder of uterus, unspecified: Secondary | ICD-10-CM | POA: Insufficient documentation

## 2015-08-26 DIAGNOSIS — R198 Other specified symptoms and signs involving the digestive system and abdomen: Secondary | ICD-10-CM | POA: Diagnosis not present

## 2015-08-26 NOTE — Telephone Encounter (Signed)
Pulled pelvic US report from [facility] and forwarded to collaborative nurse to have on call physician review so patient can be called today with the results.

## 2015-08-26 NOTE — Telephone Encounter (Signed)
  Oncology Nurse Navigator Documentation    Navigator Encounter Type: Telephone (08/26/15 1104): Mr. Filtz left VM reporting they see no notation in My chart that we had requested tech provide results of [place] today. Called to ask RN to contact radiology and inform them that Mykaylah wants her results immediately today (this note was placed in the appointment note section of the appointment). Called radiology and made tech aware of her request.

## 2015-08-26 NOTE — Telephone Encounter (Signed)
Per Merceda Elks RN Navigator, pt very anxious & would like to have results of [place].  Received OK to give result to pt from Dr Benay Spice.  Informed that report shows probable small fibroids with no acute findings. Husband was very appreciative of information & reports that pt's mother died this am & he buried his own mother recently & pt is a little distraught & requested that he take the information.  He will let her know the results.

## 2015-08-27 ENCOUNTER — Telehealth: Payer: Self-pay | Admitting: *Deleted

## 2015-08-27 NOTE — Telephone Encounter (Signed)
Called [facility] & gave verbal order for Xeloda 500 mg # 90 to take 3 tabs BID with radiation to pharmacist. No refills & will call to verify on tues if this will be Mon-Fri.  Reminded scheduler that pt wants to r/s appt for 08/31/15-message left on Melissa/Scheduler vm.

## 2015-08-30 ENCOUNTER — Other Ambulatory Visit: Payer: Self-pay | Admitting: Hematology

## 2015-08-31 ENCOUNTER — Telehealth: Payer: Self-pay | Admitting: Hematology

## 2015-08-31 ENCOUNTER — Encounter: Payer: Self-pay | Admitting: *Deleted

## 2015-08-31 ENCOUNTER — Encounter: Payer: Self-pay | Admitting: Pharmacist

## 2015-08-31 ENCOUNTER — Other Ambulatory Visit: Payer: Federal, State, Local not specified - PPO

## 2015-08-31 ENCOUNTER — Ambulatory Visit (HOSPITAL_BASED_OUTPATIENT_CLINIC_OR_DEPARTMENT_OTHER): Payer: Federal, State, Local not specified - PPO | Admitting: Hematology

## 2015-08-31 ENCOUNTER — Encounter: Payer: Self-pay | Admitting: Radiation Oncology

## 2015-08-31 ENCOUNTER — Encounter: Payer: Self-pay | Admitting: Hematology

## 2015-08-31 ENCOUNTER — Encounter: Payer: Federal, State, Local not specified - PPO | Admitting: Hematology

## 2015-08-31 VITALS — BP 117/70 | HR 89 | Temp 98.3°F | Resp 17 | Ht 68.0 in | Wt 134.6 lb

## 2015-08-31 DIAGNOSIS — C20 Malignant neoplasm of rectum: Secondary | ICD-10-CM

## 2015-08-31 DIAGNOSIS — Z5111 Encounter for antineoplastic chemotherapy: Secondary | ICD-10-CM

## 2015-08-31 DIAGNOSIS — Z510 Encounter for antineoplastic radiation therapy: Secondary | ICD-10-CM | POA: Diagnosis not present

## 2015-08-31 MED ORDER — ONDANSETRON HCL 8 MG PO TABS: 8.0000 mg | ORAL_TABLET | Freq: Three times a day (TID) | ORAL | Status: DC | PRN

## 2015-08-31 NOTE — Progress Notes (Signed)
This encounter was created in error - please disregard.

## 2015-08-31 NOTE — Telephone Encounter (Signed)
Per message from desk nurse r/s 1/3 ched/fu. Left message for patient re cancellation and new appointments for 1/5 @ 12:30 pm. Patient also mychart active.

## 2015-08-31 NOTE — Progress Notes (Signed)
Oral Chemotherapy Pharmacist Encounter   I spoke with patient for overview of new oral chemotherapy medication: Xeloda. Pt is doing well. The prescriptions have been sent to the [facility] but Copay was $787.01. [name] has federal commercial insurance through [employer] and her copay was much less expensive ($35 copay) through her Customer service manager. Rx faxed to [phone] on 08/27/15. Pt received medication on Saturday 08/28/15. She plans to start Xeloda and radiation on Wednesday 09/01/15.   Counseled patient on administration, dosing, side effects, safe handling, and monitoring. Side effects include but not limited to: Fatigue, nausea, vomiting, diarrhea, hand/foot syndrome, and mouth sores .  [name] voiced understanding and appreciation.   All questions answered.   Will follow up in 1 week for adherence and toxicity management.   Thank you,  Montel Clock, PharmD, [facility]

## 2015-08-31 NOTE — Telephone Encounter (Signed)
Talked to patient here in office. Scheduled appt.       [facility]. °

## 2015-09-01 ENCOUNTER — Ambulatory Visit
Admission: RE | Admit: 2015-09-01 | Discharge: 2015-09-01 | Disposition: A | Discharge status: Home / Self Care | Payer: Federal, State, Local not specified - PPO | Source: Ambulatory Visit | Attending: Radiation Oncology | Admitting: Radiation Oncology

## 2015-09-01 DIAGNOSIS — Z510 Encounter for antineoplastic radiation therapy: Secondary | ICD-10-CM | POA: Diagnosis not present

## 2015-09-02 ENCOUNTER — Other Ambulatory Visit: Payer: Federal, State, Local not specified - PPO

## 2015-09-02 ENCOUNTER — Ambulatory Visit: Payer: Federal, State, Local not specified - PPO | Admitting: Hematology

## 2015-09-02 ENCOUNTER — Ambulatory Visit
Admission: RE | Admit: 2015-09-02 | Discharge: 2015-09-02 | Disposition: A | Discharge status: Home / Self Care | Payer: Federal, State, Local not specified - PPO | Source: Ambulatory Visit | Attending: Radiation Oncology | Admitting: Radiation Oncology

## 2015-09-02 DIAGNOSIS — Z510 Encounter for antineoplastic radiation therapy: Secondary | ICD-10-CM | POA: Diagnosis not present

## 2015-09-03 ENCOUNTER — Ambulatory Visit
Admission: RE | Admit: 2015-09-03 | Discharge: 2015-09-03 | Disposition: A | Discharge status: Home / Self Care | Payer: Federal, State, Local not specified - PPO | Source: Ambulatory Visit | Attending: Radiation Oncology | Admitting: Radiation Oncology

## 2015-09-03 ENCOUNTER — Encounter: Payer: Self-pay | Admitting: Radiation Oncology

## 2015-09-03 VITALS — BP 126/72 | HR 97 | Temp 98.5°F | Resp 16 | Ht 68.0 in | Wt 137.4 lb

## 2015-09-03 DIAGNOSIS — Z510 Encounter for antineoplastic radiation therapy: Secondary | ICD-10-CM | POA: Diagnosis not present

## 2015-09-03 DIAGNOSIS — C20 Malignant neoplasm of rectum: Secondary | ICD-10-CM

## 2015-09-03 NOTE — Progress Notes (Signed)
Department of Radiation Oncology  Phone:  [phone] Fax:        [phone]  Weekly Treatment Note    Name: Molly Vang Date: 09/03/2015 MRN: [id number] DOB: [birth year]   Diagnosis:     ICD-9-CM ICD-10-CM   1. Rectal adenocarcinoma (HCC) 154.1 C20      Current dose: 5.4 Gy  Current fraction:3   MEDICATIONS: Current Outpatient Prescriptions  Medication Sig Dispense Refill  . aspirin EC 81 MG tablet Take 81 mg by mouth daily.    . Calcium Carbonate-Vitamin D (CALCIUM 600 + D PO) Take 1 tablet by mouth daily. Vit. D3 is 400iu    . capecitabine (XELODA) 500 MG tablet Take 3 tablets (1,500 mg total) by mouth 2 (two) times daily after a meal. 90 tablet 1  . fish oil-omega-3 fatty acids 1000 MG capsule Take 1 g by mouth daily. EPA 570mg   And DHA 130mg     . Multiple Vitamin (MULTIVITAMIN) tablet Take 1 tablet by mouth daily.    Marland Kitchen zolpidem (AMBIEN) 10 MG tablet Take 5 mg by mouth at bedtime as needed for sleep. Takes 1/2 10mg  tablet    . chlorhexidine (PERIDEX) 0.12 % solution Reported on 09/03/2015  98  . chlorpheniramine (CHLOR-TRIMETON) 4 MG tablet Take 4 mg by mouth 2 (two) times daily as needed for allergies. Reported on 09/03/2015    . naproxen sodium (ANAPROX) 220 MG tablet Take 220 mg by mouth daily as needed (for pain). Reported on 09/03/2015    . ondansetron (ZOFRAN) 8 MG tablet Take 1 tablet (8 mg total) by mouth every 8 (eight) hours as needed for nausea. (Patient not taking: Reported on 09/03/2015) 20 tablet 3   No current facility-administered medications for this encounter.     ALLERGIES: Review of patient's allergies indicates no known allergies.   LABORATORY DATA:  Lab Results  Component Value Date   WBC 9.2 06/03/2013   HGB 12.6 07/20/2015   HCT 37.9 06/03/2013   MCV 89.0 06/03/2013   PLT 300.0 06/03/2013   Lab Results  Component Value Date   NA 140 06/03/2013   K 3.7 06/03/2013   CL 105 06/03/2013   CO2 29 06/03/2013   Lab Results  Component  Value Date   ALT 27 05/19/2013   AST 33 05/19/2013   ALKPHOS 83 05/19/2013   BILITOT 0.2* 05/19/2013     NARRATIVE: Molly Vang was seen today for weekly treatment management. The chart was checked and the patient's films were reviewed.  Pt here for patient teaching.  Pt given Radiation and You booklet. Pt reports they have watched the Radiation Therapy Education video.  Reviewed areas of pertinence such as diarrhea, fatigue, hair loss, nausea and vomiting, skin changes and urinary and bladder changes . Pt able to give teach back of to pat skin, use unscented/gentle soap, use baby wipes, have Imodium on hand and drink plenty of water,avoid applying anything to skin within 4 hours of treatment. Pt demonstrated understanding and verbalizes understanding of information given and will contact nursing with any questions or concerns.     Http://[email or web address]/treatmentinformation/whattoexpect/index     PHYSICAL EXAMINATION: height is 5\' 8"  (1.727 m) and weight is 137 lb 6.4 oz (62.324 kg). Her oral temperature is 98.5 F (36.9 C). Her blood pressure is 126/72 and her pulse is 97. Her respiration is 16.        ASSESSMENT: The patient is doing satisfactorily with treatment.  PLAN: We will continue with the patient's radiation treatment as planned.

## 2015-09-03 NOTE — Progress Notes (Signed)
Iqra Suhr has completed 3 fractions to her pelvis.  She denies having pain and nausea.  She is taking Xeloda. She reports seeing less rectal bleeding.  She denies having any diarrhea and reports having regular bowel movements.  BP 126/72 mmHg  Pulse 97  Temp(Src) 98.5 F (36.9 C) (Oral)  Resp 16  Ht 5\' 8"  (1.727 m)  Wt 137 lb 6.4 oz (62.324 kg)  BMI 20.90 kg/m2

## 2015-09-03 NOTE — Progress Notes (Signed)
Pt here for patient teaching.  Pt given Radiation and You booklet. Pt reports they have watched the Radiation Therapy Education video.  Reviewed areas of pertinence such as diarrhea, fatigue, hair loss, nausea and vomiting, skin changes and urinary and bladder changes . Pt able to give teach back of to pat skin, use unscented/gentle soap, use baby wipes, have Imodium on hand and drink plenty of water,avoid applying anything to skin within 4 hours of treatment. Pt demonstrated understanding and verbalizes understanding of information given and will contact nursing with any questions or concerns.     Http://[email or web address]/treatmentinformation/whattoexpect/index

## 2015-09-04 ENCOUNTER — Encounter: Payer: Self-pay | Admitting: Hematology

## 2015-09-04 NOTE — Progress Notes (Signed)
[place]  [phone] [phone]  Clinic follow up Note   Patient Care Team: Arvella Nigh, MD as PCP - General (Obstetrics and Gynecology) [name], MD as Consulting Physician (General Surgery) Kyung Rudd, MD as Consulting Physician (Radiation Oncology) Mauri Pole, MD as Consulting Physician (Gastroenterology)   CHIEF COMPLAINTS:  Follow up rectal cancer   Oncology History   Rectal adenocarcinoma [facility])   Staging form: Colon and Rectum, AJCC 7th Edition     Clinical stage from 08/04/2015: Stage IIA (T3, N0, M0) - Signed by Truitt Merle, MD on 08/31/2015       Rectal adenocarcinoma ([place])   08/04/2015 Initial Biopsy rectal mass adenocarcinoma, descending colon polyp hyperplastic polyp.    08/04/2015 Procedure 5 cm rectal mass starting at 10 cm from anal verge extending to 15 cm, 42mm sigmoid polyp was removed    08/06/2015 Imaging CT chest, abdomen and pelvis w contrast showed a possible area of rectal wall thickening aterior to the distal sacrum, no adenopathy or distant metastasis    08/13/2015 Initial Diagnosis Rectal adenocarcinoma ([place])   08/19/2015 Procedure low EUS showed a T3N0 rectal mass    08/31/2015 -  Radiation Therapy radiation to rectal adenocarcinoma    08/31/2015 -  Chemotherapy Capecitabine 1500mg  q12h with concurrent radiation     HISTORY OF PRESENTING ILLNESS (08/13/2015):  Molly Vang 60 y.o. female is here because of newly diagnosed rectal adenocarcinoma. She is accompanied by her husband to our multidisciplinary GI clinic today.  She had a colonoscopy in 05/2013 by Dr. Olevia Perches which showed a 3-57mm sessile rectal polyp, which was removed, pathology revealed tubular adenoma. She has had intermittent rectal bleeding since Oct 2016, mild, she had mild constipation, no pain, nause or change of apeptie, no weihgt loss. She called Dr. Nichola Sizer office, who has recently retired, and was seen by Dr. Silverio Decamp. She underwent  colonoscopy on 08/04/2015, which showed a 5 cm rectal mass starting at a 10 cm from an approach extending to 15 cm, and a 3 mm sigmoid polyp which was removed. The rectal mass biopsy showed adenocarcinoma. CT of the chest abdomen and pelvis with contrast was performed on 08/06/2015, which showed no adenopathy or distant metastasis.  She otherwise feels well, no other complains. She is a homemaker, also works as a English as a second language teacher. She eats healthy diet, and walks 5 miles a day, very physically active.   She had a early stage skin melanoma on her right arm, was removed a few years ago. She also has a basal cell skin cancer on her face, will need a resection soon. She otherwise is very healthy. No family history of colon cancer or other malignancy.   CURRENT THERAPY: concurrent radiation and chemo with capecitabine 1500mg  q12h, started on 08/30/2014   INTERIM HISTORY Molly Vang returns for follow up and initiating chemoRT. She is doing very well, has no complains.   MEDICAL HISTORY:  Past Medical History  Diagnosis Date  . SVT (supraventricular tachycardia) ([place]) 06/11/2013    svt on 10/6// ablation on 10/15  . Cancer ([place])     melanoma right bicep removed    SURGICAL HISTORY: Past Surgical History  Procedure Laterality Date  . Tonsillectomy  1960  . US echocardiography  11/14/11    trace TR,MR  . Supraventricular tachycardia ablation  06/11/2013  . Ovarian cyst surgery  1993  . Supraventricular tachycardia ablation N/A 06/11/2013    Procedure: SUPRAVENTRICULAR TACHYCARDIA ABLATION;  Surgeon: Evans Lance, MD;  Location:  [facility];  Service: Cardiovascular;  Laterality: N/A;  . Nm myocar perf wall motion  12/14/2010    normal  . Colonoscopy    . Eus N/A 08/19/2015    Procedure: LOWER ENDOSCOPIC ULTRASOUND (EUS);  Surgeon: Milus Banister, MD;  Location: [facility];  Service: Endoscopy;  Laterality: N/A;    SOCIAL HISTORY: Social History   Social History  . Marital Status:  Married    Spouse Name: N/A  . Number of Children: 2   . Years of Education: N/A   Occupational History  . She is a Materials engineer    Social History Main Topics  . Smoking status: Never Smoker   . Smokeless tobacco: Never Used  . Alcohol Use: Yes     Comment: 06/11/2013 "beer or glass of wine maybe once/month"  . Drug Use: No  . Sexual Activity: Yes   Other Topics Concern  . Not on file   Social History Narrative    FAMILY HISTORY: Family History  Problem Relation Age of Onset  . Hypertension Father   . Hyperlipidemia Father   . Hypertension Mother   . Hyperlipidemia Brother   . Colon cancer Neg Hx   . Stomach cancer Neg Hx   . Esophageal cancer Neg Hx   . Rectal cancer Neg Hx     ALLERGIES:  has No Known Allergies.  MEDICATIONS:  Current Outpatient Prescriptions  Medication Sig Dispense Refill  . aspirin EC 81 MG tablet Take 81 mg by mouth daily.    . Calcium Carbonate-Vitamin D (CALCIUM 600 + D PO) Take 1 tablet by mouth daily. Vit. D3 is 400iu    . chlorhexidine (PERIDEX) 0.12 % solution Reported on 09/03/2015  98  . chlorpheniramine (CHLOR-TRIMETON) 4 MG tablet Take 4 mg by mouth 2 (two) times daily as needed for allergies. Reported on 09/03/2015    . fish oil-omega-3 fatty acids 1000 MG capsule Take 1 g by mouth daily. EPA 570mg   And DHA 130mg     . Multiple Vitamin (MULTIVITAMIN) tablet Take 1 tablet by mouth daily.    . naproxen sodium (ANAPROX) 220 MG tablet Take 220 mg by mouth daily as needed (for pain). Reported on 09/03/2015    . zolpidem (AMBIEN) 10 MG tablet Take 5 mg by mouth at bedtime as needed for sleep. Takes 1/2 10mg  tablet    . capecitabine (XELODA) 500 MG tablet Take 3 tablets (1,500 mg total) by mouth 2 (two) times daily after a meal. 90 tablet 1  . ondansetron (ZOFRAN) 8 MG tablet Take 1 tablet (8 mg total) by mouth every 8 (eight) hours as needed for nausea. (Patient not taking: Reported on 09/03/2015) 20 tablet 3   No current facility-administered  medications for this visit.    REVIEW OF SYSTEMS:   Constitutional: Denies fevers, chills or abnormal night sweats Eyes: Denies blurriness of vision, double vision or watery eyes Ears, nose, mouth, throat, and face: Denies mucositis or sore throat Respiratory: Denies cough, dyspnea or wheezes Cardiovascular: Denies palpitation, chest discomfort or lower extremity swelling Gastrointestinal:  Denies nausea, heartburn or change in bowel habits Skin: Denies abnormal skin rashes Lymphatics: Denies new lymphadenopathy or easy bruising Neurological:Denies numbness, tingling or new weaknesses Behavioral/Psych: Mood is stable, no new changes  All other systems were reviewed with the patient and are negative.  PHYSICAL EXAMINATION: ECOG PERFORMANCE STATUS: 0 - Asymptomatic  Filed Vitals:   08/31/15 1514  BP: 117/70  Pulse: 89  Temp: 98.3 F (36.8 C)  Resp: 17   Filed Weights   08/31/15 1514  Weight: 134 lb 9.6 oz (61.054 kg)    GENERAL:alert, no distress and comfortable SKIN: skin color, texture, turgor are normal, no rashes or significant lesions EYES: normal, conjunctiva are pink and non-injected, sclera clear OROPHARYNX:no exudate, no erythema and lips, buccal mucosa, and tongue normal  NECK: supple, thyroid normal size, non-tender, without nodularity LYMPH:  no palpable lymphadenopathy in the cervical, axillary or inguinal LUNGS: clear to auscultation and percussion with normal breathing effort HEART: regular rate & rhythm and no murmurs and no lower extremity edema ABDOMEN:abdomen soft, non-tender and normal bowel sounds. Rectal exam showed no palpable rectal mass. Musculoskeletal:no cyanosis of digits and no clubbing  PSYCH: alert & oriented x 3 with fluent speech NEURO: no focal motor/sensory deficits  LABORATORY DATA:  I have reviewed the data as listed CBC Latest Ref Rng 07/20/2015 06/03/2013 05/19/2013  WBC 4.5 - 10.5 K/uL - 9.2 8.8  Hemoglobin 12.0 - 15.0 g/dL 12.6  12.8 12.9  Hematocrit 36.0 - 46.0 % - 37.9 37.5  Platelets 150.0 - 400.0 K/uL - 300.0 296    CMP Latest Ref Rng 08/04/2015 06/03/2013 05/19/2013  Glucose 70 - 99 mg/dL - 87 104(H)  BUN 6 - 23 mg/dL 9 12 14   Creatinine 0.40 - 1.20 mg/dL 0.70 0.8 0.78  Sodium 135 - 145 mEq/L - 140 138  Potassium 3.5 - 5.1 mEq/L - 3.7 4.9  Chloride 96 - 112 mEq/L - 105 105  CO2 19 - 32 mEq/L - 29 25  Calcium 8.4 - 10.5 mg/dL - 9.0 8.3(L)  Total Protein 6.0 - 8.3 g/dL - - 6.6  Total Bilirubin 0.3 - 1.2 mg/dL - - 0.2(L)  Alkaline Phos 39 - 117 U/L - - 83  AST 0 - 37 U/L - - 33  ALT 0 - 35 U/L - - 27    PATHOLOGY REPORT: Diagnosis 08/04/2015 1. Colon, polyp(s), descending - HYPERPLASTIC POLYP. NO ADENOMATOUS CHANGE OR MALIGNANCY 2. Colon, polyp(s), rectum mass - ADENOCARCINOMA. Microscopic Comment 2. The rectal mass is an invasive colorectal adenocarcinoma.   RADIOGRAPHIC STUDIES: I have personally reviewed the radiological images as listed and agreed with the findings in the report. US Transvaginal Non-ob  08/26/2015  CLINICAL DATA:  Rectal mass.  Check uterus and ovaries EXAM: TRANSABDOMINAL AND TRANSVAGINAL ULTRASOUND OF PELVIS TECHNIQUE: Both transabdominal and transvaginal ultrasound examinations of the pelvis were performed. Transabdominal technique was performed for global imaging of the pelvis including uterus, ovaries, adnexal regions, and pelvic cul-de-sac. It was necessary to proceed with endovaginal exam following the transabdominal exam to visualize the uterus, endometrium, ovaries and adnexa . COMPARISON:  CT 08/05/2015 FINDINGS: Uterus Measurements: 7.8 x 2.6 x 4.4 cm. Small hypoechoic area noted in the lower uterine segment measures up to 1.4 cm, likely small fibroid. Small hypoechoic area in the fundus measures up to 1.5 cm, likely small fibroid. Endometrium Thickness: 4 mm in thickness.  No focal abnormality visualized. Right ovary Measurements: 2.6 x 1.1 x 1.6 cm. Normal appearance/no  adnexal mass. Left ovary Measurements: 2.3 x 1.0 x 0.8 cm. Normal appearance/no adnexal mass. Other findings Trace free fluid in the pelvis IMPRESSION: Small hypoechoic areas in the uterus, most compatible with small fibroids. No acute findings. Electronically Signed   By: Rolm Baptise M.D.   On: 08/26/2015 13:00   US Pelvis Complete  08/26/2015  CLINICAL DATA:  Rectal mass.  Check uterus and ovaries EXAM: TRANSABDOMINAL AND TRANSVAGINAL ULTRASOUND OF PELVIS TECHNIQUE: Both  transabdominal and transvaginal ultrasound examinations of the pelvis were performed. Transabdominal technique was performed for global imaging of the pelvis including uterus, ovaries, adnexal regions, and pelvic cul-de-sac. It was necessary to proceed with endovaginal exam following the transabdominal exam to visualize the uterus, endometrium, ovaries and adnexa . COMPARISON:  CT 08/05/2015 FINDINGS: Uterus Measurements: 7.8 x 2.6 x 4.4 cm. Small hypoechoic area noted in the lower uterine segment measures up to 1.4 cm, likely small fibroid. Small hypoechoic area in the fundus measures up to 1.5 cm, likely small fibroid. Endometrium Thickness: 4 mm in thickness.  No focal abnormality visualized. Right ovary Measurements: 2.6 x 1.1 x 1.6 cm. Normal appearance/no adnexal mass. Left ovary Measurements: 2.3 x 1.0 x 0.8 cm. Normal appearance/no adnexal mass. Other findings Trace free fluid in the pelvis IMPRESSION: Small hypoechoic areas in the uterus, most compatible with small fibroids. No acute findings. Electronically Signed   By: Rolm Baptise M.D.   On: 08/26/2015 13:00   Mm Screening Breast Tomo Bilateral  08/11/2015  CLINICAL DATA:  Screening. EXAM: DIGITAL SCREENING BILATERAL MAMMOGRAM WITH 3D TOMO WITH CAD COMPARISON:  Previous exam(s). ACR Breast Density Category c: The breast tissue is heterogeneously dense, which may obscure small masses. FINDINGS: There are no findings suspicious for malignancy. Images were processed with CAD.  IMPRESSION: No mammographic evidence of malignancy. A result letter of this screening mammogram will be mailed directly to the patient. RECOMMENDATION: Screening mammogram in one year. (Code:SM-B-01Y) BI-RADS CATEGORY  1: Negative. Electronically Signed   By: Lajean Manes M.D.   On: 08/11/2015 13:26   COLONOSCOPY 08/04/2015 Dr. Silverio Decamp  COLON FINDINGS:  5 cm rectal mass starting at 10 cm from anal verge extending to 15 cm, friable ulcerated mucosa concerning for malignancy, multiple random biopsies were obtained. 3 mm polyp in sigmoid colon removed by cold biopsy forceps. Diverticulosis. Retroflexed views revealed internal hemorrhoids. The time to cecum = 0.7 Withdrawal time = 14.0 The scope was withdrawn and the procedure completed.  COMPLICATIONS: There were no immediate complications.  ENDOSCOPIC IMPRESSION: 5 cm rectal mass starting at 10 cm from anal verge extending to 15 cm, friable ulcerated mucosa concerning for malignancy, multiple random biopsies were obtained. 3 mm polyp in sigmoid colon removed by cold biopsy forceps. Diverticulosis RECOMMENDATIONS: 1. Await pathology results 2. CT chest, abdomen and pelvis with contrast Referral to surgery based on pathology and imaging results Repeat colonoscopy in 6-12 months eSigned: Harl Bowie, MD 08/04/15  EUS by Dr. Ardis Hughs  ENDOSCOPIC IMPRESSION: 08/19/2015 1. Non circumferential, 3cm long uT3N0 (Stage IIa) rectal adenocarcinoma with distal edge located 8cm from the anal verge. The lateral edges of the tumor were labeled with submucosal injection of SPOT following EUS examination. 2. The "asymmetric 3.2 x 2.6 cm soft tissue thickening involving the posterior right wall of the distal rectum at the anal verge (image 120) noted by CT scan was located. It is distinct from the malignant mass described above, was vaguely bordered and I suspect this is gynecologic in orgin.  RECOMMENDATIONS: She will likely benefit from neoadjuvant  chemo/XRT. Prefer more information about the soft tissue described in #2 above and will order pelvic ultrasound. Will communicate these results with Dr. Lisbeth Renshaw, [facility].   ASSESSMENT & PLAN:  60 year old Caucasian female, history of superficial skin melanoma and basal cell carcinoma, otherwise healthy and fit, presented with rectal bleeding.   1. Rectal adenocarcinoma, cT3N0M0 -I reviewed her colonoscopy finding and the surgical biopsy results in detail with her and  her husband. -her EUS showed a T3N0 disease, this was reviewed with her again -vaginal [place] result reviewed with her -I recommend neoadjuvant chemoRT with capecitabine. --Chemotherapy consent: Side effects including but does not not limited to, fatigue, nausea, vomiting, diarrhea, hair loss, neuropathy, fluid retention, renal and kidney dysfunction, neutropenic fever, needed for blood transfusion, bleeding, were discussed with patient in great detail. She agrees to proceed. -She is starting chemoRT tomorrow, capecitabine has been delivered  -will set up lab weekly and follow up with me or APP every 1-2 weeks  -She has many questions, I answered to her satisfaction.     Plan -lab weekly -see back in 1 and 3 weeks   All questions were answered. The patient knows to call the clinic with any problems, questions or concerns. I spent 25 minutes counseling the patient face to face. The total time spent in the appointment was 30 minutes and more than 50% was on counseling.    Truitt Merle  08/31/2015

## 2015-09-06 ENCOUNTER — Other Ambulatory Visit: Payer: Self-pay | Admitting: *Deleted

## 2015-09-06 ENCOUNTER — Other Ambulatory Visit: Payer: Self-pay | Admitting: Hematology

## 2015-09-06 ENCOUNTER — Ambulatory Visit
Admission: RE | Admit: 2015-09-06 | Discharge: 2015-09-06 | Disposition: A | Discharge status: Home / Self Care | Payer: Federal, State, Local not specified - PPO | Source: Ambulatory Visit | Attending: Radiation Oncology | Admitting: Radiation Oncology

## 2015-09-06 DIAGNOSIS — C20 Malignant neoplasm of rectum: Secondary | ICD-10-CM

## 2015-09-06 DIAGNOSIS — Z510 Encounter for antineoplastic radiation therapy: Secondary | ICD-10-CM | POA: Diagnosis not present

## 2015-09-06 DIAGNOSIS — D126 Benign neoplasm of colon, unspecified: Secondary | ICD-10-CM

## 2015-09-07 ENCOUNTER — Ambulatory Visit (HOSPITAL_BASED_OUTPATIENT_CLINIC_OR_DEPARTMENT_OTHER): Payer: Federal, State, Local not specified - PPO | Admitting: Hematology

## 2015-09-07 ENCOUNTER — Ambulatory Visit: Payer: Federal, State, Local not specified - PPO | Admitting: Hematology

## 2015-09-07 ENCOUNTER — Telehealth: Payer: Self-pay | Admitting: Nurse Practitioner

## 2015-09-07 ENCOUNTER — Ambulatory Visit
Admission: RE | Admit: 2015-09-07 | Discharge: 2015-09-07 | Disposition: A | Discharge status: Home / Self Care | Payer: Federal, State, Local not specified - PPO | Source: Ambulatory Visit | Attending: Radiation Oncology | Admitting: Radiation Oncology

## 2015-09-07 ENCOUNTER — Other Ambulatory Visit (HOSPITAL_BASED_OUTPATIENT_CLINIC_OR_DEPARTMENT_OTHER): Payer: Federal, State, Local not specified - PPO

## 2015-09-07 ENCOUNTER — Other Ambulatory Visit: Payer: Federal, State, Local not specified - PPO

## 2015-09-07 ENCOUNTER — Encounter: Payer: Self-pay | Admitting: Hematology

## 2015-09-07 VITALS — BP 125/76 | HR 94 | Temp 98.4°F | Resp 20 | Ht 68.0 in | Wt 136.7 lb

## 2015-09-07 DIAGNOSIS — C20 Malignant neoplasm of rectum: Secondary | ICD-10-CM | POA: Diagnosis not present

## 2015-09-07 DIAGNOSIS — D126 Benign neoplasm of colon, unspecified: Secondary | ICD-10-CM

## 2015-09-07 DIAGNOSIS — Z85820 Personal history of malignant melanoma of skin: Secondary | ICD-10-CM

## 2015-09-07 DIAGNOSIS — Z85828 Personal history of other malignant neoplasm of skin: Secondary | ICD-10-CM | POA: Diagnosis not present

## 2015-09-07 DIAGNOSIS — Z510 Encounter for antineoplastic radiation therapy: Secondary | ICD-10-CM | POA: Diagnosis not present

## 2015-09-07 LAB — COMPREHENSIVE METABOLIC PANEL
ALBUMIN: 4 g/dL (ref 3.5–5.0)
ALK PHOS: 71 U/L (ref 40–150)
ALT: 57 U/L — ABNORMAL HIGH (ref 0–55)
AST: 26 U/L (ref 5–34)
Anion Gap: 9 mEq/L (ref 3–11)
BUN: 18.9 mg/dL (ref 7.0–26.0)
CALCIUM: 8.9 mg/dL (ref 8.4–10.4)
CO2: 27 mEq/L (ref 22–29)
Chloride: 104 mEq/L (ref 98–109)
Creatinine: 0.8 mg/dL (ref 0.6–1.1)
EGFR: 80 mL/min/{1.73_m2} — AB (ref >90)
Glucose: 96 mg/dl (ref 70–140)
POTASSIUM: 3.9 meq/L (ref 3.5–5.1)
SODIUM: 140 meq/L (ref 136–145)
Total Bilirubin: 0.34 mg/dL (ref 0.20–1.20)
Total Protein: 6.8 g/dL (ref 6.4–8.3)

## 2015-09-07 LAB — CBC WITH DIFFERENTIAL/PLATELET
BASO%: 1.1 % (ref 0.0–2.0)
Basophils Absolute: 0.1 10*3/uL (ref 0.0–0.1)
EOS ABS: 0.2 10*3/uL (ref 0.0–0.5)
EOS%: 4.5 % (ref 0.0–7.0)
HEMATOCRIT: 38.5 % (ref 34.8–46.6)
HEMOGLOBIN: 12.6 g/dL (ref 11.6–15.9)
LYMPH%: 19.4 % (ref 14.0–49.7)
MCH: 29.2 pg (ref 25.1–34.0)
MCHC: 32.6 g/dL (ref 31.5–36.0)
MCV: 89.6 fL (ref 79.5–101.0)
MONO#: 0.4 10*3/uL (ref 0.1–0.9)
MONO%: 7.4 % (ref 0.0–14.0)
NEUT%: 67.6 % (ref 38.4–76.8)
NEUTROS ABS: 3.7 10*3/uL (ref 1.5–6.5)
Platelets: 308 10*3/uL (ref 145–400)
RBC: 4.3 10*6/uL (ref 3.70–5.45)
RDW: 12.7 % (ref 11.2–14.5)
WBC: 5.5 10*3/uL (ref 3.9–10.3)
lymph#: 1.1 10*3/uL (ref 0.9–3.3)

## 2015-09-07 NOTE — Progress Notes (Signed)
[place]  [phone] [phone]  Clinic follow up Note   Patient Care Team: Arvella Nigh, MD as PCP - General (Obstetrics and Gynecology) [name], MD as Consulting Physician (General Surgery) Kyung Rudd, MD as Consulting Physician (Radiation Oncology) Mauri Pole, MD as Consulting Physician (Gastroenterology)   CHIEF COMPLAINTS:  Follow up rectal cancer   Oncology History   Rectal adenocarcinoma [facility])   Staging form: Colon and Rectum, AJCC 7th Edition     Clinical stage from 08/04/2015: Stage IIA (T3, N0, M0) - Signed by Truitt Merle, MD on 08/31/2015       Rectal adenocarcinoma ([place])   08/04/2015 Initial Biopsy rectal mass adenocarcinoma, descending colon polyp hyperplastic polyp.    08/04/2015 Procedure 5 cm rectal mass starting at 10 cm from anal verge extending to 15 cm, 72mm sigmoid polyp was removed    08/06/2015 Imaging CT chest, abdomen and pelvis w contrast showed a possible area of rectal wall thickening aterior to the distal sacrum, no adenopathy or distant metastasis    08/13/2015 Initial Diagnosis Rectal adenocarcinoma ([place])   08/19/2015 Procedure low EUS showed a T3N0 rectal mass    08/31/2015 -  Radiation Therapy radiation to rectal adenocarcinoma    08/31/2015 -  Chemotherapy Capecitabine 1500mg  q12h with concurrent radiation     HISTORY OF PRESENTING ILLNESS (08/13/2015):  Elon Spanner 60 y.o. female is here because of newly diagnosed rectal adenocarcinoma. She is accompanied by her husband to our multidisciplinary GI clinic today.  She had a colonoscopy in 05/2013 by Dr. Olevia Perches which showed a 3-81mm sessile rectal polyp, which was removed, pathology revealed tubular adenoma. She has had intermittent rectal bleeding since Oct 2016, mild, she had mild constipation, no pain, nause or change of apeptie, no weihgt loss. She called Dr. Nichola Sizer office, who has recently retired, and was seen by Dr. Silverio Decamp. She underwent  colonoscopy on 08/04/2015, which showed a 5 cm rectal mass starting at a 10 cm from an approach extending to 15 cm, and a 3 mm sigmoid polyp which was removed. The rectal mass biopsy showed adenocarcinoma. CT of the chest abdomen and pelvis with contrast was performed on 08/06/2015, which showed no adenopathy or distant metastasis.  She otherwise feels well, no other complains. She is a homemaker, also works as a English as a second language teacher. She eats healthy diet, and walks 5 miles a day, very physically active.   She had a early stage skin melanoma on her right arm, was removed a few years ago. She also has a basal cell skin cancer on her face, will need a resection soon. She otherwise is very healthy. No family history of colon cancer or other malignancy.   CURRENT THERAPY: concurrent radiation and chemo with capecitabine 1500mg  q12h, started on 08/30/2014   INTERIM HISTORY Katana returns for follow up. She is stating her second week of concurrent chemotherapy and radiation. She has been tolerating treatment very well, her mild rectal pressure has improved, she still has mild intermittent rectal bleeding. She feels well overall, denies any pain, nausea, diarrhea, or other symptoms.  MEDICAL HISTORY:  Past Medical History  Diagnosis Date  . SVT (supraventricular tachycardia) ([place]) 06/11/2013    svt on 10/6// ablation on 10/15  . Cancer ([place])     melanoma right bicep removed    SURGICAL HISTORY: Past Surgical History  Procedure Laterality Date  . Tonsillectomy  1960  . US echocardiography  11/14/11    trace TR,MR  . Supraventricular tachycardia ablation  06/11/2013  . Ovarian cyst surgery  1993  . Supraventricular tachycardia ablation N/A 06/11/2013    Procedure: SUPRAVENTRICULAR TACHYCARDIA ABLATION;  Surgeon: Evans Lance, MD;  Location: [facility] CATH LAB;  Service: Cardiovascular;  Laterality: N/A;  . Nm myocar perf wall motion  12/14/2010    normal  . Colonoscopy    . Eus N/A 08/19/2015     Procedure: LOWER ENDOSCOPIC ULTRASOUND (EUS);  Surgeon: Milus Banister, MD;  Location: [facility] ENDOSCOPY;  Service: Endoscopy;  Laterality: N/A;    SOCIAL HISTORY: Social History   Social History  . Marital Status: Married    Spouse Name: N/A  . Number of Children: 2   . Years of Education: N/A   Occupational History  . She is a Materials engineer    Social History Main Topics  . Smoking status: Never Smoker   . Smokeless tobacco: Never Used  . Alcohol Use: Yes     Comment: 06/11/2013 "beer or glass of wine maybe once/month"  . Drug Use: No  . Sexual Activity: Yes   Other Topics Concern  . Not on file   Social History Narrative    FAMILY HISTORY: Family History  Problem Relation Age of Onset  . Hypertension Father   . Hyperlipidemia Father   . Hypertension Mother   . Hyperlipidemia Brother   . Colon cancer Neg Hx   . Stomach cancer Neg Hx   . Esophageal cancer Neg Hx   . Rectal cancer Neg Hx     ALLERGIES:  has No Known Allergies.  MEDICATIONS:  Current Outpatient Prescriptions  Medication Sig Dispense Refill  . aspirin EC 81 MG tablet Take 81 mg by mouth daily.    . Calcium Carbonate-Vitamin D (CALCIUM 600 + D PO) Take 1 tablet by mouth daily. Vit. D3 is 400iu    . capecitabine (XELODA) 500 MG tablet Take 3 tablets (1,500 mg total) by mouth 2 (two) times daily after a meal. 90 tablet 1  . chlorhexidine (PERIDEX) 0.12 % solution Reported on 09/03/2015  98  . chlorpheniramine (CHLOR-TRIMETON) 4 MG tablet Take 4 mg by mouth 2 (two) times daily as needed for allergies. Reported on 09/03/2015    . fish oil-omega-3 fatty acids 1000 MG capsule Take 1 g by mouth daily. EPA 570mg   And DHA 130mg     . Multiple Vitamin (MULTIVITAMIN) tablet Take 1 tablet by mouth daily.    . naproxen sodium (ANAPROX) 220 MG tablet Take 220 mg by mouth daily as needed (for pain). Reported on 09/03/2015    . ondansetron (ZOFRAN) 8 MG tablet Take 1 tablet (8 mg total) by mouth every 8 (eight) hours as needed  for nausea. 20 tablet 3  . zolpidem (AMBIEN) 10 MG tablet Take 5 mg by mouth at bedtime as needed for sleep. Takes 1/2 10mg  tablet     No current facility-administered medications for this visit.    REVIEW OF SYSTEMS:   Constitutional: Denies fevers, chills or abnormal night sweats Eyes: Denies blurriness of vision, double vision or watery eyes Ears, nose, mouth, throat, and face: Denies mucositis or sore throat Respiratory: Denies cough, dyspnea or wheezes Cardiovascular: Denies palpitation, chest discomfort or lower extremity swelling Gastrointestinal:  Denies nausea, heartburn or change in bowel habits Skin: Denies abnormal skin rashes Lymphatics: Denies new lymphadenopathy or easy bruising Neurological:Denies numbness, tingling or new weaknesses Behavioral/Psych: Mood is stable, no new changes  All other systems were reviewed with the patient and are negative.  PHYSICAL EXAMINATION: ECOG PERFORMANCE  STATUS: 0 - Asymptomatic  Filed Vitals:   09/07/15 1522  BP: 125/76  Pulse: 94  Temp: 98.4 F (36.9 C)  Resp: 20   Filed Weights   09/07/15 1522  Weight: 136 lb 11.2 oz (62.007 kg)    GENERAL:alert, no distress and comfortable SKIN: skin color, texture, turgor are normal, no rashes or significant lesions EYES: normal, conjunctiva are pink and non-injected, sclera clear OROPHARYNX:no exudate, no erythema and lips, buccal mucosa, and tongue normal  NECK: supple, thyroid normal size, non-tender, without nodularity LYMPH:  no palpable lymphadenopathy in the cervical, axillary or inguinal LUNGS: clear to auscultation and percussion with normal breathing effort HEART: regular rate & rhythm and no murmurs and no lower extremity edema ABDOMEN:abdomen soft, non-tender and normal bowel sounds. Rectal exam showed no palpable rectal mass. Musculoskeletal:no cyanosis of digits and no clubbing  PSYCH: alert & oriented x 3 with fluent speech NEURO: no focal motor/sensory  deficits  LABORATORY DATA:  I have reviewed the data as listed CBC Latest Ref Rng 09/07/2015 07/20/2015 06/03/2013  WBC 3.9 - 10.3 10e3/uL 5.5 - 9.2  Hemoglobin 11.6 - 15.9 g/dL 12.6 12.6 12.8  Hematocrit 34.8 - 46.6 % 38.5 - 37.9  Platelets 145 - 400 10e3/uL 308 - 300.0    CMP Latest Ref Rng 09/07/2015 08/04/2015 06/03/2013  Glucose 70 - 140 mg/dl 96 - 87  BUN 7.0 - 26.0 mg/dL 18.9 9 12   Creatinine 0.6 - 1.1 mg/dL 0.8 0.70 0.8  Sodium 136 - 145 mEq/L 140 - 140  Potassium 3.5 - 5.1 mEq/L 3.9 - 3.7  Chloride 96 - 112 mEq/L - - 105  CO2 22 - 29 mEq/L 27 - 29  Calcium 8.4 - 10.4 mg/dL 8.9 - 9.0  Total Protein 6.4 - 8.3 g/dL 6.8 - -  Total Bilirubin 0.20 - 1.20 mg/dL 0.34 - -  Alkaline Phos 40 - 150 U/L 71 - -  AST 5 - 34 U/L 26 - -  ALT 0 - 55 U/L 57(H) - -    PATHOLOGY REPORT: Diagnosis 08/04/2015 1. Colon, polyp(s), descending - HYPERPLASTIC POLYP. NO ADENOMATOUS CHANGE OR MALIGNANCY 2. Colon, polyp(s), rectum mass - ADENOCARCINOMA. Microscopic Comment 2. The rectal mass is an invasive colorectal adenocarcinoma.   RADIOGRAPHIC STUDIES: I have personally reviewed the radiological images as listed and agreed with the findings in the report. US Transvaginal Non-ob  08/26/2015  CLINICAL DATA:  Rectal mass.  Check uterus and ovaries EXAM: TRANSABDOMINAL AND TRANSVAGINAL ULTRASOUND OF PELVIS TECHNIQUE: Both transabdominal and transvaginal ultrasound examinations of the pelvis were performed. Transabdominal technique was performed for global imaging of the pelvis including uterus, ovaries, adnexal regions, and pelvic cul-de-sac. It was necessary to proceed with endovaginal exam following the transabdominal exam to visualize the uterus, endometrium, ovaries and adnexa . COMPARISON:  CT 08/05/2015 FINDINGS: Uterus Measurements: 7.8 x 2.6 x 4.4 cm. Small hypoechoic area noted in the lower uterine segment measures up to 1.4 cm, likely small fibroid. Small hypoechoic area in the fundus measures  up to 1.5 cm, likely small fibroid. Endometrium Thickness: 4 mm in thickness.  No focal abnormality visualized. Right ovary Measurements: 2.6 x 1.1 x 1.6 cm. Normal appearance/no adnexal mass. Left ovary Measurements: 2.3 x 1.0 x 0.8 cm. Normal appearance/no adnexal mass. Other findings Trace free fluid in the pelvis IMPRESSION: Small hypoechoic areas in the uterus, most compatible with small fibroids. No acute findings. Electronically Signed   By: Rolm Baptise M.D.   On: 08/26/2015 13:00   US  Pelvis Complete  08/26/2015  CLINICAL DATA:  Rectal mass.  Check uterus and ovaries EXAM: TRANSABDOMINAL AND TRANSVAGINAL ULTRASOUND OF PELVIS TECHNIQUE: Both transabdominal and transvaginal ultrasound examinations of the pelvis were performed. Transabdominal technique was performed for global imaging of the pelvis including uterus, ovaries, adnexal regions, and pelvic cul-de-sac. It was necessary to proceed with endovaginal exam following the transabdominal exam to visualize the uterus, endometrium, ovaries and adnexa . COMPARISON:  CT 08/05/2015 FINDINGS: Uterus Measurements: 7.8 x 2.6 x 4.4 cm. Small hypoechoic area noted in the lower uterine segment measures up to 1.4 cm, likely small fibroid. Small hypoechoic area in the fundus measures up to 1.5 cm, likely small fibroid. Endometrium Thickness: 4 mm in thickness.  No focal abnormality visualized. Right ovary Measurements: 2.6 x 1.1 x 1.6 cm. Normal appearance/no adnexal mass. Left ovary Measurements: 2.3 x 1.0 x 0.8 cm. Normal appearance/no adnexal mass. Other findings Trace free fluid in the pelvis IMPRESSION: Small hypoechoic areas in the uterus, most compatible with small fibroids. No acute findings. Electronically Signed   By: Rolm Baptise M.D.   On: 08/26/2015 13:00   Mm Screening Breast Tomo Bilateral  08/11/2015  CLINICAL DATA:  Screening. EXAM: DIGITAL SCREENING BILATERAL MAMMOGRAM WITH 3D TOMO WITH CAD COMPARISON:  Previous exam(s). ACR Breast Density  Category c: The breast tissue is heterogeneously dense, which may obscure small masses. FINDINGS: There are no findings suspicious for malignancy. Images were processed with CAD. IMPRESSION: No mammographic evidence of malignancy. A result letter of this screening mammogram will be mailed directly to the patient. RECOMMENDATION: Screening mammogram in one year. (Code:SM-B-01Y) BI-RADS CATEGORY  1: Negative. Electronically Signed   By: Lajean Manes M.D.   On: 08/11/2015 13:26   COLONOSCOPY 08/04/2015 Dr. Silverio Decamp  COLON FINDINGS:  5 cm rectal mass starting at 10 cm from anal verge extending to 15 cm, friable ulcerated mucosa concerning for malignancy, multiple random biopsies were obtained. 3 mm polyp in sigmoid colon removed by cold biopsy forceps. Diverticulosis. Retroflexed views revealed internal hemorrhoids. The time to cecum = 0.7 Withdrawal time = 14.0 The scope was withdrawn and the procedure completed.  COMPLICATIONS: There were no immediate complications.  ENDOSCOPIC IMPRESSION: 5 cm rectal mass starting at 10 cm from anal verge extending to 15 cm, friable ulcerated mucosa concerning for malignancy, multiple random biopsies were obtained. 3 mm polyp in sigmoid colon removed by cold biopsy forceps. Diverticulosis RECOMMENDATIONS: 1. Await pathology results 2. CT chest, abdomen and pelvis with contrast Referral to surgery based on pathology and imaging results Repeat colonoscopy in 6-12 months eSigned: Harl Bowie, MD 08/04/15  EUS by Dr. Ardis Hughs  ENDOSCOPIC IMPRESSION: 08/19/2015 1. Non circumferential, 3cm long uT3N0 (Stage IIa) rectal adenocarcinoma with distal edge located 8cm from the anal verge. The lateral edges of the tumor were labeled with submucosal injection of SPOT following EUS examination. 2. The "asymmetric 3.2 x 2.6 cm soft tissue thickening involving the posterior right wall of the distal rectum at the anal verge (image 120) noted by CT scan was located. It is  distinct from the malignant mass described above, was vaguely bordered and I suspect this is gynecologic in orgin.  RECOMMENDATIONS: She will likely benefit from neoadjuvant chemo/XRT. Prefer more information about the soft tissue described in #2 above and will order pelvic ultrasound. Will communicate these results with Dr. Lisbeth Renshaw, [place], Gedalia Mcmillon, [place]:  60 year old Caucasian female, history of superficial skin melanoma and basal cell carcinoma, otherwise healthy and fit, presented  with rectal bleeding.   1. Rectal adenocarcinoma, cT3N0M0 -I reviewed her colonoscopy finding and the surgical biopsy results in detail with her and her husband. -her EUS showed a T3N0 disease, this was reviewed with her again -vaginal [place] result reviewed with her -I recommend neoadjuvant chemoRT with capecitabine. -She is tolerating treatment very well, lab reviewed, CBC and CMP are normal, continue concurrent chemoradiation.  2. Rectal bleeding  -mild and stable   Plan -Continue concurrent chemoradiation -lab weekly, we'll call her with the result next week. -see back in 2 weeks    All questions were answered. The patient knows to call the clinic with any problems, questions or concerns. I spent 10 minutes counseling the patient face to face. The total time spent in the appointment was 15 minutes and more than 50% was on counseling.    Truitt Merle  08/31/2015

## 2015-09-07 NOTE — Telephone Encounter (Signed)
Called and left a message with cindy bacon appt per dr Burr Medico

## 2015-09-08 ENCOUNTER — Ambulatory Visit
Admission: RE | Admit: 2015-09-08 | Discharge: 2015-09-08 | Disposition: A | Discharge status: Home / Self Care | Payer: Federal, State, Local not specified - PPO | Source: Ambulatory Visit | Attending: Radiation Oncology | Admitting: Radiation Oncology

## 2015-09-08 DIAGNOSIS — Z510 Encounter for antineoplastic radiation therapy: Secondary | ICD-10-CM | POA: Diagnosis not present

## 2015-09-09 ENCOUNTER — Ambulatory Visit
Admission: RE | Admit: 2015-09-09 | Discharge: 2015-09-09 | Disposition: A | Discharge status: Home / Self Care | Payer: Federal, State, Local not specified - PPO | Source: Ambulatory Visit | Attending: Radiation Oncology | Admitting: Radiation Oncology

## 2015-09-09 ENCOUNTER — Encounter: Payer: Self-pay | Admitting: Radiation Oncology

## 2015-09-09 ENCOUNTER — Telehealth: Payer: Self-pay | Admitting: *Deleted

## 2015-09-09 DIAGNOSIS — Z510 Encounter for antineoplastic radiation therapy: Secondary | ICD-10-CM | POA: Diagnosis not present

## 2015-09-09 NOTE — Telephone Encounter (Signed)
"  Dr. Burr Medico said lab results are nothing to worry about but I need someone to explain to me why there is an increase in my ALT  And EGFR.  They've doubled from six months ago.  You all should have results from other office.  I also know there is a ratio of the AST and ALT and ratio wasn't provided.  Did Xeloda metabolism cause this effect?  I am very meticulous and need reassurance.  Not an emergency but wouldl like a call.  Return number [phone] or [phone]".  "I also would like my weekly lab results and can come back to office within thirty minuites to get a copy every lab draw.  Can someone make these available to me."   At this time advised she come after an hour, go to [facility] To obtain copy as a STAT CMET results in an hour.  Will notify collaborative for other alternatives.  Only provider authorized to release to MyChart before scheduled release time.

## 2015-09-10 ENCOUNTER — Ambulatory Visit
Admission: RE | Admit: 2015-09-10 | Discharge: 2015-09-10 | Disposition: A | Discharge status: Home / Self Care | Payer: Federal, State, Local not specified - PPO | Source: Ambulatory Visit | Attending: Radiation Oncology | Admitting: Radiation Oncology

## 2015-09-10 ENCOUNTER — Encounter: Payer: Self-pay | Admitting: Radiation Oncology

## 2015-09-10 VITALS — BP 132/83 | HR 78 | Temp 98.2°F | Resp 20 | Wt 137.1 lb

## 2015-09-10 DIAGNOSIS — C20 Malignant neoplasm of rectum: Secondary | ICD-10-CM

## 2015-09-10 DIAGNOSIS — Z510 Encounter for antineoplastic radiation therapy: Secondary | ICD-10-CM | POA: Diagnosis not present

## 2015-09-10 NOTE — Progress Notes (Signed)
Went back in patient room ,let her know MDF hasn't forgotten her he will be in shortly, gave her a printed copy AVS with vitals," patient stated:I was wondering  About why he isn't her e yet my time is valuable too", apologized for her delay 11:30 AM

## 2015-09-10 NOTE — Progress Notes (Signed)
Weekly rad txs pelvis 7/28 completd, no c/o pain, diarrhea, no nuaea, takes Xeloda bid after meals, and doxycycline 38mmg bid till this Sunday, appetite good, energy level okay, not using sitz bath or babay wipes as yet, no rritation 11:07 AM BP 132/83 mmHg  Pulse 78  Temp(Src) 98.2 F (36.8 C) (Oral)  Resp 20  Wt 137 lb 1.6 oz (62.188 kg)  Wt Readings from Last 3 Encounters:  09/10/15 137 lb 1.6 oz (62.188 kg)  09/07/15 136 lb 11.2 oz (62.007 kg)  09/03/15 137 lb 6.4 oz (62.324 kg)

## 2015-09-10 NOTE — Progress Notes (Signed)
Department of Radiation Oncology  Phone:  [phone] Fax:        [phone]  Weekly Treatment Note    Name: Molly Vang Date: 09/10/2015 MRN: [id number] DOB: [birth year]   Diagnosis:     ICD-9-CM ICD-10-CM   1. Rectal adenocarcinoma (H[facility]) 154.1 C20      Current dose: 12.6 Gy  Current fraction: 7   MEDICATIONS: Current Outpatient Prescriptions  Medication Sig Dispense Refill  . Calcium Carbonate-Vitamin D (CALCIUM 600 + D PO) Take 1 tablet by mouth daily. Vit. D3 is 400iu    . capecitabine (XELODA) 500 MG tablet Take 3 tablets (1,500 mg total) by mouth 2 (two) times daily after a meal. 90 tablet 1  . chlorhexidine (PERIDEX) 0.12 % solution Reported on 09/03/2015  98  . chlorpheniramine (CHLOR-TRIMETON) 4 MG tablet Take 4 mg by mouth 2 (two) times daily as needed for allergies. Reported on 09/03/2015    . doxycycline (VIBRAMYCIN) 100 MG capsule Take 100 mg by mouth 2 (two) times daily.    . fish oil-omega-3 fatty acids 1000 MG capsule Take 1 g by mouth daily. EPA 570mg   And DHA 130mg     . Multiple Vitamin (MULTIVITAMIN) tablet Take 1 tablet by mouth daily.    Marland Kitchen zolpidem (AMBIEN) 10 MG tablet Take 5 mg by mouth at bedtime as needed for sleep. Takes 1/2 10mg  tablet    . aspirin EC 81 MG tablet Take 81 mg by mouth daily. Reported on 09/10/2015    . naproxen sodium (ANAPROX) 220 MG tablet Take 220 mg by mouth daily as needed (for pain). Reported on 09/10/2015    . ondansetron (ZOFRAN) 8 MG tablet Take 1 tablet (8 mg total) by mouth every 8 (eight) hours as needed for nausea. (Patient not taking: Reported on 09/10/2015) 20 tablet 3   No current facility-administered medications for this encounter.     ALLERGIES: Review of patient's allergies indicates no known allergies.   LABORATORY DATA:  Lab Results  Component Value Date   WBC 5.5 09/07/2015   HGB 12.6 09/07/2015   HCT 38.5 09/07/2015   MCV 89.6 09/07/2015   PLT 308 09/07/2015   Lab Results  Component Value Date   NA 140 09/07/2015   K 3.9 09/07/2015   CL 105 06/03/2013   CO2 27 09/07/2015   Lab Results  Component Value Date   ALT 57* 09/07/2015   AST 26 09/07/2015   ALKPHOS 71 09/07/2015   BILITOT 0.34 09/07/2015     NARRATIVE: Molly Vang was seen today for weekly treatment management. The chart was checked and the patient's films were reviewed.  Weekly rad txs pelvis 7/28 completd, no c/o pain, diarrhea, no nuaea, takes Xeloda bid after meals, and doxycycline 35mmg bid till this Sunday, appetite good, energy level okay, not using sitz bath or babay wipes as yet, no rritation 5:48 PM BP 132/83 mmHg  Pulse 78  Temp(Src) 98.2 F (36.8 C) (Oral)  Resp 20  Wt 137 lb 1.6 oz (62.188 kg)  Wt Readings from Last 3 Encounters:  09/10/15 137 lb 1.6 oz (62.188 kg)  09/07/15 136 lb 11.2 oz (62.007 kg)  09/03/15 137 lb 6.4 oz (62.324 kg)    PHYSICAL EXAMINATION: weight is 137 lb 1.6 oz (62.188 kg). Her oral temperature is 98.2 F (36.8 C). Her blood pressure is 132/83 and her pulse is 78. Her respiration is 20.        ASSESSMENT: The patient is doing satisfactorily with treatment.  PLAN: We  will continue with the patient's radiation treatment as planned.

## 2015-09-13 ENCOUNTER — Encounter: Payer: Self-pay | Admitting: Pharmacist

## 2015-09-13 ENCOUNTER — Other Ambulatory Visit: Payer: Self-pay | Admitting: *Deleted

## 2015-09-13 ENCOUNTER — Ambulatory Visit
Admission: RE | Admit: 2015-09-13 | Discharge: 2015-09-13 | Disposition: A | Discharge status: Home / Self Care | Payer: Federal, State, Local not specified - PPO | Source: Ambulatory Visit | Attending: Radiation Oncology | Admitting: Radiation Oncology

## 2015-09-13 DIAGNOSIS — Z510 Encounter for antineoplastic radiation therapy: Secondary | ICD-10-CM | POA: Diagnosis not present

## 2015-09-13 DIAGNOSIS — C20 Malignant neoplasm of rectum: Secondary | ICD-10-CM

## 2015-09-13 NOTE — Progress Notes (Signed)
Oral Chemotherapy Follow-Up Form  Original Start date of oral chemotherapy: _1/4/17__   Called patient today to follow up regarding patient's oral chemotherapy medication: __Xeloda + Radiation__  Pt is doing well today. No side effects or issues to report. No missed doses. Ms. Asante was out on a 4-mile walk this morning. Her energy levels are normal and she has a good appetite. Will monitor for cumulative effects of the radiation and xeloda. Currently she is feeling great   Pt reports _0__ tablets/doses missed in the last week/month.    Pt reports the following side effects: _none___   Will follow up and call patient again in _2 weeks_   Thank you,  Montel Clock, PharmD, [facility]

## 2015-09-14 ENCOUNTER — Ambulatory Visit
Admission: RE | Admit: 2015-09-14 | Discharge: 2015-09-14 | Disposition: A | Discharge status: Home / Self Care | Payer: Federal, State, Local not specified - PPO | Source: Ambulatory Visit | Attending: Radiation Oncology | Admitting: Radiation Oncology

## 2015-09-14 ENCOUNTER — Other Ambulatory Visit (HOSPITAL_BASED_OUTPATIENT_CLINIC_OR_DEPARTMENT_OTHER): Payer: Federal, State, Local not specified - PPO

## 2015-09-14 ENCOUNTER — Other Ambulatory Visit: Payer: Federal, State, Local not specified - PPO

## 2015-09-14 DIAGNOSIS — Z510 Encounter for antineoplastic radiation therapy: Secondary | ICD-10-CM | POA: Diagnosis not present

## 2015-09-14 DIAGNOSIS — C20 Malignant neoplasm of rectum: Secondary | ICD-10-CM | POA: Diagnosis not present

## 2015-09-14 LAB — CBC WITH DIFFERENTIAL/PLATELET
BASO%: 0.3 % (ref 0.0–2.0)
Basophils Absolute: 0 10*3/uL (ref 0.0–0.1)
EOS ABS: 0.3 10*3/uL (ref 0.0–0.5)
EOS%: 6 % (ref 0.0–7.0)
HCT: 39 % (ref 34.8–46.6)
HEMOGLOBIN: 12.7 g/dL (ref 11.6–15.9)
LYMPH#: 0.8 10*3/uL — AB (ref 0.9–3.3)
LYMPH%: 14.5 % (ref 14.0–49.7)
MCH: 29 pg (ref 25.1–34.0)
MCHC: 32.6 g/dL (ref 31.5–36.0)
MCV: 89.1 fL (ref 79.5–101.0)
MONO#: 0.4 10*3/uL (ref 0.1–0.9)
MONO%: 7.5 % (ref 0.0–14.0)
NEUT%: 71.7 % (ref 38.4–76.8)
NEUTROS ABS: 3.8 10*3/uL (ref 1.5–6.5)
PLATELETS: 304 10*3/uL (ref 145–400)
RBC: 4.37 10*6/uL (ref 3.70–5.45)
RDW: 12.6 % (ref 11.2–14.5)
WBC: 5.3 10*3/uL (ref 3.9–10.3)

## 2015-09-14 LAB — COMPREHENSIVE METABOLIC PANEL
ALBUMIN: 4.2 g/dL (ref 3.5–5.0)
ALK PHOS: 74 U/L (ref 40–150)
ALT: 37 U/L (ref 0–55)
ANION GAP: 7 meq/L (ref 3–11)
AST: 25 U/L (ref 5–34)
BILIRUBIN TOTAL: 0.44 mg/dL (ref 0.20–1.20)
BUN: 19.1 mg/dL (ref 7.0–26.0)
CO2: 29 meq/L (ref 22–29)
CREATININE: 0.8 mg/dL (ref 0.6–1.1)
Calcium: 9.3 mg/dL (ref 8.4–10.4)
Chloride: 104 mEq/L (ref 98–109)
EGFR: 80 mL/min/{1.73_m2} — ABNORMAL LOW (ref >90)
GLUCOSE: 101 mg/dL (ref 70–140)
Potassium: 3.9 mEq/L (ref 3.5–5.1)
Sodium: 140 mEq/L (ref 136–145)
TOTAL PROTEIN: 7 g/dL (ref 6.4–8.3)

## 2015-09-15 ENCOUNTER — Ambulatory Visit
Admission: RE | Admit: 2015-09-15 | Discharge: 2015-09-15 | Disposition: A | Discharge status: Home / Self Care | Payer: Federal, State, Local not specified - PPO | Source: Ambulatory Visit | Attending: Radiation Oncology | Admitting: Radiation Oncology

## 2015-09-15 DIAGNOSIS — Z510 Encounter for antineoplastic radiation therapy: Secondary | ICD-10-CM | POA: Diagnosis not present

## 2015-09-16 ENCOUNTER — Ambulatory Visit
Admission: RE | Admit: 2015-09-16 | Discharge: 2015-09-16 | Disposition: A | Discharge status: Home / Self Care | Payer: Federal, State, Local not specified - PPO | Source: Ambulatory Visit | Attending: Radiation Oncology | Admitting: Radiation Oncology

## 2015-09-16 DIAGNOSIS — Z510 Encounter for antineoplastic radiation therapy: Secondary | ICD-10-CM | POA: Diagnosis not present

## 2015-09-17 ENCOUNTER — Ambulatory Visit
Admission: RE | Admit: 2015-09-17 | Discharge: 2015-09-17 | Disposition: A | Discharge status: Home / Self Care | Payer: Federal, State, Local not specified - PPO | Source: Ambulatory Visit | Attending: Radiation Oncology | Admitting: Radiation Oncology

## 2015-09-17 ENCOUNTER — Encounter: Payer: Self-pay | Admitting: Radiation Oncology

## 2015-09-17 VITALS — BP 126/77 | HR 83 | Temp 98.0°F | Resp 18 | Ht 68.0 in | Wt 136.3 lb

## 2015-09-17 DIAGNOSIS — C20 Malignant neoplasm of rectum: Secondary | ICD-10-CM

## 2015-09-17 DIAGNOSIS — Z510 Encounter for antineoplastic radiation therapy: Secondary | ICD-10-CM | POA: Diagnosis not present

## 2015-09-17 NOTE — Progress Notes (Signed)
  Radiation Oncology         [phone] ________________________________  Name: Molly Vang MRN: [id number]  Date: 09/09/2015  DOB: 1955-08-30  COMPLEX SIMULATION  NOTE  Diagnosis: rectal cancer  Narrative The patient has initially been planned to receive a course of radiation treatment to a dose of 45 Gy in 25 fractions at 1.8 gray per fraction. The patient will now receive a boost to the high risk target volume for an additional 5.4 Gy. This will be delivered in 3 fractions at 1.8 Gy per fraction and a cone down boost technique will be utilized. To accomplish this, an additional 4 customized blocks have been designed for this purpose. A complex isodose plan is requested to ensure that the high-risk target region receives the appropriate radiation dose and that the nearby normal structures continue to be appropriately spared. The patient's final total dose therefore will be 50.4 Gy.   ________________________________ ------------------------------------------------  Jodelle Gross, MD, PhD

## 2015-09-17 NOTE — Progress Notes (Signed)
Molly Vang has completed 13 fractions to her pelvis.  She denies pain, fatigue, nausea, diarrhea or skin irritation.  She is taking Xeloda.  She reports she is walking 5 miles per day.  BP 126/77 mmHg  Pulse 83  Temp(Src) 98 F (36.7 C) (Oral)  Resp 18  Ht 5\' 8"  (1.727 m)  Wt 136 lb 4.8 oz (61.825 kg)  BMI 20.73 kg/m2   Wt Readings from Last 3 Encounters:  09/17/15 136 lb 4.8 oz (61.825 kg)  09/10/15 137 lb 1.6 oz (62.188 kg)  09/07/15 136 lb 11.2 oz (62.007 kg)

## 2015-09-17 NOTE — Progress Notes (Signed)
Department of Radiation Oncology  Phone:  [phone] Fax:        [phone]  Weekly Treatment Note    Name: Molly Vang Date: 09/17/2015 MRN: [id number] DOB: June 19, 1956   Diagnosis:     ICD-9-CM ICD-10-CM   1. Rectal adenocarcinoma (HCC) 154.1 C20      Current dose: 23.4 Gy  Current fraction: 13   MEDICATIONS: Current Outpatient Prescriptions  Medication Sig Dispense Refill  . aspirin EC 81 MG tablet Take 81 mg by mouth daily. Reported on 09/10/2015    . Calcium Carbonate-Vitamin D (CALCIUM 600 + D PO) Take 1 tablet by mouth daily. Vit. D3 is 400iu    . capecitabine (XELODA) 500 MG tablet Take 3 tablets (1,500 mg total) by mouth 2 (two) times daily after a meal. 90 tablet 1  . chlorhexidine (PERIDEX) 0.12 % solution Reported on 09/03/2015  98  . chlorpheniramine (CHLOR-TRIMETON) 4 MG tablet Take 4 mg by mouth 2 (two) times daily as needed for allergies. Reported on 09/03/2015    . fish oil-omega-3 fatty acids 1000 MG capsule Take 1 g by mouth daily. EPA 570mg   And DHA 130mg     . Multiple Vitamin (MULTIVITAMIN) tablet Take 1 tablet by mouth daily.    . naproxen sodium (ANAPROX) 220 MG tablet Take 220 mg by mouth daily as needed (for pain). Reported on 09/10/2015    . zolpidem (AMBIEN) 10 MG tablet Take 5 mg by mouth at bedtime as needed for sleep. Takes 1/2 10mg  tablet    . ondansetron (ZOFRAN) 8 MG tablet Take 1 tablet (8 mg total) by mouth every 8 (eight) hours as needed for nausea. (Patient not taking: Reported on 09/10/2015) 20 tablet 3   No current facility-administered medications for this encounter.     ALLERGIES: Review of patient's allergies indicates no known allergies.   LABORATORY DATA:  Lab Results  Component Value Date   WBC 5.3 09/14/2015   HGB 12.7 09/14/2015   HCT 39.0 09/14/2015   MCV 89.1 09/14/2015   PLT 304 09/14/2015   Lab Results  Component Value Date   NA 140 09/14/2015   K 3.9 09/14/2015   CL 105 06/03/2013   CO2 29 09/14/2015    Lab Results  Component Value Date   ALT 37 09/14/2015   AST 25 09/14/2015   ALKPHOS 74 09/14/2015   BILITOT 0.44 09/14/2015     NARRATIVE: Molly Vang was seen today for weekly treatment management. The chart was checked and the patient's films were reviewed.  She denies pain, fatigue, nausea, diarrhea, or skin irritation. She is taking Xeloda. She reports she is walking 5 miles per day. She reports not having any vaginal bleeding since January 12. She denies any complaints at this time.  4:55 PM BP 126/77 mmHg  Pulse 83  Temp(Src) 98 F (36.7 C) (Oral)  Resp 18  Ht 5\' 8"  (1.727 m)  Wt 136 lb 4.8 oz (61.825 kg)  BMI 20.73 kg/m2  Wt Readings from Last 3 Encounters:  09/17/15 136 lb 4.8 oz (61.825 kg)  09/10/15 137 lb 1.6 oz (62.188 kg)  09/07/15 136 lb 11.2 oz (62.007 kg)    PHYSICAL EXAMINATION: height is 5\' 8"  (1.727 m) and weight is 136 lb 4.8 oz (61.825 kg). Her oral temperature is 98 F (36.7 C). Her blood pressure is 126/77 and her pulse is 83. Her respiration is 18.        ASSESSMENT: The patient is doing satisfactorily with treatment.  PLAN: We  will continue with the patient's radiation treatment as planned.  This document serves as a record of services personally performed by Kyung Rudd, MD. It was created on his behalf by Darcus Austin, a trained medical scribe. The creation of this record is based on the scribe's personal observations and the provider's statements to them. This document has been checked and approved by the attending provider.

## 2015-09-20 ENCOUNTER — Ambulatory Visit
Admission: RE | Admit: 2015-09-20 | Discharge: 2015-09-20 | Disposition: A | Discharge status: Home / Self Care | Payer: Federal, State, Local not specified - PPO | Source: Ambulatory Visit | Attending: Radiation Oncology | Admitting: Radiation Oncology

## 2015-09-20 DIAGNOSIS — Z510 Encounter for antineoplastic radiation therapy: Secondary | ICD-10-CM | POA: Diagnosis not present

## 2015-09-21 ENCOUNTER — Ambulatory Visit (HOSPITAL_BASED_OUTPATIENT_CLINIC_OR_DEPARTMENT_OTHER): Payer: Federal, State, Local not specified - PPO | Admitting: Hematology

## 2015-09-21 ENCOUNTER — Other Ambulatory Visit: Payer: Federal, State, Local not specified - PPO

## 2015-09-21 ENCOUNTER — Ambulatory Visit
Admission: RE | Admit: 2015-09-21 | Discharge: 2015-09-21 | Disposition: A | Discharge status: Home / Self Care | Payer: Federal, State, Local not specified - PPO | Source: Ambulatory Visit | Attending: Radiation Oncology | Admitting: Radiation Oncology

## 2015-09-21 ENCOUNTER — Other Ambulatory Visit (HOSPITAL_BASED_OUTPATIENT_CLINIC_OR_DEPARTMENT_OTHER): Payer: Federal, State, Local not specified - PPO

## 2015-09-21 ENCOUNTER — Encounter: Payer: Self-pay | Admitting: *Deleted

## 2015-09-21 VITALS — BP 139/86 | HR 105 | Temp 98.6°F | Resp 18 | Ht 68.0 in | Wt 135.2 lb

## 2015-09-21 DIAGNOSIS — Z510 Encounter for antineoplastic radiation therapy: Secondary | ICD-10-CM | POA: Diagnosis not present

## 2015-09-21 DIAGNOSIS — C20 Malignant neoplasm of rectum: Secondary | ICD-10-CM

## 2015-09-21 DIAGNOSIS — K625 Hemorrhage of anus and rectum: Secondary | ICD-10-CM | POA: Diagnosis not present

## 2015-09-21 LAB — CBC WITH DIFFERENTIAL/PLATELET
BASO%: 0.2 % (ref 0.0–2.0)
Basophils Absolute: 0 10*3/uL (ref 0.0–0.1)
EOS%: 7.2 % — AB (ref 0.0–7.0)
Eosinophils Absolute: 0.5 10*3/uL (ref 0.0–0.5)
HCT: 39 % (ref 34.8–46.6)
HGB: 12.9 g/dL (ref 11.6–15.9)
LYMPH%: 7.1 % — AB (ref 14.0–49.7)
MCH: 29.6 pg (ref 25.1–34.0)
MCHC: 33 g/dL (ref 31.5–36.0)
MCV: 89.7 fL (ref 79.5–101.0)
MONO#: 0.6 10*3/uL (ref 0.1–0.9)
MONO%: 7.6 % (ref 0.0–14.0)
NEUT#: 5.9 10*3/uL (ref 1.5–6.5)
NEUT%: 77.9 % — AB (ref 38.4–76.8)
Platelets: 295 10*3/uL (ref 145–400)
RBC: 4.35 10*6/uL (ref 3.70–5.45)
RDW: 12.4 % (ref 11.2–14.5)
WBC: 7.5 10*3/uL (ref 3.9–10.3)
lymph#: 0.5 10*3/uL — ABNORMAL LOW (ref 0.9–3.3)

## 2015-09-21 NOTE — Progress Notes (Signed)
[facility]  [phone] [phone]  Clinic follow up Note   Patient Care Team: Arvella Nigh, MD as PCP - General (Obstetrics and Gynecology) [name], MD as Consulting Physician (General Surgery) Kyung Rudd, MD as Consulting Physician (Radiation Oncology) Mauri Pole, MD as Consulting Physician (Gastroenterology)   CHIEF COMPLAINTS:  Follow up rectal cancer   Oncology History   Rectal adenocarcinoma [facility])   Staging form: Colon and Rectum, AJCC 7th Edition     Clinical stage from 08/04/2015: Stage IIA (T3, N0, M0) - Signed by Truitt Merle, MD on 08/31/2015       Rectal adenocarcinoma ([place])   08/04/2015 Initial Biopsy rectal mass adenocarcinoma, descending colon polyp hyperplastic polyp.    08/04/2015 Procedure 5 cm rectal mass starting at 10 cm from anal verge extending to 15 cm, 83mm sigmoid polyp was removed    08/06/2015 Imaging CT chest, abdomen and pelvis w contrast showed a possible area of rectal wall thickening aterior to the distal sacrum, no adenopathy or distant metastasis    08/13/2015 Initial Diagnosis Rectal adenocarcinoma ([place])   08/19/2015 Procedure low EUS showed a T3N0 rectal mass    08/31/2015 -  Radiation Therapy radiation to rectal adenocarcinoma    08/31/2015 -  Chemotherapy Capecitabine 1500mg  q12h with concurrent radiation     HISTORY OF PRESENTING ILLNESS (08/13/2015):  Molly Vang 60 y.o. female is here because of newly diagnosed rectal adenocarcinoma. She is accompanied by her husband to our multidisciplinary GI clinic today.  She had a colonoscopy in 05/2013 by Dr. Olevia Perches which showed a 3-48mm sessile rectal polyp, which was removed, pathology revealed tubular adenoma. She has had intermittent rectal bleeding since Oct 2016, mild, she had mild constipation, no pain, nause or change of apeptie, no weihgt loss. She called Dr. Nichola Sizer office, who has recently retired, and was seen by Dr. Silverio Decamp. She underwent  colonoscopy on 08/04/2015, which showed a 5 cm rectal mass starting at a 10 cm from an approach extending to 15 cm, and a 3 mm sigmoid polyp which was removed. The rectal mass biopsy showed adenocarcinoma. CT of the chest abdomen and pelvis with contrast was performed on 08/06/2015, which showed no adenopathy or distant metastasis.  She otherwise feels well, no other complains. She is a homemaker, also works as a English as a second language teacher. She eats healthy diet, and walks 5 miles a day, very physically active.   She had a early stage skin melanoma on her right arm, was removed a few years ago. She also has a basal cell skin cancer on her face, will need a resection soon. She otherwise is very healthy. No family history of colon cancer or other malignancy.   CURRENT THERAPY: concurrent radiation and chemo with capecitabine 1500mg  q12h, started on 08/30/2014   INTERIM HISTORY Molly Vang returns for follow up. She is stating her 4th week of concurrent chemotherapy and radiation. She has been tolerating treatment very well, she has not had rectal bleeding in the past few weeks, no significant rectal pain, no bowel movements normal. She has good appetite and eating well, she has good energy level, worked for mouth yesterday, and 8 monos last week. She remains to be very physically active. She denies any other new symptoms.  MEDICAL HISTORY:  Past Medical History  Diagnosis Date  . SVT (supraventricular tachycardia) ([place]) 06/11/2013    svt on 10/6// ablation on 10/15  . Cancer ([place])     melanoma right bicep removed    SURGICAL HISTORY:  Past Surgical History  Procedure Laterality Date  . Tonsillectomy  1960  . US echocardiography  11/14/11    trace TR,MR  . Supraventricular tachycardia ablation  06/11/2013  . Ovarian cyst surgery  1993  . Supraventricular tachycardia ablation N/A 06/11/2013    Procedure: SUPRAVENTRICULAR TACHYCARDIA ABLATION;  Surgeon: Evans Lance, MD;  Location: [facility] CATH LAB;  Service:  Cardiovascular;  Laterality: N/A;  . Nm myocar perf wall motion  12/14/2010    normal  . Colonoscopy    . Eus N/A 08/19/2015    Procedure: LOWER ENDOSCOPIC ULTRASOUND (EUS);  Surgeon: Milus Banister, MD;  Location: [facility] ENDOSCOPY;  Service: Endoscopy;  Laterality: N/A;    SOCIAL HISTORY: Social History   Social History  . Marital Status: Married    Spouse Name: N/A  . Number of Children: 2   . Years of Education: N/A   Occupational History  . She is a Materials engineer    Social History Main Topics  . Smoking status: Never Smoker   . Smokeless tobacco: Never Used  . Alcohol Use: Yes     Comment: 06/11/2013 "beer or glass of wine maybe once/month"  . Drug Use: No  . Sexual Activity: Yes   Other Topics Concern  . Not on file   Social History Narrative    FAMILY HISTORY: Family History  Problem Relation Age of Onset  . Hypertension Father   . Hyperlipidemia Father   . Hypertension Mother   . Hyperlipidemia Brother   . Colon cancer Neg Hx   . Stomach cancer Neg Hx   . Esophageal cancer Neg Hx   . Rectal cancer Neg Hx     ALLERGIES:  has No Known Allergies.  MEDICATIONS:  Current Outpatient Prescriptions  Medication Sig Dispense Refill  . aspirin EC 81 MG tablet Take 81 mg by mouth daily. Reported on 09/10/2015    . Calcium Carbonate-Vitamin D (CALCIUM 600 + D PO) Take 1 tablet by mouth daily. Vit. D3 is 400iu    . capecitabine (XELODA) 500 MG tablet Take 3 tablets (1,500 mg total) by mouth 2 (two) times daily after a meal. 90 tablet 1  . chlorhexidine (PERIDEX) 0.12 % solution Reported on 09/03/2015  98  . chlorpheniramine (CHLOR-TRIMETON) 4 MG tablet Take 4 mg by mouth 2 (two) times daily as needed for allergies. Reported on 09/03/2015    . fish oil-omega-3 fatty acids 1000 MG capsule Take 1 g by mouth daily. EPA 570mg   And DHA 130mg     . ondansetron (ZOFRAN) 8 MG tablet Take 1 tablet (8 mg total) by mouth every 8 (eight) hours as needed for nausea. 20 tablet 3  . zolpidem  (AMBIEN) 10 MG tablet Take 5 mg by mouth at bedtime as needed for sleep. Takes 1/2 10mg  tablet    . naproxen sodium (ANAPROX) 220 MG tablet Take 220 mg by mouth daily as needed (for pain). Reported on 09/21/2015     No current facility-administered medications for this visit.    REVIEW OF SYSTEMS:   Constitutional: Denies fevers, chills or abnormal night sweats Eyes: Denies blurriness of vision, double vision or watery eyes Ears, nose, mouth, throat, and face: Denies mucositis or sore throat Respiratory: Denies cough, dyspnea or wheezes Cardiovascular: Denies palpitation, chest discomfort or lower extremity swelling Gastrointestinal:  Denies nausea, heartburn or change in bowel habits Skin: Denies abnormal skin rashes Lymphatics: Denies new lymphadenopathy or easy bruising Neurological:Denies numbness, tingling or new weaknesses Behavioral/Psych: Mood is stable, no new  changes  All other systems were reviewed with the patient and are negative.  PHYSICAL EXAMINATION: ECOG PERFORMANCE STATUS: 0 - Asymptomatic  Filed Vitals:   09/21/15 1536  BP: 139/86  Pulse: 105  Temp: 98.6 F (37 C)  Resp: 18   Filed Weights   09/21/15 1536  Weight: 135 lb 3.2 oz (61.326 kg)    GENERAL:alert, no distress and comfortable SKIN: skin color, texture, turgor are normal, no rashes or significant lesions EYES: normal, conjunctiva are pink and non-injected, sclera clear OROPHARYNX:no exudate, no erythema and lips, buccal mucosa, and tongue normal  NECK: supple, thyroid normal size, non-tender, without nodularity LYMPH:  no palpable lymphadenopathy in the cervical, axillary or inguinal LUNGS: clear to auscultation and percussion with normal breathing effort HEART: regular rate & rhythm and no murmurs and no lower extremity edema ABDOMEN:abdomen soft, non-tender and normal bowel sounds.  Musculoskeletal:no cyanosis of digits and no clubbing  PSYCH: alert & oriented x 3 with fluent speech NEURO: no  focal motor/sensory deficits  LABORATORY DATA:  I have reviewed the data as listed CBC Latest Ref Rng 09/21/2015 09/14/2015 09/07/2015  WBC 3.9 - 10.3 10e3/uL 7.5 5.3 5.5  Hemoglobin 11.6 - 15.9 g/dL 12.9 12.7 12.6  Hematocrit 34.8 - 46.6 % 39.0 39.0 38.5  Platelets 145 - 400 10e3/uL 295 304 308    CMP Latest Ref Rng 09/14/2015 09/07/2015 08/04/2015  Glucose 70 - 140 mg/dl 101 96 -  BUN 7.0 - 26.0 mg/dL 19.1 18.9 9  Creatinine 0.6 - 1.1 mg/dL 0.8 0.8 0.70  Sodium 136 - 145 mEq/L 140 140 -  Potassium 3.5 - 5.1 mEq/L 3.9 3.9 -  Chloride 96 - 112 mEq/L - - -  CO2 22 - 29 mEq/L 29 27 -  Calcium 8.4 - 10.4 mg/dL 9.3 8.9 -  Total Protein 6.4 - 8.3 g/dL 7.0 6.8 -  Total Bilirubin 0.20 - 1.20 mg/dL 0.44 0.34 -  Alkaline Phos 40 - 150 U/L 74 71 -  AST 5 - 34 U/L 25 26 -  ALT 0 - 55 U/L 37 57(H) -    PATHOLOGY REPORT: Diagnosis 08/04/2015 1. Colon, polyp(s), descending - HYPERPLASTIC POLYP. NO ADENOMATOUS CHANGE OR MALIGNANCY 2. Colon, polyp(s), rectum mass - ADENOCARCINOMA. Microscopic Comment 2. The rectal mass is an invasive colorectal adenocarcinoma.   RADIOGRAPHIC STUDIES: I have personally reviewed the radiological images as listed and agreed with the findings in the report. US Transvaginal Non-ob  08/26/2015  CLINICAL DATA:  Rectal mass.  Check uterus and ovaries EXAM: TRANSABDOMINAL AND TRANSVAGINAL ULTRASOUND OF PELVIS TECHNIQUE: Both transabdominal and transvaginal ultrasound examinations of the pelvis were performed. Transabdominal technique was performed for global imaging of the pelvis including uterus, ovaries, adnexal regions, and pelvic cul-de-sac. It was necessary to proceed with endovaginal exam following the transabdominal exam to visualize the uterus, endometrium, ovaries and adnexa . COMPARISON:  CT 08/05/2015 FINDINGS: Uterus Measurements: 7.8 x 2.6 x 4.4 cm. Small hypoechoic area noted in the lower uterine segment measures up to 1.4 cm, likely small fibroid. Small  hypoechoic area in the fundus measures up to 1.5 cm, likely small fibroid. Endometrium Thickness: 4 mm in thickness.  No focal abnormality visualized. Right ovary Measurements: 2.6 x 1.1 x 1.6 cm. Normal appearance/no adnexal mass. Left ovary Measurements: 2.3 x 1.0 x 0.8 cm. Normal appearance/no adnexal mass. Other findings Trace free fluid in the pelvis IMPRESSION: Small hypoechoic areas in the uterus, most compatible with small fibroids. No acute findings. Electronically Signed   By:  Rolm Baptise M.D.   On: 08/26/2015 13:00   US Pelvis Complete  08/26/2015  CLINICAL DATA:  Rectal mass.  Check uterus and ovaries EXAM: TRANSABDOMINAL AND TRANSVAGINAL ULTRASOUND OF PELVIS TECHNIQUE: Both transabdominal and transvaginal ultrasound examinations of the pelvis were performed. Transabdominal technique was performed for global imaging of the pelvis including uterus, ovaries, adnexal regions, and pelvic cul-de-sac. It was necessary to proceed with endovaginal exam following the transabdominal exam to visualize the uterus, endometrium, ovaries and adnexa . COMPARISON:  CT 08/05/2015 FINDINGS: Uterus Measurements: 7.8 x 2.6 x 4.4 cm. Small hypoechoic area noted in the lower uterine segment measures up to 1.4 cm, likely small fibroid. Small hypoechoic area in the fundus measures up to 1.5 cm, likely small fibroid. Endometrium Thickness: 4 mm in thickness.  No focal abnormality visualized. Right ovary Measurements: 2.6 x 1.1 x 1.6 cm. Normal appearance/no adnexal mass. Left ovary Measurements: 2.3 x 1.0 x 0.8 cm. Normal appearance/no adnexal mass. Other findings Trace free fluid in the pelvis IMPRESSION: Small hypoechoic areas in the uterus, most compatible with small fibroids. No acute findings. Electronically Signed   By: Rolm Baptise M.D.   On: 08/26/2015 13:00   COLONOSCOPY 08/04/2015 Dr. Silverio Decamp  COLON FINDINGS:  5 cm rectal mass starting at 10 cm from anal verge extending to 15 cm, friable ulcerated mucosa  concerning for malignancy, multiple random biopsies were obtained. 3 mm polyp in sigmoid colon removed by cold biopsy forceps. Diverticulosis. Retroflexed views revealed internal hemorrhoids. The time to cecum = 0.7 Withdrawal time = 14.0 The scope was withdrawn and the procedure completed.  COMPLICATIONS: There were no immediate complications.  ENDOSCOPIC IMPRESSION: 5 cm rectal mass starting at 10 cm from anal verge extending to 15 cm, friable ulcerated mucosa concerning for malignancy, multiple random biopsies were obtained. 3 mm polyp in sigmoid colon removed by cold biopsy forceps. Diverticulosis RECOMMENDATIONS: 1. Await pathology results 2. CT chest, abdomen and pelvis with contrast Referral to surgery based on pathology and imaging results Repeat colonoscopy in 6-12 months eSigned: Harl Bowie, MD 08/04/15  EUS by Dr. Ardis Hughs  ENDOSCOPIC IMPRESSION: 08/19/2015 1. Non circumferential, 3cm long uT3N0 (Stage IIa) rectal adenocarcinoma with distal edge located 8cm from the anal verge. The lateral edges of the tumor were labeled with submucosal injection of SPOT following EUS examination. 2. The "asymmetric 3.2 x 2.6 cm soft tissue thickening involving the posterior right wall of the distal rectum at the anal verge (image 120) noted by CT scan was located. It is distinct from the malignant mass described above, was vaguely bordered and I suspect this is gynecologic in orgin.  RECOMMENDATIONS: She will likely benefit from neoadjuvant chemo/XRT. Prefer more information about the soft tissue described in #2 above and will order pelvic ultrasound. Will communicate these results with Dr. Lisbeth Renshaw, [facility] [place].   ASSESSMENT & PLAN:  60 year old Caucasian female, history of superficial skin melanoma and basal cell carcinoma, otherwise healthy and fit, presented with rectal bleeding.   1. Rectal adenocarcinoma, cT3N0M0 -I reviewed her colonoscopy finding and the surgical biopsy  results in detail with her and her husband. -her EUS showed a T3N0 disease, this was reviewed with her again -She is tolerating neoadjuvant chemoRT with capecitabine very well, and her rectal bleeding has resolved now  -She is tolerating treatment very well, lab reviewed, CBC and CMP are unremarkable, continue concurrent chemoradiation.  2. Rectal bleeding  -resolved since she started chemoRT    Plan -Continue concurrent chemoradiation -lab weekly, we'll  call her with the result next week. -see back in 2 weeks    Pt had multiple questions about her diet and alternative medicine for cancer. I encouraged her to continue healthy diet and exercise which she is doing now, but do not start any other diet supplements or alternative medicine when she is on chemotherapy and radiation. She voiced good understanding and agrees.   She is overall appreciative to our care, but meantime also voiced some concern about her care at our cancer center. She expects [place] to call her with any, even nonsignificant, lab abnormalities. All questions were answered. The patient knows to call the clinic with any problems, questions or concerns.  I spent 20 minutes counseling the patient face to face. The total time spent in the appointment was 25 minutes and more than 50% was on counseling.    Truitt Merle  09/21/2015

## 2015-09-21 NOTE — Progress Notes (Signed)
Oncology Nurse Navigator Documentation  Oncology Nurse Navigator Flowsheets 09/21/2015  Navigator Location [facility]Med Onc  Navigator Encounter Type Other  Patient Visit Type MedOnc;Follow-up-1 month  Treatment Phase Treatment--RT and Xeloda (# 15)  Barriers/Navigation Needs No barriers at this time  Education -  Interventions Other--allowed patient to vent her frustration with navigator for not following up on her status and returning her calls.  Coordination of Care -  Acuity Level 1  Time Spent with Patient 15  Brief visit with patient prior to MD visit. Informed navigator that she was disappointed that her calls in December were not returned. She is aware that issues were taken care of, but navigator needs to call her to let her know she had taken care of the issue. Informed her that navigator will be more attentive and return calls promptly in future. Inquired how she was doing with treatment and she reports treatment is going well and she is having no side effects.

## 2015-09-22 ENCOUNTER — Encounter: Payer: Self-pay | Admitting: Hematology

## 2015-09-22 ENCOUNTER — Ambulatory Visit
Admission: RE | Admit: 2015-09-22 | Discharge: 2015-09-22 | Disposition: A | Discharge status: Home / Self Care | Payer: Federal, State, Local not specified - PPO | Source: Ambulatory Visit | Attending: Radiation Oncology | Admitting: Radiation Oncology

## 2015-09-22 DIAGNOSIS — Z510 Encounter for antineoplastic radiation therapy: Secondary | ICD-10-CM | POA: Diagnosis not present

## 2015-09-23 ENCOUNTER — Other Ambulatory Visit: Payer: Self-pay | Admitting: Hematology

## 2015-09-23 ENCOUNTER — Ambulatory Visit
Admission: RE | Admit: 2015-09-23 | Discharge: 2015-09-23 | Disposition: A | Discharge status: Home / Self Care | Payer: Federal, State, Local not specified - PPO | Source: Ambulatory Visit | Attending: Radiation Oncology | Admitting: Radiation Oncology

## 2015-09-23 DIAGNOSIS — Z510 Encounter for antineoplastic radiation therapy: Secondary | ICD-10-CM | POA: Diagnosis not present

## 2015-09-24 ENCOUNTER — Ambulatory Visit
Admission: RE | Admit: 2015-09-24 | Discharge: 2015-09-24 | Disposition: A | Discharge status: Home / Self Care | Payer: Federal, State, Local not specified - PPO | Source: Ambulatory Visit | Attending: Radiation Oncology | Admitting: Radiation Oncology

## 2015-09-24 ENCOUNTER — Encounter: Payer: Self-pay | Admitting: Radiation Oncology

## 2015-09-24 ENCOUNTER — Telehealth: Payer: Self-pay | Admitting: Hematology

## 2015-09-24 VITALS — BP 117/76 | HR 92 | Temp 98.3°F | Ht 68.0 in | Wt 135.1 lb

## 2015-09-24 DIAGNOSIS — C20 Malignant neoplasm of rectum: Secondary | ICD-10-CM

## 2015-09-24 DIAGNOSIS — Z510 Encounter for antineoplastic radiation therapy: Secondary | ICD-10-CM | POA: Diagnosis not present

## 2015-09-24 NOTE — Progress Notes (Signed)
Ms. Molly Vang has received 18 fractions to her pelvis.  She denies any proctitis, diarrhea, nor dysuria.

## 2015-09-24 NOTE — Telephone Encounter (Signed)
pt came by wanted to r/s appt-gave r/s appt time & date foer 3/7

## 2015-09-26 NOTE — Progress Notes (Signed)
   Department of Radiation Oncology  Phone:  [phone] Fax:        [phone]  Weekly Treatment Note    Name: Molly Vang Date: 09/26/2015 MRN: [id number] DOB: February 23, 1956   Diagnosis:     ICD-9-CM ICD-10-CM   1. Rectal adenocarcinoma (HCC) 154.1 C20      Current dose: 32.4 Gy  Current fraction: 18   MEDICATIONS: Current Outpatient Prescriptions  Medication Sig Dispense Refill  . aspirin EC 81 MG tablet Take 81 mg by mouth daily. Reported on 09/10/2015    . Calcium Carbonate-Vitamin D (CALCIUM 600 + D PO) Take 1 tablet by mouth daily. Vit. D3 is 400iu    . capecitabine (XELODA) 500 MG tablet Take 3 tablets (1,500 mg total) by mouth 2 (two) times daily after a meal. 90 tablet 1  . chlorhexidine (PERIDEX) 0.12 % solution Reported on 09/03/2015  98  . chlorpheniramine (CHLOR-TRIMETON) 4 MG tablet Take 4 mg by mouth 2 (two) times daily as needed for allergies. Reported on 09/03/2015    . fish oil-omega-3 fatty acids 1000 MG capsule Take 1 g by mouth daily. EPA 570mg   And DHA 130mg     . naproxen sodium (ANAPROX) 220 MG tablet Take 220 mg by mouth daily as needed (for pain). Reported on 09/21/2015    . ondansetron (ZOFRAN) 8 MG tablet Take 1 tablet (8 mg total) by mouth every 8 (eight) hours as needed for nausea. 20 tablet 3  . zolpidem (AMBIEN) 10 MG tablet Take 5 mg by mouth at bedtime as needed for sleep. Takes 1/2 10mg  tablet     No current facility-administered medications for this encounter.     ALLERGIES: Review of patient's allergies indicates no known allergies.   LABORATORY DATA:  Lab Results  Component Value Date   WBC 7.5 09/21/2015   HGB 12.9 09/21/2015   HCT 39.0 09/21/2015   MCV 89.7 09/21/2015   PLT 295 09/21/2015   Lab Results  Component Value Date   NA 140 09/14/2015   K 3.9 09/14/2015   CL 105 06/03/2013   CO2 29 09/14/2015   Lab Results  Component Value Date   ALT 37 09/14/2015   AST 25 09/14/2015   ALKPHOS 74 09/14/2015   BILITOT 0.44  09/14/2015     NARRATIVE: Molly Vang was seen today for weekly treatment management. The chart was checked and the patient's films were reviewed.  Molly Vang has received 18 fractions to her pelvis.  She denies any proctitis, diarrhea, nor dysuria.    PHYSICAL EXAMINATION: height is 5\' 8"  (1.727 m) and weight is 135 lb 1.6 oz (61.281 kg). Her temperature is 98.3 F (36.8 C). Her blood pressure is 117/76 and her pulse is 92.        ASSESSMENT: The patient is doing satisfactorily with treatment.  PLAN: We will continue with the patient's radiation treatment as planned. The patient is doing excellent currently. No changes at this time.

## 2015-09-27 ENCOUNTER — Telehealth: Payer: Self-pay | Admitting: Pharmacist

## 2015-09-27 ENCOUNTER — Telehealth: Payer: Self-pay | Admitting: *Deleted

## 2015-09-27 ENCOUNTER — Ambulatory Visit
Admission: RE | Admit: 2015-09-27 | Discharge: 2015-09-27 | Disposition: A | Discharge status: Home / Self Care | Payer: Federal, State, Local not specified - PPO | Source: Ambulatory Visit | Attending: Radiation Oncology | Admitting: Radiation Oncology

## 2015-09-27 DIAGNOSIS — Z510 Encounter for antineoplastic radiation therapy: Secondary | ICD-10-CM | POA: Diagnosis not present

## 2015-09-27 NOTE — Telephone Encounter (Signed)
09/27/15: Received a call from Ms. Joe this morning regarding some new onset abdominal pain/cramping (see previous triage note). Otherwise patient is doing well with no other symptoms. No nausea/vomiting, no constipation, bowels moving regularly (occasional mild diarrhea), no fever, no dysuria. This could be a side effect of xeloda (Abdominal pain seen in ~ 15% of patients) also concern for possible UTI. Patient is seeing her primary care today to obtain urinalysis and culture if needed. Ms. Care is also scheduled for weekly labs at [facility] tomorrow 09/28/15.   Instructed patient to call and let us know of urinalysis results.   Thank you,  Montel Clock, PharmD, [facility]

## 2015-09-27 NOTE — Telephone Encounter (Signed)
Molly Vang, could you call her back to see if she wants to be seen by [place] today or tomorrow after noon before her radiation. If she wants to come in today, please move her lab from tomorrow to today.  Thanks.  Truitt Merle  09/27/2015 .

## 2015-09-27 NOTE — Telephone Encounter (Signed)
Returned call to patient; states she just spoke with pharmacist and pain could be related to medicine.  Pt wants to "watch for now" and she will be in tomorrow for lab/radiation and states "I will call in am if I feel like I need to be seen."  Pt verbalized understanding to call if symptoms worsen.

## 2015-09-27 NOTE — Telephone Encounter (Signed)
Patient called stating that over the past two days she has developed cramping/tenderness in the abdomen. Patient is also complaining of "gas in bowel" that can't be relieved. Message forwarded to Amy H/MD [facility].

## 2015-09-28 ENCOUNTER — Other Ambulatory Visit: Payer: Self-pay | Admitting: *Deleted

## 2015-09-28 ENCOUNTER — Other Ambulatory Visit (HOSPITAL_BASED_OUTPATIENT_CLINIC_OR_DEPARTMENT_OTHER): Payer: Federal, State, Local not specified - PPO

## 2015-09-28 ENCOUNTER — Other Ambulatory Visit: Payer: Federal, State, Local not specified - PPO

## 2015-09-28 ENCOUNTER — Telehealth: Payer: Self-pay | Admitting: Nurse Practitioner

## 2015-09-28 ENCOUNTER — Ambulatory Visit (HOSPITAL_BASED_OUTPATIENT_CLINIC_OR_DEPARTMENT_OTHER): Payer: Federal, State, Local not specified - PPO | Admitting: Nurse Practitioner

## 2015-09-28 ENCOUNTER — Other Ambulatory Visit: Payer: Self-pay | Admitting: Nurse Practitioner

## 2015-09-28 ENCOUNTER — Ambulatory Visit
Admission: RE | Admit: 2015-09-28 | Discharge: 2015-09-28 | Disposition: A | Discharge status: Home / Self Care | Payer: Federal, State, Local not specified - PPO | Source: Ambulatory Visit | Attending: Radiation Oncology | Admitting: Radiation Oncology

## 2015-09-28 VITALS — BP 115/80 | HR 113 | Temp 98.6°F | Resp 18 | Wt 134.0 lb

## 2015-09-28 DIAGNOSIS — E860 Dehydration: Secondary | ICD-10-CM | POA: Diagnosis not present

## 2015-09-28 DIAGNOSIS — C20 Malignant neoplasm of rectum: Secondary | ICD-10-CM

## 2015-09-28 DIAGNOSIS — K625 Hemorrhage of anus and rectum: Secondary | ICD-10-CM

## 2015-09-28 DIAGNOSIS — R509 Fever, unspecified: Secondary | ICD-10-CM

## 2015-09-28 DIAGNOSIS — R1084 Generalized abdominal pain: Secondary | ICD-10-CM

## 2015-09-28 DIAGNOSIS — D126 Benign neoplasm of colon, unspecified: Secondary | ICD-10-CM

## 2015-09-28 DIAGNOSIS — R740 Nonspecific elevation of levels of transaminase and lactic acid dehydrogenase [LDH]: Secondary | ICD-10-CM | POA: Diagnosis not present

## 2015-09-28 DIAGNOSIS — R7401 Elevation of levels of liver transaminase levels: Secondary | ICD-10-CM

## 2015-09-28 DIAGNOSIS — Z510 Encounter for antineoplastic radiation therapy: Secondary | ICD-10-CM | POA: Diagnosis not present

## 2015-09-28 LAB — URINALYSIS, MICROSCOPIC - CHCC
BILIRUBIN (URINE): NEGATIVE
BLOOD: NEGATIVE
Glucose: NEGATIVE mg/dL
Ketones: NEGATIVE mg/dL
LEUKOCYTE ESTERASE: NEGATIVE
NITRITE: NEGATIVE
PH: 6 (ref 4.6–8.0)
PROTEIN: NEGATIVE mg/dL
RBC / HPF: NEGATIVE (ref 0–2)
Specific Gravity, Urine: 1.005 (ref 1.003–1.035)
Urobilinogen, UR: 0.2 mg/dL (ref 0.2–1)

## 2015-09-28 LAB — CBC WITH DIFFERENTIAL/PLATELET
BASO%: 0.3 % (ref 0.0–2.0)
BASOS ABS: 0 10*3/uL (ref 0.0–0.1)
EOS ABS: 0.1 10*3/uL (ref 0.0–0.5)
EOS%: 1.9 % (ref 0.0–7.0)
HCT: 36.3 % (ref 34.8–46.6)
HEMOGLOBIN: 12.6 g/dL (ref 11.6–15.9)
LYMPH%: 9.8 % — ABNORMAL LOW (ref 14.0–49.7)
MCH: 31 pg (ref 25.1–34.0)
MCHC: 34.7 g/dL (ref 31.5–36.0)
MCV: 89.2 fL (ref 79.5–101.0)
MONO#: 0.3 10*3/uL (ref 0.1–0.9)
MONO%: 9.2 % (ref 0.0–14.0)
NEUT#: 2.9 10*3/uL (ref 1.5–6.5)
NEUT%: 78.8 % — ABNORMAL HIGH (ref 38.4–76.8)
PLATELETS: 192 10*3/uL (ref 145–400)
RBC: 4.07 10*6/uL (ref 3.70–5.45)
RDW: 14.8 % — AB (ref 11.2–14.5)
WBC: 3.7 10*3/uL — ABNORMAL LOW (ref 3.9–10.3)
lymph#: 0.4 10*3/uL — ABNORMAL LOW (ref 0.9–3.3)

## 2015-09-28 LAB — COMPREHENSIVE METABOLIC PANEL
ALBUMIN: 3.5 g/dL (ref 3.5–5.0)
ALK PHOS: 99 U/L (ref 40–150)
ALT: 129 U/L — AB (ref 0–55)
ANION GAP: 8 meq/L (ref 3–11)
AST: 61 U/L — ABNORMAL HIGH (ref 5–34)
BILIRUBIN TOTAL: 0.86 mg/dL (ref 0.20–1.20)
BUN: 11.6 mg/dL (ref 7.0–26.0)
CALCIUM: 8.5 mg/dL (ref 8.4–10.4)
CO2: 24 meq/L (ref 22–29)
CREATININE: 0.8 mg/dL (ref 0.6–1.1)
Chloride: 102 mEq/L (ref 98–109)
EGFR: 78 mL/min/{1.73_m2} — AB (ref >90)
Glucose: 131 mg/dl (ref 70–140)
Potassium: 3.6 mEq/L (ref 3.5–5.1)
Sodium: 135 mEq/L — ABNORMAL LOW (ref 136–145)
TOTAL PROTEIN: 6.2 g/dL — AB (ref 6.4–8.3)

## 2015-09-28 NOTE — Telephone Encounter (Signed)
Currently scheduled today with [facility] before RT.

## 2015-09-28 NOTE — Telephone Encounter (Signed)
"  Who are the nurse practitioners?  Dr. Ernestina Penna nurse called me yesterday saying I could see a Nurse practtioner today and I think I do need to be seen.  Someone's calling me from the [facility]."  Ended call to answer other line.

## 2015-09-28 NOTE — Telephone Encounter (Signed)
Appointment made for today per 1/31 pof. Per Manuela Schwartz patient is aware of this appointment.

## 2015-09-28 NOTE — Telephone Encounter (Signed)
Oncology Nurse Navigator Documentation  Oncology Nurse Navigator Flowsheets 09/28/2015  Navigator Location [facility]  Navigator Encounter Type Telephone  Telephone Outgoing Call;Symptom Mgt---call to follow up on her cramping reported on 09/27/15  Patient Visit Type -  Treatment Phase Treatment  Barriers/Navigation Needs Coordination of Care  Education -  Interventions Coordination of Care--arranged for lab and to see NP today in symptom management clinic  Coordination of Care Appts  Acuity Level 1  Time Spent with Patient 15  Reports she had fever yesterday of 100.6--normal today. Feels her pulse is racing and her BP is low. Does not feel symptomatic from this. Abdominal cramping continues, but less today. Wishes to be seen today in symptom management clinic. Arrangements made.

## 2015-09-29 ENCOUNTER — Ambulatory Visit
Admission: RE | Admit: 2015-09-29 | Discharge: 2015-09-29 | Disposition: A | Discharge status: Home / Self Care | Payer: Federal, State, Local not specified - PPO | Source: Ambulatory Visit | Attending: Radiation Oncology | Admitting: Radiation Oncology

## 2015-09-29 ENCOUNTER — Telehealth: Payer: Self-pay | Admitting: *Deleted

## 2015-09-29 ENCOUNTER — Encounter: Payer: Self-pay | Admitting: Nurse Practitioner

## 2015-09-29 DIAGNOSIS — E860 Dehydration: Secondary | ICD-10-CM | POA: Insufficient documentation

## 2015-09-29 DIAGNOSIS — R509 Fever, unspecified: Secondary | ICD-10-CM | POA: Insufficient documentation

## 2015-09-29 DIAGNOSIS — R109 Unspecified abdominal pain: Secondary | ICD-10-CM | POA: Insufficient documentation

## 2015-09-29 DIAGNOSIS — R740 Nonspecific elevation of levels of transaminase and lactic acid dehydrogenase [LDH]: Secondary | ICD-10-CM

## 2015-09-29 DIAGNOSIS — R7401 Elevation of levels of liver transaminase levels: Secondary | ICD-10-CM | POA: Insufficient documentation

## 2015-09-29 DIAGNOSIS — Z510 Encounter for antineoplastic radiation therapy: Secondary | ICD-10-CM | POA: Diagnosis not present

## 2015-09-29 NOTE — Assessment & Plan Note (Signed)
Patient initiated Xeloda oral chemotherapy with concurrent radiation treatments approximately 20 days ago.  She reports complaint of mild increased generalized abdominal discomfort , cramping.  She denies any nausea or vomiting.  She states that her bowel movements remain regular; with her last bowel movement earlier this morning.  She states that her skin is tolerating the radiation treatments fairly well.  Labs obtained today did reveal transaminitis with AST elevated to 61 and ALT increased to 129.  Patient states that she has taken only one dose of Tylenol within the past week.    exam today revealed abdomen soft and bowel sounds positive in all 4 quads. Patient has some mild tenderness to the entire lower pelvis region with palpation.  There was no flank pain. There was no obvious liver area pain.  Most likely- elevated liver enzymes are secondary to the Xeloda.  After reviewing all findings with Dr. Burr Medico-  Decision was made to  To have the patient hold the Xeloda until she returns for recheck this coming Monday 10/04/2015. Patient was advised to continue with her daily radiation treatments, however.   also, patient was advised that the abdominal discomfort is most likely a combination of both the oral Xeloda therapy and radiation-induced gastritis.   Patient states that she does have pain medication to take at home if she needs it.

## 2015-09-29 NOTE — Telephone Encounter (Signed)
Dr. Lisbeth Renshaw would like her to continue radiation daily despite holding her Xeloda! Thanks, Bryson Ha

## 2015-09-29 NOTE — Progress Notes (Signed)
SYMPTOM MANAGEMENT CLINIC   HPI: Molly Vang 60 y.o. female diagnosed with rectal cancer.  Currently undergoing Xeloda oral therapy and radiation treatments.    Patient initiated Xeloda oral chemotherapy with concurrent radiation treatments approximately 20 days ago.  She reports complaint of mild increased generalized abdominal discomfort , cramping.  She denies any nausea or vomiting.  She states that her bowel movements remain regular; with her last bowel movement earlier this morning.  She states that her skin is tolerating the radiation treatments fairly well.  Labs obtained today did reveal transaminitis with AST elevated to 61 and ALT increased to 129.  Patient states that she has taken only one dose of Tylenol within the past week.    exam today revealed abdomen soft and bowel sounds positive in all 4 quads. Patient has some mild tenderness to the entire lower pelvis region with palpation.  There was no flank pain. There was no obvious liver area pain.  Most likely- elevated liver enzymes are secondary to the Xeloda.  After reviewing all findings with Dr. Burr Medico-  Decision was made to  To have the patient hold the Xeloda until she returns for recheck this coming Monday 10/04/2015. Patient was advised to continue with her daily radiation treatments, however.   also, patient was advised that the abdominal discomfort is most likely a combination of both the oral Xeloda therapy and radiation-induced gastritis.   Patient states that she does have pain medication to take at home if she needs it.  HPI  Review of Systems  Constitutional: Positive for fever and malaise/fatigue. Negative for chills.  Gastrointestinal: Positive for abdominal pain. Negative for nausea, vomiting, diarrhea and blood in stool.  Genitourinary: Negative.   Psychiatric/Behavioral: The patient is nervous/anxious.        Patient admits to feeling anxious and paranoid regarding her cancer diagnosis.  She requested  that all labs and updates be called to her as soon as possible.  She is requesting that even minimal changes in labs be reported to her as well.    Past Medical History  Diagnosis Date  . SVT (supraventricular tachycardia) ([place]) 06/11/2013    svt on 10/6// ablation on 10/15  . Cancer ([place])     melanoma right bicep removed    Past Surgical History  Procedure Laterality Date  . Tonsillectomy  1960  . US echocardiography  11/14/11    trace TR,MR  . Supraventricular tachycardia ablation  06/11/2013  . Ovarian cyst surgery  1993  . Supraventricular tachycardia ablation N/A 06/11/2013    Procedure: SUPRAVENTRICULAR TACHYCARDIA ABLATION;  Surgeon: Evans Lance, MD;  Location: [facility] CATH LAB;  Service: Cardiovascular;  Laterality: N/A;  . Nm myocar perf wall motion  12/14/2010    normal  . Colonoscopy    . Eus N/A 08/19/2015    Procedure: LOWER ENDOSCOPIC ULTRASOUND (EUS);  Surgeon: Milus Banister, MD;  Location: [facility] ENDOSCOPY;  Service: Endoscopy;  Laterality: N/A;    has SUPRAVENTRICULAR TACHYCARDIA ; Rectal bleeding; Tubular adenoma of colon; Rectal adenocarcinoma ([place]); Encounter for chemotherapy management; Fever; Dehydration; Transaminitis; and Abdominal pain on her problem list.    has No Known Allergies.    Medication List       This list is accurate as of: 09/28/15 11:59 PM.  Always use your most recent med list.               aspirin EC 81 MG tablet  Take 81 mg by mouth daily. Reported on 09/28/2015  CALCIUM 600 + D PO  Take 1 tablet by mouth daily. Vit. D3 is 400iu     capecitabine 500 MG tablet  Commonly known as:  XELODA  Take 3 tablets (1,500 mg total) by mouth 2 (two) times daily after a meal.     chlorhexidine 0.12 % solution  Commonly known as:  PERIDEX  Reported on 09/03/2015     chlorpheniramine 4 MG tablet  Commonly known as:  CHLOR-TRIMETON  Take 4 mg by mouth 2 (two) times daily as needed for allergies. Reported on 09/03/2015     fish oil-omega-3  fatty acids 1000 MG capsule  Take 1 g by mouth daily. EPA 528m  And DHA 1367m    naproxen sodium 220 MG tablet  Commonly known as:  ANAPROX  Take 220 mg by mouth daily as needed (for pain). Reported on 09/28/2015     ondansetron 8 MG tablet  Commonly known as:  ZOFRAN  Take 1 tablet (8 mg total) by mouth every 8 (eight) hours as needed for nausea.     zolpidem 10 MG tablet  Commonly known as:  AMBIEN  Take 5 mg by mouth at bedtime as needed for sleep. Reported on 09/28/2015         PHYSICAL EXAMINATION  Oncology Vitals 09/28/2015 09/24/2015  Height - 173 cm  Weight 60.782 kg 61.281 kg  Weight (lbs) 134 lbs 135 lbs 2 oz  BMI (kg/m2) - 20.54 kg/m2  Temp 98.6 98.3  Pulse 113 92  Resp 18 -  SpO2 100 -  BSA (m2) - 1.71 m2   BP Readings from Last 2 Encounters:  09/28/15 115/80  09/24/15 117/76    Physical Exam  Constitutional: She is oriented to person, place, and time and well-developed, well-nourished, and in no distress.  HENT:  Head: Normocephalic and atraumatic.  Mouth/Throat: Oropharynx is clear and moist.  Eyes: Conjunctivae and EOM are normal. Pupils are equal, round, and reactive to light. Right eye exhibits no discharge. Left eye exhibits no discharge. No scleral icterus.  Neck: Normal range of motion. Neck supple. No JVD present. No tracheal deviation present. No thyromegaly present.  Cardiovascular: Regular rhythm, normal heart sounds and intact distal pulses.   Tachycardia.  Pulmonary/Chest: Effort normal and breath sounds normal. No respiratory distress. She has no wheezes. She has no rales. She exhibits no tenderness.  Abdominal: Soft. Bowel sounds are normal. She exhibits no distension and no mass. There is tenderness. There is no rebound and no guarding.  Mild lower pelvic region tenderness with palpation.  Musculoskeletal: Normal range of motion. She exhibits no edema or tenderness.  Lymphadenopathy:    She has no cervical adenopathy.  Neurological: She is  alert and oriented to person, place, and time. Gait normal.  Skin: Skin is warm and dry. No rash noted. No erythema. No pallor.  Psychiatric:  Patient appears slightly anxious.  Nursing note and vitals reviewed.   LABORATORY DATA:. Appointment on 09/28/2015  Component Date Value Ref Range Status  . WBC 09/28/2015 3.7* 3.9 - 10.3 10e3/uL Final  . NEUT# 09/28/2015 2.9  1.5 - 6.5 10e3/uL Final  . HGB 09/28/2015 12.6  11.6 - 15.9 g/dL Final  . HCT 09/28/2015 36.3  34.8 - 46.6 % Final  . Platelets 09/28/2015 192  145 - 400 10e3/uL Final  . MCV 09/28/2015 89.2  79.5 - 101.0 fL Final  . MCH 09/28/2015 31.0  25.1 - 34.0 pg Final  . MCHC 09/28/2015 34.7  31.5 - 36.0  g/dL Final  . RBC 09/28/2015 4.07  3.70 - 5.45 10e6/uL Final  . RDW 09/28/2015 14.8* 11.2 - 14.5 % Final  . lymph# 09/28/2015 0.4* 0.9 - 3.3 10e3/uL Final  . MONO# 09/28/2015 0.3  0.1 - 0.9 10e3/uL Final  . Eosinophils Absolute 09/28/2015 0.1  0.0 - 0.5 10e3/uL Final  . Basophils Absolute 09/28/2015 0.0  0.0 - 0.1 10e3/uL Final  . NEUT% 09/28/2015 78.8* 38.4 - 76.8 % Final  . LYMPH% 09/28/2015 9.8* 14.0 - 49.7 % Final  . MONO% 09/28/2015 9.2  0.0 - 14.0 % Final  . EOS% 09/28/2015 1.9  0.0 - 7.0 % Final  . BASO% 09/28/2015 0.3  0.0 - 2.0 % Final  . Sodium 09/28/2015 135* 136 - 145 mEq/L Final  . Potassium 09/28/2015 3.6  3.5 - 5.1 mEq/L Final  . Chloride 09/28/2015 102  98 - 109 mEq/L Final  . CO2 09/28/2015 24  22 - 29 mEq/L Final  . Glucose 09/28/2015 131  70 - 140 mg/dl Final   Glucose reference range is for nonfasting patients. Fasting glucose reference range is 70- 100.  Marland Kitchen BUN 09/28/2015 11.6  7.0 - 26.0 mg/dL Final  . Creatinine 09/28/2015 0.8  0.6 - 1.1 mg/dL Final  . Total Bilirubin 09/28/2015 0.86  0.20 - 1.20 mg/dL Final  . Alkaline Phosphatase 09/28/2015 99  40 - 150 U/L Final  . AST 09/28/2015 61* 5 - 34 U/L Final  . ALT 09/28/2015 129* 0 - 55 U/L Final  . Total Protein 09/28/2015 6.2* 6.4 - 8.3 g/dL Final  .  Albumin 09/28/2015 3.5  3.5 - 5.0 g/dL Final  . Calcium 09/28/2015 8.5  8.4 - 10.4 mg/dL Final  . Anion Gap 09/28/2015 8  3 - 11 mEq/L Final  . EGFR 09/28/2015 78* >90 ml/min/1.73 m2 Final   eGFR is calculated using the CKD-EPI Creatinine Equation (2009)  . Glucose 09/28/2015 Negative  Negative mg/dL Final  . Bilirubin (Urine) 09/28/2015 Negative  Negative Final  . Ketones 09/28/2015 Negative  Negative mg/dL Final  . Specific Gravity, Urine 09/28/2015 1.005  1.003 - 1.035 Final  . Blood 09/28/2015 Negative  Negative Final  . pH 09/28/2015 6.0  4.6 - 8.0 Final  . Protein 09/28/2015 Negative  Negative- <30 mg/dL Final  . Urobilinogen, UR 09/28/2015 0.2  0.2 - 1 mg/dL Final  . Nitrite 09/28/2015 Negative  Negative Final  . Leukocyte Esterase 09/28/2015 Negative  Negative Final  . RBC / HPF 09/28/2015 Negative  0 - 2 Final  . WBC, UA 09/28/2015 0-2  0 - 2 Final  . Epithelial Cells 09/28/2015 Few  Negative- Few Final     RADIOGRAPHIC STUDIES: No results found.  ASSESSMENT/PLAN:    Transaminitis  Patient has been taking Xeloda oral chemotherapy for approximately 20 days.  She reports complaint of mild increased generalized abdominal discomfort , cramping.  She denies any nausea or vomiting.  She states that her bowel movements remain regular; with her last bowel movement earlier this morning.   Labs obtained today did reveal transaminitis with AST elevated to 61 and ALT increased to 129.  Patient states that she has taken only one dose of Tylenol within the past week.   Most likely- elevated liver enzymes are secondary to the Xeloda.  After reviewing all findings with Dr. Burr Medico-  Decision was made to  To have the patient hold the Xeloda until she returns for recheck this coming Monday 10/04/2015. Patient was advised to continue with her daily radiation  treatments, however.  Rectal adenocarcinoma [facility]) Patient initiated Xeloda oral chemotherapy with concurrent radiation treatments  approximately 20 days ago.  She reports complaint of mild increased generalized abdominal discomfort , cramping.  She denies any nausea or vomiting.  She states that her bowel movements remain regular; with her last bowel movement earlier this morning.  She states that her skin is tolerating the radiation treatments fairly well.  Labs obtained today did reveal transaminitis with AST elevated to 61 and ALT increased to 129.  Patient states that she has taken only one dose of Tylenol within the past week.    exam today revealed abdomen soft and bowel sounds positive in all 4 quads. Patient has some mild tenderness to the entire lower pelvis region with palpation.  There was no flank pain. There was no obvious liver area pain.  Most likely- elevated liver enzymes are secondary to the Xeloda.  After reviewing all findings with Dr. Burr Medico-  Decision was made to  To have the patient hold the Xeloda until she returns for recheck this coming Monday 10/04/2015. Patient was advised to continue with her daily radiation treatments, however.   also, patient was advised that the abdominal discomfort is most likely a combination of both the oral Xeloda therapy and radiation-induced gastritis.   Patient states that she does have pain medication to take at home if she needs it.     Fever  Patient states that she developed a temperature to maximum 100.6 within the past 24 hours.  She took Tylenol.  He last night, which did temporarily relieve her fever.  She denies any URI symptoms or UTI symptoms.    she does report some increasing lower /generalized abdominal discomfort.  She denies any nausea, vomiting, or diarrhea.  She states her bowels have been moving regularly.    urinalysis obtained today was completely normal. Blood cultures and urine culture results are pending results.   Labs obtained today revealed a stable white count.    temperature today while at the cancer center was 98.6.    patient appeared  nontoxic on exam today.  Patient was afebrile while at the cancer center; so will hold on initiation of any antibiotics until culture results are obtained.  Also, patient was advised to call/return or go directly to the emergency department for any worsening symptoms whatsoever.  Dehydration  Patient does appear mildly dehydrated with a sodium down to 135.  Patient was encouraged to push fluids at home is much as possible.  Abdominal pain Patient initiated Xeloda oral chemotherapy with concurrent radiation treatments approximately 20 days ago.  She reports complaint of mild increased generalized abdominal discomfort , cramping.  She denies any nausea or vomiting.  She states that her bowel movements remain regular; with her last bowel movement earlier this morning.  She states that her skin is tolerating the radiation treatments fairly well.  Labs obtained today did reveal transaminitis with AST elevated to 61 and ALT increased to 129.  Patient states that she has taken only one dose of Tylenol within the past week.    exam today revealed abdomen soft and bowel sounds positive in all 4 quads. Patient has some mild tenderness to the entire lower pelvis region with palpation.  There was no flank pain. There was no obvious liver area pain.  Most likely- elevated liver enzymes are secondary to the Xeloda.  After reviewing all findings with Dr. Burr Medico-  Decision was made to  To have the patient hold the  Xeloda until she returns for recheck this coming Monday 10/04/2015. Patient was advised to continue with her daily radiation treatments, however.   also, patient was advised that the abdominal discomfort is most likely a combination of both the oral Xeloda therapy and radiation-induced gastritis.   Patient states that she does have pain medication to take at home if she needs it.   Patient stated understanding of all instructions; and was in agreement with this plan of care. The patient knows to call  the clinic with any problems, questions or concerns.   Review/collaboration with Dr. Burr Medico regarding all aspects of patient's visit today.   Total time spent with patient was 40 minutes;  with greater than 75 percent of that time spent in face to face counseling regarding patient's symptoms,  and coordination of care and follow up.  Disclaimer:This dictation was prepared with Dragon/digital dictation along with [employer]. Any transcriptional errors that result from this process are unintentional.  Drue Second, NP 09/29/2015

## 2015-09-29 NOTE — Telephone Encounter (Signed)
Called pateint per Dr. Lisbeth Renshaw patient to still continue radaition treatmenrts even without taking Xelod the rest of this week, pateint agreed,  12:55 PM

## 2015-09-29 NOTE — Assessment & Plan Note (Signed)
Patient states that she developed a temperature to maximum 100.6 within the past 24 hours.  She took Tylenol.  He last night, which did temporarily relieve her fever.  She denies any URI symptoms or UTI symptoms.    she does report some increasing lower /generalized abdominal discomfort.  She denies any nausea, vomiting, or diarrhea.  She states her bowels have been moving regularly.    urinalysis obtained today was completely normal. Blood cultures and urine culture results are pending results.   Labs obtained today revealed a stable white count.    temperature today while at the cancer center was 98.6.    patient appeared nontoxic on exam today.  Patient was afebrile while at the cancer center; so will hold on initiation of any antibiotics until culture results are obtained.  Also, patient was advised to call/return or go directly to the emergency department for any worsening symptoms whatsoever.

## 2015-09-29 NOTE — Assessment & Plan Note (Signed)
Patient has been taking Xeloda oral chemotherapy for approximately 20 days.  She reports complaint of mild increased generalized abdominal discomfort , cramping.  She denies any nausea or vomiting.  She states that her bowel movements remain regular; with her last bowel movement earlier this morning.   Labs obtained today did reveal transaminitis with AST elevated to 61 and ALT increased to 129.  Patient states that she has taken only one dose of Tylenol within the past week.   Most likely- elevated liver enzymes are secondary to the Xeloda.  After reviewing all findings with Dr. Burr Medico-  Decision was made to  To have the patient hold the Xeloda until she returns for recheck this coming Monday 10/04/2015. Patient was advised to continue with her daily radiation treatments, however.

## 2015-09-29 NOTE — Telephone Encounter (Signed)
Patient called stating  She had blood work done  09/28/15 and her liver enzymes are elevated, Per Dr. Burr Medico, her Xeloda has been put on hold this Wednesday,Thursday and Friday,  Will have recheck blod work on Monday, she started having abdominal cramping tenderness and gas,  This past Saturday, was sen in symptom management 09/28/15, asking if she needs to put hold on Radiation the rest of this week, , will discuss with Dr. Lisbeth Renshaw and call her back   10:27 AM

## 2015-09-29 NOTE — Telephone Encounter (Signed)
LM on home number (generic greeting) to return call.  Please relay message to patient that preliminary lab results from 09/28/15 are all normal. No signs of infection.

## 2015-09-29 NOTE — Assessment & Plan Note (Signed)
Patient does appear mildly dehydrated with a sodium down to 135.  Patient was encouraged to push fluids at home is much as possible.

## 2015-09-30 ENCOUNTER — Other Ambulatory Visit: Payer: Self-pay | Admitting: *Deleted

## 2015-09-30 ENCOUNTER — Ambulatory Visit
Admission: RE | Admit: 2015-09-30 | Discharge: 2015-09-30 | Disposition: A | Discharge status: Home / Self Care | Payer: Federal, State, Local not specified - PPO | Source: Ambulatory Visit | Attending: Radiation Oncology | Admitting: Radiation Oncology

## 2015-09-30 ENCOUNTER — Telehealth: Payer: Self-pay | Admitting: *Deleted

## 2015-09-30 DIAGNOSIS — C20 Malignant neoplasm of rectum: Secondary | ICD-10-CM

## 2015-09-30 DIAGNOSIS — Z510 Encounter for antineoplastic radiation therapy: Secondary | ICD-10-CM | POA: Diagnosis not present

## 2015-09-30 LAB — URINE CULTURE

## 2015-09-30 NOTE — Telephone Encounter (Signed)
Received hand written letter from pt asking if she could be experiencing pancreatitis due to tightening in the center of her abdomen off & on & lower back aches.  She reports no fever or tenderness.  Discussed with Selena Lesser NP & agreed to check amylase & lipase on Monday with labs.  Called pt & informed that we would order these.  She asked if she could have her Vit D checked also & Cyndee agreed to check this also.  Informed pt that cultures, blood & urine were negative so far.  Pt expressed understanding & apreciation.

## 2015-09-30 NOTE — Telephone Encounter (Signed)
Pt returned call-lab results given

## 2015-10-01 ENCOUNTER — Ambulatory Visit
Admission: RE | Admit: 2015-10-01 | Discharge: 2015-10-01 | Disposition: A | Discharge status: Home / Self Care | Payer: Federal, State, Local not specified - PPO | Source: Ambulatory Visit | Attending: Radiation Oncology | Admitting: Radiation Oncology

## 2015-10-01 ENCOUNTER — Encounter: Payer: Self-pay | Admitting: Radiation Oncology

## 2015-10-01 VITALS — BP 107/70 | HR 95 | Temp 98.3°F | Ht 68.0 in | Wt 133.3 lb

## 2015-10-01 DIAGNOSIS — Z510 Encounter for antineoplastic radiation therapy: Secondary | ICD-10-CM | POA: Diagnosis not present

## 2015-10-01 DIAGNOSIS — C20 Malignant neoplasm of rectum: Secondary | ICD-10-CM

## 2015-10-01 NOTE — Progress Notes (Signed)
Mrs. Sung has received 23 fractions.  Skin color normal to abdominal area.  Had diarrhea yesterday for the first time.  No bladder changes.  Appetite no as good as it was in the past.  Energy level more tired than I was in the past.  No pain now; when she has pain it is 7/10. BP 107/70 mmHg  Pulse 95  Temp(Src) 98.3 F (36.8 C) (Oral)  Ht 5\' 8"  (1.727 m)  Wt 133 lb 4.8 oz (60.464 kg)  BMI 20.27 kg/m2  SpO2 100%  Wt Readings from Last 3 Encounters:  10/01/15 133 lb 4.8 oz (60.464 kg)  09/28/15 134 lb (60.782 kg)  09/24/15 135 lb 1.6 oz (61.281 kg)

## 2015-10-01 NOTE — Progress Notes (Signed)
Department of Radiation Oncology  Phone:  [phone] Fax:        [phone]  Weekly Treatment Note    Name: Molly Vang Date: 10/01/2015 MRN: [id number] DOB: 06/01/1956   Diagnosis:     ICD-9-CM ICD-10-CM   1. Rectal adenocarcinoma (HCC) 154.1 C20      Current dose: 41.4 Gy  Current fraction: 23   MEDICATIONS: Current Outpatient Prescriptions  Medication Sig Dispense Refill  . aspirin EC 81 MG tablet Take 81 mg by mouth daily. Reported on 09/28/2015    . Calcium Carbonate-Vitamin D (CALCIUM 600 + D PO) Take 1 tablet by mouth daily. Vit. D3 is 400iu    . chlorhexidine (PERIDEX) 0.12 % solution Reported on 09/03/2015  98  . chlorpheniramine (CHLOR-TRIMETON) 4 MG tablet Take 4 mg by mouth 2 (two) times daily as needed for allergies. Reported on 09/03/2015    . fish oil-omega-3 fatty acids 1000 MG capsule Take 1 g by mouth daily. EPA 570mg   And DHA 130mg     . naproxen sodium (ANAPROX) 220 MG tablet Take 220 mg by mouth daily as needed (for pain). Reported on 09/28/2015    . ondansetron (ZOFRAN) 8 MG tablet Take 1 tablet (8 mg total) by mouth every 8 (eight) hours as needed for nausea. 20 tablet 3  . zolpidem (AMBIEN) 10 MG tablet Take 5 mg by mouth at bedtime as needed for sleep. Reported on 09/28/2015    . capecitabine (XELODA) 500 MG tablet Take 3 tablets (1,500 mg total) by mouth 2 (two) times daily after a meal. (Patient not taking: Reported on 10/01/2015) 90 tablet 1   No current facility-administered medications for this encounter.     ALLERGIES: Review of patient's allergies indicates no known allergies.   LABORATORY DATA:  Lab Results  Component Value Date   WBC 3.7* 09/28/2015   HGB 12.6 09/28/2015   HCT 36.3 09/28/2015   MCV 89.2 09/28/2015   PLT 192 09/28/2015   Lab Results  Component Value Date   NA 135* 09/28/2015   K 3.6 09/28/2015   CL 105 06/03/2013   CO2 24 09/28/2015   Lab Results  Component Value Date   ALT 129* 09/28/2015   AST 61*  09/28/2015   ALKPHOS 99 09/28/2015   BILITOT 0.86 09/28/2015     NARRATIVE: Molly Vang was seen today for weekly treatment management. The chart was checked and the patient's films were reviewed.   On review of systems the patient reports that she has been concern about  Mid abdominal pain , she reports that she has been anxious about this and her cancer care in general. She states that this is been present for the last week and  (last Saturday. She states that she  Experienced diarrhea for the first time yesterday which responded to half of the tablet of immodium. She states that she has not had any sharp gas-like pains in her abdomen and describes her pain as crampy sensation. She didn't denies any particular activity that seems to correlate with her pain. She  Denies any dysuria hematuria, melena, or hematochezia. She is not experiencing any fevers or chills.  She is noticing that her fatigue and increasing since she began radiation. She denies any nausea or vomiting. She reports passing gas and having normal stool today. She continues to hold her Xeloda due to elevated liver enzymes earlier this week. Complete review of systems is obtained and is otherwise negative  PHYSICAL EXAMINATION: height is 5\' 8"  (1.727 m)  and weight is 133 lb 4.8 oz (60.464 kg). Her oral temperature is 98.3 F (36.8 C). Her blood pressure is 107/70 and her pulse is 95. Her oxygen saturation is 100%.      Pain scale 0/10   In general this is a well-appearing Caucasian female in no acute distress. She is alert and oriented times for appropriate throughout the examination.  Cardiovascular reveals a regular rate and rhythm, no clicks rubs or murmurs auscultated. Pulmonary assessment is negative for acute distress and she exhibits normal effort.  Evaluation of the abdomen reveals a scaphoid abdomen without any visible anomalies. Bowel sounds are active in all quadrants in the abdomen is soft, nontender, nondistended no  palpable hepatosplenomegaly or fascial defects are noted.  ASSESSMENT:   Stage IIA (T3, N0, M0) adenocarcinoma of the rectum.  PLAN: We will continue with the patient's radiation treatment as planned.  Dr. Lisbeth Renshaw discusses that he would recommend she continue to keep an eye on her symptoms in terms of abdominal pain, at this time, it is difficult to assess if this is related to bowel function versus musculoskeletal in nature. She will keep [place] informed of any changes in her symptoms.   Jodelle Gross, MD, PhD

## 2015-10-01 NOTE — Patient Instructions (Signed)
Contact our office if you have any questions following today's appointment: [phone].  

## 2015-10-04 ENCOUNTER — Encounter: Payer: Self-pay | Admitting: Hematology

## 2015-10-04 ENCOUNTER — Ambulatory Visit
Admission: RE | Admit: 2015-10-04 | Discharge: 2015-10-04 | Disposition: A | Discharge status: Home / Self Care | Payer: Federal, State, Local not specified - PPO | Source: Ambulatory Visit | Attending: Radiation Oncology | Admitting: Radiation Oncology

## 2015-10-04 ENCOUNTER — Ambulatory Visit (HOSPITAL_BASED_OUTPATIENT_CLINIC_OR_DEPARTMENT_OTHER): Payer: Federal, State, Local not specified - PPO | Admitting: Hematology

## 2015-10-04 ENCOUNTER — Other Ambulatory Visit: Payer: Self-pay | Admitting: *Deleted

## 2015-10-04 ENCOUNTER — Other Ambulatory Visit (HOSPITAL_BASED_OUTPATIENT_CLINIC_OR_DEPARTMENT_OTHER): Payer: Federal, State, Local not specified - PPO

## 2015-10-04 ENCOUNTER — Other Ambulatory Visit (HOSPITAL_COMMUNITY)
Admission: RE | Admit: 2015-10-04 | Discharge: 2015-10-04 | Disposition: A | Discharge status: Home / Self Care | Payer: Federal, State, Local not specified - PPO | Source: Ambulatory Visit | Attending: Hematology | Admitting: Hematology

## 2015-10-04 VITALS — BP 130/71 | HR 109 | Temp 98.5°F | Resp 18 | Ht 68.0 in | Wt 133.3 lb

## 2015-10-04 DIAGNOSIS — R740 Nonspecific elevation of levels of transaminase and lactic acid dehydrogenase [LDH]: Secondary | ICD-10-CM | POA: Diagnosis not present

## 2015-10-04 DIAGNOSIS — Z0000 Encounter for general adult medical examination without abnormal findings: Secondary | ICD-10-CM | POA: Diagnosis not present

## 2015-10-04 DIAGNOSIS — E876 Hypokalemia: Secondary | ICD-10-CM | POA: Diagnosis not present

## 2015-10-04 DIAGNOSIS — C20 Malignant neoplasm of rectum: Secondary | ICD-10-CM

## 2015-10-04 DIAGNOSIS — R7401 Elevation of levels of liver transaminase levels: Secondary | ICD-10-CM

## 2015-10-04 DIAGNOSIS — Z510 Encounter for antineoplastic radiation therapy: Secondary | ICD-10-CM | POA: Diagnosis not present

## 2015-10-04 LAB — CULTURE, BLOOD (SINGLE)

## 2015-10-04 LAB — LIPASE, BLOOD: Lipase: 35 U/L (ref 11–51)

## 2015-10-04 LAB — CBC WITH DIFFERENTIAL/PLATELET
BASO%: 0.3 % (ref 0.0–2.0)
BASOS ABS: 0 10*3/uL (ref 0.0–0.1)
EOS ABS: 0.2 10*3/uL (ref 0.0–0.5)
EOS%: 3.1 % (ref 0.0–7.0)
HCT: 38.3 % (ref 34.8–46.6)
HEMOGLOBIN: 13 g/dL (ref 11.6–15.9)
LYMPH%: 11.1 % — AB (ref 14.0–49.7)
MCH: 31 pg (ref 25.1–34.0)
MCHC: 33.9 g/dL (ref 31.5–36.0)
MCV: 91.2 fL (ref 79.5–101.0)
MONO#: 0.6 10*3/uL (ref 0.1–0.9)
MONO%: 10.4 % (ref 0.0–14.0)
NEUT#: 4.3 10*3/uL (ref 1.5–6.5)
NEUT%: 75.1 % (ref 38.4–76.8)
PLATELETS: 227 10*3/uL (ref 145–400)
RBC: 4.2 10*6/uL (ref 3.70–5.45)
RDW: 17.3 % — ABNORMAL HIGH (ref 11.2–14.5)
WBC: 5.8 10*3/uL (ref 3.9–10.3)
lymph#: 0.6 10*3/uL — ABNORMAL LOW (ref 0.9–3.3)

## 2015-10-04 LAB — COMPREHENSIVE METABOLIC PANEL
ALT: 317 U/L — AB (ref 0–55)
ANION GAP: 9 meq/L (ref 3–11)
AST: 124 U/L — ABNORMAL HIGH (ref 5–34)
Albumin: 3.1 g/dL — ABNORMAL LOW (ref 3.5–5.0)
Alkaline Phosphatase: 196 U/L — ABNORMAL HIGH (ref 40–150)
BILIRUBIN TOTAL: 0.89 mg/dL (ref 0.20–1.20)
BUN: 8.9 mg/dL (ref 7.0–26.0)
CALCIUM: 8.4 mg/dL (ref 8.4–10.4)
CO2: 26 mEq/L (ref 22–29)
CREATININE: 0.7 mg/dL (ref 0.6–1.1)
Chloride: 104 mEq/L (ref 98–109)
EGFR: >90 mL/min/{1.73_m2} (ref >90)
Glucose: 123 mg/dl (ref 70–140)
Potassium: 3 mEq/L — CL (ref 3.5–5.1)
Sodium: 139 mEq/L (ref 136–145)
TOTAL PROTEIN: 6 g/dL — AB (ref 6.4–8.3)

## 2015-10-04 LAB — AMYLASE: AMYLASE: 27 U/L — AB (ref 28–100)

## 2015-10-04 MED ORDER — POTASSIUM CHLORIDE CRYS ER 20 MEQ PO TBCR
20.0000 meq | EXTENDED_RELEASE_TABLET | Freq: Two times a day (BID) | ORAL | Status: DC
Start: 2015-10-04 — End: 2015-10-05

## 2015-10-04 NOTE — Progress Notes (Signed)
[place]  [phone] [phone]  Clinic follow up Note   Patient Care Team: Arvella Nigh, MD as PCP - General (Obstetrics and Gynecology) [name], MD as Consulting Physician (General Surgery) Kyung Rudd, MD as Consulting Physician (Radiation Oncology) Mauri Pole, MD as Consulting Physician (Gastroenterology)   CHIEF COMPLAINTS:  Follow up rectal cancer   Oncology History   Rectal adenocarcinoma [facility])   Staging form: Colon and Rectum, AJCC 7th Edition     Clinical stage from 08/04/2015: Stage IIA (T3, N0, M0) - Signed by Truitt Merle, MD on 08/31/2015       Rectal adenocarcinoma ([place])   08/04/2015 Initial Biopsy rectal mass adenocarcinoma, descending colon polyp hyperplastic polyp.    08/04/2015 Procedure 5 cm rectal mass starting at 10 cm from anal verge extending to 15 cm, 50mm sigmoid polyp was removed    08/06/2015 Imaging CT chest, abdomen and pelvis w contrast showed a possible area of rectal wall thickening aterior to the distal sacrum, no adenopathy or distant metastasis    08/13/2015 Initial Diagnosis Rectal adenocarcinoma ([place])   08/19/2015 Procedure low EUS showed a T3N0 rectal mass    08/31/2015 -  Radiation Therapy radiation to rectal adenocarcinoma    08/31/2015 -  Chemotherapy Capecitabine 1500mg  q12h with concurrent radiation     HISTORY OF PRESENTING ILLNESS (08/13/2015):  Molly Vang 60 y.o. female is here because of newly diagnosed rectal adenocarcinoma. She is accompanied by her husband to our multidisciplinary GI clinic today.  She had a colonoscopy in 05/2013 by Dr. Olevia Perches which showed a 3-36mm sessile rectal polyp, which was removed, pathology revealed tubular adenoma. She has had intermittent rectal bleeding since Oct 2016, mild, she had mild constipation, no pain, nause or change of apeptie, no weihgt loss. She called Dr. Nichola Sizer office, who has recently retired, and was seen by Dr. Silverio Decamp. She underwent  colonoscopy on 08/04/2015, which showed a 5 cm rectal mass starting at a 10 cm from an approach extending to 15 cm, and a 3 mm sigmoid polyp which was removed. The rectal mass biopsy showed adenocarcinoma. CT of the chest abdomen and pelvis with contrast was performed on 08/06/2015, which showed no adenopathy or distant metastasis.  She otherwise feels well, no other complains. She is a homemaker, also works as a English as a second language teacher. She eats healthy diet, and walks 5 miles a day, very physically active.   She had a early stage skin melanoma on her right arm, was removed a few years ago. She also has a basal cell skin cancer on her face, will need a resection soon. She otherwise is very healthy. No family history of colon cancer or other malignancy.   CURRENT THERAPY: concurrent radiation and chemo with capecitabine 1500mg  q12h, started on 08/30/2014, capecitabine held since 09/29/2015 due to transaminitis  INTERIM HISTORY [name] returns for follow up. She has been having some intermittent abdominal cramps since last week, it lasts less than a minute, and happens a few times a day, she has loose to formed bowel movement a few times a day, no nausea, bloating, or other symptoms. Her hematochezia has resolved since she started his radiation and chemotherapy. She feels slightly more fatigued since last week, able to tolerate daily routine activities, but has not exercise (walking for miles) every day as she used to do. No fever or chills.  MEDICAL HISTORY:  Past Medical History  Diagnosis Date  . SVT (supraventricular tachycardia) ([place]) 06/11/2013    svt on 10/6//  ablation on 10/15  . Cancer ([place])     melanoma right bicep removed    SURGICAL HISTORY: Past Surgical History  Procedure Laterality Date  . Tonsillectomy  1960  . US echocardiography  11/14/11    trace TR,MR  . Supraventricular tachycardia ablation  06/11/2013  . Ovarian cyst surgery  1993  . Supraventricular tachycardia ablation N/A  06/11/2013    Procedure: SUPRAVENTRICULAR TACHYCARDIA ABLATION;  Surgeon: Evans Lance, MD;  Location: [facility] CATH LAB;  Service: Cardiovascular;  Laterality: N/A;  . Nm myocar perf wall motion  12/14/2010    normal  . Colonoscopy    . Eus N/A 08/19/2015    Procedure: LOWER ENDOSCOPIC ULTRASOUND (EUS);  Surgeon: Milus Banister, MD;  Location: [facility] ENDOSCOPY;  Service: Endoscopy;  Laterality: N/A;    SOCIAL HISTORY: Social History   Social History  . Marital Status: Married    Spouse Name: N/A  . Number of Children: 2   . Years of Education: N/A   Occupational History  . She is a Materials engineer    Social History Main Topics  . Smoking status: Never Smoker   . Smokeless tobacco: Never Used  . Alcohol Use: Yes     Comment: 06/11/2013 "beer or glass of wine maybe once/month"  . Drug Use: No  . Sexual Activity: Yes   Other Topics Concern  . Not on file   Social History Narrative    FAMILY HISTORY: Family History  Problem Relation Age of Onset  . Hypertension Father   . Hyperlipidemia Father   . Hypertension Mother   . Hyperlipidemia Brother   . Colon cancer Neg Hx   . Stomach cancer Neg Hx   . Esophageal cancer Neg Hx   . Rectal cancer Neg Hx     ALLERGIES:  has No Known Allergies.  MEDICATIONS:  Current Outpatient Prescriptions  Medication Sig Dispense Refill  . Calcium Carbonate-Vitamin D (CALCIUM 600 + D PO) Take 1 tablet by mouth daily. Vit. D3 is 400iu    . chlorhexidine (PERIDEX) 0.12 % solution Reported on 09/03/2015  98  . chlorpheniramine (CHLOR-TRIMETON) 4 MG tablet Take 4 mg by mouth 2 (two) times daily as needed for allergies. Reported on 09/03/2015    . fish oil-omega-3 fatty acids 1000 MG capsule Take 1 g by mouth daily. EPA 570mg   And DHA 130mg     . naproxen sodium (ANAPROX) 220 MG tablet Take 220 mg by mouth daily as needed (for pain). Reported on 09/28/2015    . ondansetron (ZOFRAN) 8 MG tablet Take 1 tablet (8 mg total) by mouth every 8 (eight) hours as  needed for nausea. 20 tablet 3  . zolpidem (AMBIEN) 10 MG tablet Take 5 mg by mouth at bedtime as needed for sleep. Reported on 09/28/2015    . aspirin EC 81 MG tablet Take 81 mg by mouth daily. Reported on 10/04/2015    . capecitabine (XELODA) 500 MG tablet Take 3 tablets (1,500 mg total) by mouth 2 (two) times daily after a meal. (Patient not taking: Reported on 10/01/2015) 90 tablet 1  . potassium chloride SA (K-DUR,KLOR-CON) 20 MEQ tablet Take 1 tablet (20 mEq total) by mouth 2 (two) times daily. 20 tablet 0   No current facility-administered medications for this visit.    REVIEW OF SYSTEMS:   Constitutional: Denies fevers, chills or abnormal night sweats Eyes: Denies blurriness of vision, double vision or watery eyes Ears, nose, mouth, throat, and face: Denies mucositis or sore throat  Respiratory: Denies cough, dyspnea or wheezes Cardiovascular: Denies palpitation, chest discomfort or lower extremity swelling Gastrointestinal:  Denies nausea, heartburn or change in bowel habits Skin: Denies abnormal skin rashes Lymphatics: Denies new lymphadenopathy or easy bruising Neurological:Denies numbness, tingling or new weaknesses Behavioral/Psych: Mood is stable, no new changes  All other systems were reviewed with the patient and are negative.  PHYSICAL EXAMINATION: ECOG PERFORMANCE STATUS: 0 - Asymptomatic  Filed Vitals:   10/04/15 1520  BP: 130/71  Pulse: 109  Temp: 98.5 F (36.9 C)  Resp: 18   Filed Weights   10/04/15 1520  Weight: 133 lb 4.8 oz (60.464 kg)    GENERAL:alert, no distress and comfortable SKIN: skin color, texture, turgor are normal, no rashes or significant lesions EYES: normal, conjunctiva are pink and non-injected, sclera clear OROPHARYNX:no exudate, no erythema and lips, buccal mucosa, and tongue normal  NECK: supple, thyroid normal size, non-tender, without nodularity LYMPH:  no palpable lymphadenopathy in the cervical, axillary or inguinal LUNGS: clear to  auscultation and percussion with normal breathing effort HEART: regular rate & rhythm and no murmurs and no lower extremity edema ABDOMEN:abdomen soft, non-tender and normal bowel sounds.  Musculoskeletal:no cyanosis of digits and no clubbing  PSYCH: alert & oriented x 3 with fluent speech NEURO: no focal motor/sensory deficits  LABORATORY DATA:  I have reviewed the data as listed CBC Latest Ref Rng 10/04/2015 09/28/2015 09/21/2015  WBC 3.9 - 10.3 10e3/uL 5.8 3.7(L) 7.5  Hemoglobin 11.6 - 15.9 g/dL 13.0 12.6 12.9  Hematocrit 34.8 - 46.6 % 38.3 36.3 39.0  Platelets 145 - 400 10e3/uL 227 192 295    CMP Latest Ref Rng 10/04/2015 09/28/2015 09/14/2015  Glucose 70 - 140 mg/dl 123 131 101  BUN 7.0 - 26.0 mg/dL 8.9 11.6 19.1  Creatinine 0.6 - 1.1 mg/dL 0.7 0.8 0.8  Sodium 136 - 145 mEq/L 139 135(L) 140  Potassium 3.5 - 5.1 mEq/L 3.0(LL) 3.6 3.9  Chloride 96 - 112 mEq/L - - -  CO2 22 - 29 mEq/L 26 24 29   Calcium 8.4 - 10.4 mg/dL 8.4 8.5 9.3  Total Protein 6.4 - 8.3 g/dL 6.0(L) 6.2(L) 7.0  Total Bilirubin 0.20 - 1.20 mg/dL 0.89 0.86 0.44  Alkaline Phos 40 - 150 U/L 196(H) 99 74  AST 5 - 34 U/L 124(H) 61(H) 25  ALT 0 - 55 U/L 317(HH) 129(H) 37    PATHOLOGY REPORT: Diagnosis 08/04/2015 1. Colon, polyp(s), descending - HYPERPLASTIC POLYP. NO ADENOMATOUS CHANGE OR MALIGNANCY 2. Colon, polyp(s), rectum mass - ADENOCARCINOMA. Microscopic Comment 2. The rectal mass is an invasive colorectal adenocarcinoma.   RADIOGRAPHIC STUDIES: I have personally reviewed the radiological images as listed and agreed with the findings in the report. No results found. COLONOSCOPY 08/04/2015 Dr. Silverio Decamp  COLON FINDINGS:  5 cm rectal mass starting at 10 cm from anal verge extending to 15 cm, friable ulcerated mucosa concerning for malignancy, multiple random biopsies were obtained. 3 mm polyp in sigmoid colon removed by cold biopsy forceps. Diverticulosis. Retroflexed views revealed internal hemorrhoids. The time  to cecum = 0.7 Withdrawal time = 14.0 The scope was withdrawn and the procedure completed.  COMPLICATIONS: There were no immediate complications.  ENDOSCOPIC IMPRESSION: 5 cm rectal mass starting at 10 cm from anal verge extending to 15 cm, friable ulcerated mucosa concerning for malignancy, multiple random biopsies were obtained. 3 mm polyp in sigmoid colon removed by cold biopsy forceps. Diverticulosis RECOMMENDATIONS: 1. Await pathology results 2. CT chest, abdomen and pelvis with contrast Referral  to surgery based on pathology and imaging results Repeat colonoscopy in 6-12 months eSigned: Harl Bowie, MD 08/04/15  EUS by Dr. Ardis Hughs  ENDOSCOPIC IMPRESSION: 08/19/2015 1. Non circumferential, 3cm long uT3N0 (Stage IIa) rectal adenocarcinoma with distal edge located 8cm from the anal verge. The lateral edges of the tumor were labeled with submucosal injection of SPOT following EUS examination. 2. The "asymmetric 3.2 x 2.6 cm soft tissue thickening involving the posterior right wall of the distal rectum at the anal verge (image 120) noted by CT scan was located. It is distinct from the malignant mass described above, was vaguely bordered and I suspect this is gynecologic in orgin.  RECOMMENDATIONS: She will likely benefit from neoadjuvant chemo/XRT. Prefer more information about the soft tissue described in #2 above and will order pelvic ultrasound. Will communicate these results with Dr. Lisbeth Renshaw, [facility].   ASSESSMENT & PLAN:  61 year old Caucasian female, history of superficial skin melanoma and basal cell carcinoma, otherwise healthy and fit, presented with rectal bleeding.   1. Rectal adenocarcinoma, cT3N0M0 -I previously reviewed her colonoscopy finding and the surgical biopsy results in detail with her and her husband. -her EUS showed a T3N0 disease, this was reviewed with her again -She is tolerating neoadjuvant chemoRT with capecitabine very well, and her rectal  bleeding has resolved now  -Her Xeloda has been held since last week due to elevated liver enzymes -Lab results reviewed with her again today, her ALT and AST continue to rise, bili is normal, she is asymptomatic, I think that this is likely related to Xeloda, we'll continue holding Xeloda for the rest of her treatment. She is finishing radiation this Friday. -I we'll also check hepatitis B S antigen and hepatitis C antibody to rule out chronic hepatitis -She knows to avoid Tylenol or any alcohol -We'll follow her liver function weekly for the next 2 weeks  2. Rectal bleeding  -resolved since she started chemoRT    3. Transaminitis -Her liver enzymes were normal before she started chemotherapy and radiation -This is likely related to Xeloda -She is asymptomatic, we'll continue watching her liver function carefully  4. Abdominal cramps -Likely related to radiation -Overall mild, tolerable. We'll continue monitoring.  5. Hypokalemia -I encouraged her to eat potassium enriched food  -Her according potassium chloride 20 mEq 1 tablet daily for 20 tablets -Repeat a CMP weekly for the next 2 weeks  Plan -Continue radiation, she will complete this week  -Continue holding Xeloda -Repeat CMP weekly for the next 2 weeks -I'll see her back 4 weeks with lab, sooner if needed. -She is scheduled to see Dr. Marcello Moores next week.    Pt had multiple questions, and I answered to her satisfaction. She knows to call us if she has concerns before your next appointment.  I spent 25 minutes counseling the patient face to face. The total time spent in the appointment was 30 minutes and more than 50% was on counseling.    Truitt Merle  10/04/2015

## 2015-10-05 ENCOUNTER — Ambulatory Visit: Payer: Federal, State, Local not specified - PPO | Admitting: Hematology

## 2015-10-05 ENCOUNTER — Encounter: Payer: Self-pay | Admitting: Hematology

## 2015-10-05 ENCOUNTER — Telehealth: Payer: Self-pay | Admitting: Hematology

## 2015-10-05 ENCOUNTER — Ambulatory Visit
Admission: RE | Admit: 2015-10-05 | Discharge: 2015-10-05 | Disposition: A | Discharge status: Home / Self Care | Payer: Federal, State, Local not specified - PPO | Source: Ambulatory Visit | Attending: Radiation Oncology | Admitting: Radiation Oncology

## 2015-10-05 ENCOUNTER — Other Ambulatory Visit: Payer: Self-pay | Admitting: Hematology

## 2015-10-05 ENCOUNTER — Other Ambulatory Visit: Payer: Federal, State, Local not specified - PPO

## 2015-10-05 ENCOUNTER — Other Ambulatory Visit: Payer: Self-pay | Admitting: *Deleted

## 2015-10-05 DIAGNOSIS — Z510 Encounter for antineoplastic radiation therapy: Secondary | ICD-10-CM | POA: Diagnosis not present

## 2015-10-05 DIAGNOSIS — E559 Vitamin D deficiency, unspecified: Secondary | ICD-10-CM | POA: Insufficient documentation

## 2015-10-05 DIAGNOSIS — E876 Hypokalemia: Secondary | ICD-10-CM

## 2015-10-05 LAB — HEPATITIS C ANTIBODY

## 2015-10-05 LAB — VITAMIN D 25 HYDROXY (VIT D DEFICIENCY, FRACTURES): VIT D 25 HYDROXY: 23.3 ng/mL — AB (ref 30.0–100.0)

## 2015-10-05 LAB — HEPATITIS B SURFACE ANTIGEN: HBsAg Screen: NEGATIVE

## 2015-10-05 MED ORDER — ERGOCALCIFEROL 1.25 MG (50000 UT) PO CAPS: 50000.0000 [IU] | ORAL_CAPSULE | ORAL | Status: DC

## 2015-10-05 MED ORDER — POTASSIUM CHLORIDE CRYS ER 20 MEQ PO TBCR: 20.0000 meq | EXTENDED_RELEASE_TABLET | Freq: Two times a day (BID) | ORAL | Status: DC

## 2015-10-05 NOTE — Telephone Encounter (Signed)
per pof to sch pt appt-pt refused avs

## 2015-10-05 NOTE — Telephone Encounter (Signed)
-----   Message from Truitt Merle, MD sent at 10/05/2015  9:43 AM EST ----- Janifer Adie, please call her with the lab result. HBV and HCV are all negative. Thanks  Truitt Merle

## 2015-10-05 NOTE — Telephone Encounter (Signed)
Called pt with results of hepatitis B & C.  Informed negative per Dr Burr Medico.  She states that her pharmacy did not receive K+ rx.  Resent to [facility]  Pt also would like to know result of Vit D.  Informed of result & she questioned whether she needed to be on Vit D. & if so would also like it called in.  Message to Dr Burr Medico.

## 2015-10-06 ENCOUNTER — Ambulatory Visit
Admission: RE | Admit: 2015-10-06 | Discharge: 2015-10-06 | Disposition: A | Discharge status: Home / Self Care | Payer: Federal, State, Local not specified - PPO | Source: Ambulatory Visit | Attending: Radiation Oncology | Admitting: Radiation Oncology

## 2015-10-06 DIAGNOSIS — Z510 Encounter for antineoplastic radiation therapy: Secondary | ICD-10-CM | POA: Diagnosis not present

## 2015-10-07 ENCOUNTER — Ambulatory Visit
Admission: RE | Admit: 2015-10-07 | Discharge: 2015-10-07 | Disposition: A | Discharge status: Home / Self Care | Payer: Federal, State, Local not specified - PPO | Source: Ambulatory Visit | Attending: Radiation Oncology | Admitting: Radiation Oncology

## 2015-10-07 DIAGNOSIS — Z510 Encounter for antineoplastic radiation therapy: Secondary | ICD-10-CM | POA: Diagnosis not present

## 2015-10-08 ENCOUNTER — Encounter: Payer: Self-pay | Admitting: Radiation Oncology

## 2015-10-08 ENCOUNTER — Ambulatory Visit
Admission: RE | Admit: 2015-10-08 | Discharge: 2015-10-08 | Disposition: A | Discharge status: Home / Self Care | Payer: Federal, State, Local not specified - PPO | Source: Ambulatory Visit | Attending: Radiation Oncology | Admitting: Radiation Oncology

## 2015-10-08 VITALS — BP 109/69 | HR 96 | Temp 98.5°F | Ht 68.0 in | Wt 132.2 lb

## 2015-10-08 DIAGNOSIS — C20 Malignant neoplasm of rectum: Secondary | ICD-10-CM

## 2015-10-08 DIAGNOSIS — Z510 Encounter for antineoplastic radiation therapy: Secondary | ICD-10-CM | POA: Diagnosis not present

## 2015-10-08 NOTE — Progress Notes (Signed)
Department of Radiation Oncology  Phone:  [phone] Fax:        [phone]  Weekly Treatment Note    Name: Molly Vang Date: 10/08/2015 MRN: [id number] DOB: June 28, 1956   Diagnosis:     ICD-9-CM ICD-10-CM   1. Rectal adenocarcinoma (HCC) 154.1 C20      Current dose: 50.4 Gy  Current fraction: 28   MEDICATIONS: Current Outpatient Prescriptions  Medication Sig Dispense Refill  . Calcium Carbonate-Vitamin D (CALCIUM 600 + D PO) Take 1 tablet by mouth daily. Vit. D3 is 400iu    . chlorhexidine (PERIDEX) 0.12 % solution Reported on 09/03/2015  98  . chlorpheniramine (CHLOR-TRIMETON) 4 MG tablet Take 4 mg by mouth 2 (two) times daily as needed for allergies. Reported on 09/03/2015    . ergocalciferol (VITAMIN D2) 50000 units capsule Take 1 capsule (50,000 Units total) by mouth once a week. 8 capsule 0  . fish oil-omega-3 fatty acids 1000 MG capsule Take 1 g by mouth daily. EPA 570mg   And DHA 130mg     . naproxen sodium (ANAPROX) 220 MG tablet Take 220 mg by mouth daily as needed (for pain). Reported on 09/28/2015    . ondansetron (ZOFRAN) 8 MG tablet Take 1 tablet (8 mg total) by mouth every 8 (eight) hours as needed for nausea. 20 tablet 3  . potassium chloride SA (K-DUR,KLOR-CON) 20 MEQ tablet Take 1 tablet (20 mEq total) by mouth 2 (two) times daily. 20 tablet 0  . zolpidem (AMBIEN) 10 MG tablet Take 5 mg by mouth at bedtime as needed for sleep. Reported on 09/28/2015    . aspirin EC 81 MG tablet Take 81 mg by mouth daily. Reported on 10/08/2015    . capecitabine (XELODA) 500 MG tablet Take 3 tablets (1,500 mg total) by mouth 2 (two) times daily after a meal. (Patient not taking: Reported on 10/01/2015) 90 tablet 1   No current facility-administered medications for this encounter.     ALLERGIES: Review of patient's allergies indicates no known allergies.   LABORATORY DATA:  Lab Results  Component Value Date   WBC 5.8 10/04/2015   HGB 13.0 10/04/2015   HCT 38.3 10/04/2015    MCV 91.2 10/04/2015   PLT 227 10/04/2015   Lab Results  Component Value Date   NA 139 10/04/2015   K 3.0* 10/04/2015   CL 105 06/03/2013   CO2 26 10/04/2015   Lab Results  Component Value Date   ALT 317* 10/04/2015   AST 124* 10/04/2015   ALKPHOS 196* 10/04/2015   BILITOT 0.89 10/04/2015     NARRATIVE: Molly Vang was seen today for weekly treatment management. The chart was checked and the patient's films were reviewed.  Molly Vang has received 28 fractions to her pelvis.  Pain level 5/10 no taking anything for pain control.  Appetite is good.  Energy level is good.  No change in the pelvis area Asked to stop by waiting area desk for one month F/U appointment patient asked to be called for the appointment.. BP 109/69 mmHg  Pulse 96  Temp(Src) 98.5 F (36.9 C) (Oral)  Ht 5\' 8"  (1.727 m)  Wt 132 lb 3.2 oz (59.966 kg)  BMI 20.11 kg/m2  SpO2 99%  . Wt Readings from Last 3 Encounters:  10/08/15 132 lb 3.2 oz (59.966 kg)  10/04/15 133 lb 4.8 oz (60.464 kg)  10/01/15 133 lb 4.8 oz (60.464 kg)    PHYSICAL EXAMINATION: height is 5\' 8"  (1.727 m) and weight is 132  lb 3.2 oz (59.966 kg). Her oral temperature is 98.5 F (36.9 C). Her blood pressure is 109/69 and her pulse is 96. Her oxygen saturation is 99%.        ASSESSMENT: The patient is doing satisfactorily with treatment. No significant skin irritation. The patient has continued to have some stomach cramps. We discussed that this may subside in the next one to 2 weeks after completing radiation treatment.  PLAN: The patient finished her final fraction of radiation treatment today.. The patient will follow-up in one month.

## 2015-10-08 NOTE — Progress Notes (Signed)
Mrs. Asaro has received 28 fractions to her pelvis.  Pain level 5/10 no taking anything for pain control.  Appetite is good.  Energy level is good.  No change in the pelvis area Asked to stop by waiting area desk for one month F/U appointment patient asked to be called for the appointment.. BP 109/69 mmHg  Pulse 96  Temp(Src) 98.5 F (36.9 C) (Oral)  Ht 5\' 8"  (1.727 m)  Wt 132 lb 3.2 oz (59.966 kg)  BMI 20.11 kg/m2  SpO2 99%  . Wt Readings from Last 3 Encounters:  10/08/15 132 lb 3.2 oz (59.966 kg)  10/04/15 133 lb 4.8 oz (60.464 kg)  10/01/15 133 lb 4.8 oz (60.464 kg)

## 2015-10-11 ENCOUNTER — Telehealth: Payer: Self-pay | Admitting: *Deleted

## 2015-10-11 ENCOUNTER — Other Ambulatory Visit: Payer: Self-pay | Admitting: General Surgery

## 2015-10-11 NOTE — Telephone Encounter (Signed)
CALLED PATIENT TO INFORM OF FU WITH DR. MOODY ON 11-05-15 @ 4 PM, LVM FOR A RETURN CALL

## 2015-10-11 NOTE — H&P (Signed)
History of Present Illness Molly Ruff MD; Q000111Q 12:23 PM) The patient is a 60 year old female who presents with colorectal cancer. 60yo F with newly diagnosed rectal cancer. She is s/p a colonoscopy in 2014 which showed a polyp with HGD. She developed some rectal bleeding with BM's over the past few months. It was felt to be hemorrhoidal but a colonoscopy was scheduled due to the findings on path from last colonoscopy. The colonoscopy revealed a rectal mass at 10cm, extending to 15 cm. It was not obstructive. Biopsies show andenocarcinoma. CT scans of the chest, abd and pelvis show no signs of metastatic disease. Ultrasound showed a T3 N0 mass approximately 3 cm long with the distal edge located approximately 8 cm from the anal verge. The lateral edges were injected with spot. CEA was 1.3. The ultrasound did reveal some extraluminal masses. A follow-up vaginal ultrasound showed only fibroid disease.   Problem List/Past Medical Molly Ruff, MD; Q000111Q 12:23 PM) Rectal Cancer RECTAL CANCER (C20)  Other Problems Molly Ruff, MD; Q000111Q 12:23 PM) Melanoma Anxiety Disorder Diverticulosis Hemorrhoids  Past Surgical History Molly Ruff, MD; Q000111Q 12:23 PM) SVT Ablation Colon Polyp Removal - Colonoscopy Oral Surgery Tonsillectomy  Diagnostic Studies History Molly Ruff, MD; Q000111Q 12:23 PM) Colonoscopy 1-5 years ago Mammogram within last year Pap Smear 1-5 years ago  Allergies Elbert Ewings, CMA; 10/11/2015 9:25 AM) No Known Drug Allergies 08/13/2015  Medication History Molly Ruff, MD; Q000111Q 12:23 PM) Calcium-Vitamin D (250-125MG -UNIT Tablet, Oral) Active. Ambien (5MG  Tablet, Oral) Active. Ondansetron HCl (8MG  Tablet, Oral as needed) Active. Naproxen Sodium (220MG  Capsule, Oral) Active. Chlorpheniramine Maleate (4MG  Tablet, Oral) Active. Multi Vitamin (Oral) Active. Medications Reconciled Neomycin Sulfate (500MG   Tablet, 2 (two) Tablet Oral SEE NOTE, Taken starting 10/11/2015) Active. (TAKE TWO TABLETS AT 2 PM, 3 PM, AND 10 PM THE DAY PRIOR TO SURGERY) Flagyl (500MG  Tablet, 2 (two) Tablet Oral SEE NOTE, Taken starting 10/11/2015) Active. (Take at 2pm, 3pm, and 10pm the day prior to your colon operation) Aspirin Child (81MG  Tablet Chewable, Oral) Active.  Social History Molly Ruff, MD; Q000111Q 12:23 PM) Tobacco use Never smoker. No drug use Alcohol use Occasional alcohol use. Caffeine use Carbonated beverages, Coffee, Tea.  Family History Molly Ruff, MD; Q000111Q 12:23 PM) Alcohol Abuse Family Members In General, Father, Mother. Colon Polyps Brother, Father, Mother, Sister. Hypertension Family Members In General, Father, Mother. Ischemic Bowel Disease Brother, Daughter.  Pregnancy / Birth History Molly Ruff, MD; Q000111Q 12:23 PM) Age at menarche 67 years. Age of menopause 60-55 Para 2 Gravida 2 Irregular periods Maternal age 64-30     Review of Systems Molly Ruff MD; Q000111Q 12:24 PM) General Not Present- Appetite Loss, Chills, Fatigue, Fever, Night Sweats, Weight Gain and Weight Loss. Skin Present- Change in Wart/Mole. Not Present- Dryness, Hives, Jaundice, New Lesions, Non-Healing Wounds, Rash and Ulcer. HEENT Present- Wears glasses/contact lenses. Not Present- Earache, Hearing Loss, Hoarseness, Nose Bleed, Oral Ulcers, Ringing in the Ears, Seasonal Allergies, Sinus Pain, Sore Throat, Visual Disturbances and Yellow Eyes. Breast Not Present- Breast Mass, Breast Pain, Nipple Discharge and Skin Changes. Cardiovascular Not Present- Chest Pain, Difficulty Breathing Lying Down, Leg Cramps, Palpitations, Rapid Heart Rate, Shortness of Breath and Swelling of Extremities. Gastrointestinal Present- Change in Bowel Habits. Not Present- Abdominal Pain, Bloating, Bloody Stool, Chronic diarrhea, Constipation, Difficulty Swallowing, Excessive gas, Gets full quickly at  meals, Hemorrhoids, Indigestion, Nausea, Rectal Pain and Vomiting. Female Genitourinary Not Present- Frequency, Nocturia, Painful Urination, Pelvic Pain and Urgency. Musculoskeletal Not Present- Back  Pain, Joint Pain, Joint Stiffness, Muscle Pain, Muscle Weakness and Swelling of Extremities. Neurological Not Present- Decreased Memory, Fainting, Headaches, Numbness, Seizures, Tingling, Tremor, Trouble walking and Weakness. Psychiatric Present- Anxiety. Not Present- Bipolar, Change in Sleep Pattern, Depression, Fearful and Frequent crying. Hematology Not Present- Easy Bruising, Excessive bleeding, Gland problems, HIV and Persistent Infections.  Vitals Elbert Ewings CMA; 10/11/2015 9:27 AM) 10/11/2015 9:27 AM Weight: 129 lb Height: 67.5in Body Surface Area: 1.69 m Body Mass Index: 19.91 kg/m  Temp.: 97.45F  Pulse: 95 (Regular)  BP: 130/74 (Sitting, Left Arm, Standard)      Physical Exam Molly Ruff MD; Q000111Q 12:24 PM)  General Mental Status-Alert. General Appearance-Not in acute distress. Build & Nutrition-Well nourished. Posture-Normal posture. Gait-Normal.  Head and Neck Head-normocephalic, atraumatic with no lesions or palpable masses. Trachea-midline.  Chest and Lung Exam Chest and lung exam reveals -on auscultation, normal breath sounds, no adventitious sounds and normal vocal resonance.  Cardiovascular Cardiovascular examination reveals -normal heart sounds, regular rate and rhythm with no murmurs.  Abdomen Inspection Inspection of the abdomen reveals - No Hernias. Palpation/Percussion Palpation and Percussion of the abdomen reveal - Soft, Non Tender, No Rigidity (guarding), No hepatosplenomegaly and No Palpable abdominal masses.  Neurologic Neurologic evaluation reveals -alert and oriented x 3 with no impairment of recent or remote memory, normal attention span and ability to concentrate, normal sensation and normal  coordination.  Musculoskeletal Normal Exam - Bilateral-Upper Extremity Strength Normal and Lower Extremity Strength Normal.    Assessment & Plan Molly Ruff MD; Q000111Q 9:51 AM)  RECTAL CANCER (C20) Impression: 60 year old female who presents to the office with rectal cancer status post neoadjuvant chemotherapy and radiation. She is now ready to schedule surgery. We have discussed this in detail. The surgery and anatomy were described to the patient as well as the risks of surgery and the possible complications. These include: Bleeding, deep abdominal infections and possible wound complications such as hernia and infection, damage to adjacent structures, leak of surgical connections, which can lead to other surgeries and possibly an ostomy, possible need for other procedures, such as abscess drains in radiology, possible prolonged hospital stay, possible diarrhea from removal of part of the colon, possible constipation from narcotics, possible bowel, bladder or sexual dysfunction if having rectal surgery, prolonged fatigue/weakness or appetite loss, possible early recurrence of of disease, possible complications of their medical problems such as heart disease or arrhythmias or lung problems, death (less than 1%). I believe the patient understands and wishes to proceed with the surgery.

## 2015-10-12 ENCOUNTER — Other Ambulatory Visit (HOSPITAL_BASED_OUTPATIENT_CLINIC_OR_DEPARTMENT_OTHER): Payer: Federal, State, Local not specified - PPO

## 2015-10-12 ENCOUNTER — Other Ambulatory Visit: Payer: Self-pay | Admitting: *Deleted

## 2015-10-12 ENCOUNTER — Other Ambulatory Visit: Payer: Federal, State, Local not specified - PPO

## 2015-10-12 ENCOUNTER — Telehealth: Payer: Self-pay

## 2015-10-12 DIAGNOSIS — C20 Malignant neoplasm of rectum: Secondary | ICD-10-CM

## 2015-10-12 LAB — CBC WITH DIFFERENTIAL/PLATELET
BASO%: 0.6 % (ref 0.0–2.0)
Basophils Absolute: 0.1 10*3/uL (ref 0.0–0.1)
EOS ABS: 0.1 10*3/uL (ref 0.0–0.5)
EOS%: 1.4 % (ref 0.0–7.0)
HCT: 40.1 % (ref 34.8–46.6)
HGB: 13.3 g/dL (ref 11.6–15.9)
LYMPH%: 3 % — AB (ref 14.0–49.7)
MCH: 30.8 pg (ref 25.1–34.0)
MCHC: 33.2 g/dL (ref 31.5–36.0)
MCV: 92.6 fL (ref 79.5–101.0)
MONO#: 1 10*3/uL — ABNORMAL HIGH (ref 0.1–0.9)
MONO%: 9.9 % (ref 0.0–14.0)
NEUT%: 85.1 % — ABNORMAL HIGH (ref 38.4–76.8)
NEUTROS ABS: 8.3 10*3/uL — AB (ref 1.5–6.5)
PLATELETS: 385 10*3/uL (ref 145–400)
RBC: 4.33 10*6/uL (ref 3.70–5.45)
RDW: 19 % — AB (ref 11.2–14.5)
WBC: 9.7 10*3/uL (ref 3.9–10.3)
lymph#: 0.3 10*3/uL — ABNORMAL LOW (ref 0.9–3.3)

## 2015-10-12 LAB — COMPREHENSIVE METABOLIC PANEL WITH GFR
ALT: 287 U/L (ref 0–55)
AST: 157 U/L — ABNORMAL HIGH (ref 5–34)
Albumin: 3.1 g/dL — ABNORMAL LOW (ref 3.5–5.0)
Alkaline Phosphatase: 219 U/L — ABNORMAL HIGH (ref 40–150)
Anion Gap: 9 meq/L (ref 3–11)
BUN: 11.9 mg/dL (ref 7.0–26.0)
CO2: 27 meq/L (ref 22–29)
Calcium: 8.7 mg/dL (ref 8.4–10.4)
Chloride: 103 meq/L (ref 98–109)
Creatinine: 0.8 mg/dL (ref 0.6–1.1)
EGFR: 86 ml/min/1.73 m2 — ABNORMAL LOW (ref >90)
Glucose: 110 mg/dL (ref 70–140)
Potassium: 3.6 meq/L (ref 3.5–5.1)
Sodium: 138 meq/L (ref 136–145)
Total Bilirubin: 0.64 mg/dL (ref 0.20–1.20)
Total Protein: 6.1 g/dL — ABNORMAL LOW (ref 6.4–8.3)

## 2015-10-12 NOTE — Telephone Encounter (Signed)
Pt called asking Dr Burr Medico for interpretation of her labs from today. She picked up a copy of the results.

## 2015-10-12 NOTE — Telephone Encounter (Signed)
I called her back and reviewed her lab results with her. Her LFTs are slowly recovering, we will check it again on her next visit on March signs. She feels well overall, her epigastric pain has improved lately. No other new complaints. She knows to call us if she has new concerns.  Truitt Merle  10/12/2015

## 2015-10-13 ENCOUNTER — Telehealth: Payer: Self-pay | Admitting: *Deleted

## 2015-10-13 NOTE — Telephone Encounter (Signed)
Oncology Nurse Navigator Documentation  Oncology Nurse Navigator Flowsheets 10/13/2015  Navigator Location [facility]  Navigator Encounter Type Telephone  Telephone Outgoing Call;Patient Update  Patient Visit Type -  Treatment Phase -  Barriers/Navigation Needs -No navigation needs or questions  Education -  Interventions -  Coordination of Care -  Acuity -  Time Spent with Patient 5  Called to f/u on status. Confirmed she saw Dr. Marcello Moores on 2/13 and is planning on her surgery in March. Informed her navigator will follow her and visit in hospital when she has her surgery. She says "that won't be necessary. I still don't know what you do." Acknowledged her opinion and wish to not be visited. Will be available to her on prn basis at this point.

## 2015-10-26 ENCOUNTER — Other Ambulatory Visit: Payer: Federal, State, Local not specified - PPO

## 2015-10-26 ENCOUNTER — Ambulatory Visit: Payer: Federal, State, Local not specified - PPO | Admitting: Hematology

## 2015-11-01 ENCOUNTER — Other Ambulatory Visit (HOSPITAL_BASED_OUTPATIENT_CLINIC_OR_DEPARTMENT_OTHER): Payer: Federal, State, Local not specified - PPO

## 2015-11-01 ENCOUNTER — Telehealth: Payer: Self-pay | Admitting: Hematology

## 2015-11-01 ENCOUNTER — Encounter: Payer: Self-pay | Admitting: Hematology

## 2015-11-01 ENCOUNTER — Other Ambulatory Visit: Payer: Self-pay | Admitting: Hematology

## 2015-11-01 ENCOUNTER — Ambulatory Visit (HOSPITAL_BASED_OUTPATIENT_CLINIC_OR_DEPARTMENT_OTHER): Payer: Federal, State, Local not specified - PPO | Admitting: Hematology

## 2015-11-01 VITALS — BP 134/78 | HR 82 | Temp 98.2°F | Resp 16 | Ht 68.0 in | Wt 130.8 lb

## 2015-11-01 DIAGNOSIS — R740 Nonspecific elevation of levels of transaminase and lactic acid dehydrogenase [LDH]: Secondary | ICD-10-CM

## 2015-11-01 DIAGNOSIS — C20 Malignant neoplasm of rectum: Secondary | ICD-10-CM

## 2015-11-01 LAB — COMPREHENSIVE METABOLIC PANEL
ALBUMIN: 3.5 g/dL (ref 3.5–5.0)
ALK PHOS: 123 U/L (ref 40–150)
ALT: 62 U/L — AB (ref 0–55)
AST: 42 U/L — AB (ref 5–34)
Anion Gap: 9 mEq/L (ref 3–11)
BILIRUBIN TOTAL: 0.35 mg/dL (ref 0.20–1.20)
BUN: 15.3 mg/dL (ref 7.0–26.0)
CO2: 27 meq/L (ref 22–29)
CREATININE: 0.8 mg/dL (ref 0.6–1.1)
Calcium: 8.7 mg/dL (ref 8.4–10.4)
Chloride: 105 mEq/L (ref 98–109)
EGFR: 82 mL/min/{1.73_m2} — ABNORMAL LOW (ref >90)
GLUCOSE: 96 mg/dL (ref 70–140)
Potassium: 3.7 mEq/L (ref 3.5–5.1)
SODIUM: 141 meq/L (ref 136–145)
TOTAL PROTEIN: 6.7 g/dL (ref 6.4–8.3)

## 2015-11-01 LAB — CBC WITH DIFFERENTIAL/PLATELET
BASO%: 1.2 % (ref 0.0–2.0)
Basophils Absolute: 0.1 10*3/uL (ref 0.0–0.1)
EOS ABS: 0.3 10*3/uL (ref 0.0–0.5)
EOS%: 3.3 % (ref 0.0–7.0)
HCT: 39.5 % (ref 34.8–46.6)
HEMOGLOBIN: 12.9 g/dL (ref 11.6–15.9)
LYMPH%: 9.7 % — ABNORMAL LOW (ref 14.0–49.7)
MCH: 30.4 pg (ref 25.1–34.0)
MCHC: 32.6 g/dL (ref 31.5–36.0)
MCV: 93.3 fL (ref 79.5–101.0)
MONO#: 0.7 10*3/uL (ref 0.1–0.9)
MONO%: 8.9 % (ref 0.0–14.0)
NEUT%: 76.9 % — ABNORMAL HIGH (ref 38.4–76.8)
NEUTROS ABS: 6.1 10*3/uL (ref 1.5–6.5)
Platelets: 399 10*3/uL (ref 145–400)
RBC: 4.23 10*6/uL (ref 3.70–5.45)
RDW: 18.2 % — AB (ref 11.2–14.5)
WBC: 7.9 10*3/uL (ref 3.9–10.3)
lymph#: 0.8 10*3/uL — ABNORMAL LOW (ref 0.9–3.3)

## 2015-11-01 NOTE — Telephone Encounter (Signed)
Pt aware of appts...the patient ok and aware of all appts...did not want print out

## 2015-11-01 NOTE — Progress Notes (Signed)
[place]  Telephone:(336) [phone] Fax:(336) [phone]  Clinic follow up Note   Patient Care Team: Arvella Nigh, MD as PCP - General (Obstetrics and Gynecology) [name], MD as Consulting Physician (General Surgery) Kyung Rudd, MD as Consulting Physician (Radiation Oncology) Mauri Pole, MD as Consulting Physician (Gastroenterology)   CHIEF COMPLAINTS:  Follow up rectal cancer   Oncology History   Rectal adenocarcinoma [facility])   Staging form: Colon and Rectum, AJCC 7th Edition     Clinical stage from 08/04/2015: Stage IIA (T3, N0, M0) - Signed by Truitt Merle, MD on 08/31/2015       Rectal adenocarcinoma ([place])   08/04/2015 Initial Biopsy rectal mass adenocarcinoma, descending colon polyp hyperplastic polyp.    08/04/2015 Procedure 5 cm rectal mass starting at 10 cm from anal verge extending to 15 cm, 29mm sigmoid polyp was removed    08/06/2015 Imaging CT chest, abdomen and pelvis w contrast showed a possible area of rectal wall thickening aterior to the distal sacrum, no adenopathy or distant metastasis    08/13/2015 Initial Diagnosis Rectal adenocarcinoma ([place])   08/19/2015 Procedure low EUS showed a T3N0 rectal mass    08/31/2015 -  Radiation Therapy radiation to rectal adenocarcinoma    08/31/2015 -  Chemotherapy Capecitabine 1500mg  q12h with concurrent radiation     HISTORY OF PRESENTING ILLNESS (08/13/2015):  Molly Vang 60 y.o. female is here because of newly diagnosed rectal adenocarcinoma. She is accompanied by her husband to our multidisciplinary GI clinic today.  She had a colonoscopy in 05/2013 by Dr. Olevia Perches which showed a 3-40mm sessile rectal polyp, which was removed, pathology revealed tubular adenoma. She has had intermittent rectal bleeding since Oct 2016, mild, she had mild constipation, no pain, nause or change of apeptie, no weihgt loss. She called Dr. Nichola Sizer office, who has recently retired, and was seen by Dr. Silverio Decamp. She underwent  colonoscopy on 08/04/2015, which showed a 5 cm rectal mass starting at a 10 cm from an approach extending to 15 cm, and a 3 mm sigmoid polyp which was removed. The rectal mass biopsy showed adenocarcinoma. CT of the chest abdomen and pelvis with contrast was performed on 08/06/2015, which showed no adenopathy or distant metastasis.  She otherwise feels well, no other complains. She is a homemaker, also works as a English as a second language teacher. She eats healthy diet, and walks 5 miles a day, very physically active.   She had a early stage skin melanoma on her right arm, was removed a few years ago. She also has a basal cell skin cancer on her face, will need a resection soon. She otherwise is very healthy. No family history of colon cancer or other malignancy.   CURRENT THERAPY: pending surgery   INTERIM HISTORY [name] returns for follow up. She completed radiation on therapy 06/16/2016. She has recovered very well from radiation and chemotherapy. Her abdominal cramps has resolved, she has better appetite and energy level. She is back to regular exercise and routine activities without any difficulties. She has good appetite, and has gained some weight back. She is scheduled for surgery on 11/26/2015.  MEDICAL HISTORY:  Past Medical History  Diagnosis Date  . SVT (supraventricular tachycardia) ([place]) 06/11/2013    svt on 10/6// ablation on 10/15  . Cancer ([place])     melanoma right bicep removed    SURGICAL HISTORY: Past Surgical History  Procedure Laterality Date  . Tonsillectomy  1960  . US echocardiography  11/14/11    trace TR,MR  .  Supraventricular tachycardia ablation  06/11/2013  . Ovarian cyst surgery  1993  . Supraventricular tachycardia ablation N/A 06/11/2013    Procedure: SUPRAVENTRICULAR TACHYCARDIA ABLATION;  Surgeon: Evans Lance, MD;  Location: [facility] CATH LAB;  Service: Cardiovascular;  Laterality: N/A;  . Nm myocar perf wall motion  12/14/2010    normal  . Colonoscopy    . Eus N/A  08/19/2015    Procedure: LOWER ENDOSCOPIC ULTRASOUND (EUS);  Surgeon: Milus Banister, MD;  Location: [facility];  Service: Endoscopy;  Laterality: N/A;    SOCIAL HISTORY: Social History   Social History  . Marital Status: Married    Spouse Name: N/A  . Number of Children: 2   . Years of Education: N/A   Occupational History  . She is a Materials engineer    Social History Main Topics  . Smoking status: Never Smoker   . Smokeless tobacco: Never Used  . Alcohol Use: Yes     Comment: 06/11/2013 "beer or glass of wine maybe once/month"  . Drug Use: No  . Sexual Activity: Yes   Other Topics Concern  . Not on file   Social History Narrative    FAMILY HISTORY: Family History  Problem Relation Age of Onset  . Hypertension Father   . Hyperlipidemia Father   . Hypertension Mother   . Hyperlipidemia Brother   . Colon cancer Neg Hx   . Stomach cancer Neg Hx   . Esophageal cancer Neg Hx   . Rectal cancer Neg Hx     ALLERGIES:  has No Known Allergies.  MEDICATIONS:  Current Outpatient Prescriptions  Medication Sig Dispense Refill  . aspirin EC 81 MG tablet Take 81 mg by mouth daily. Reported on 10/08/2015    . Calcium Carbonate-Vitamin D (CALCIUM 600 + D PO) Take 1 tablet by mouth daily. Vit. D3 is 400iu    . chlorhexidine (PERIDEX) 0.12 % solution Reported on 09/03/2015  98  . chlorpheniramine (CHLOR-TRIMETON) 4 MG tablet Take 4 mg by mouth 2 (two) times daily as needed for allergies. Reported on 09/03/2015    . ergocalciferol (VITAMIN D2) 50000 units capsule Take 1 capsule (50,000 Units total) by mouth once a week. 8 capsule 0  . fish oil-omega-3 fatty acids 1000 MG capsule Take 1 g by mouth daily. EPA 570mg   And DHA 130mg     . naproxen sodium (ANAPROX) 220 MG tablet Take 220 mg by mouth daily as needed (for pain). Reported on 09/28/2015    . potassium chloride SA (K-DUR,KLOR-CON) 20 MEQ tablet Take 1 tablet (20 mEq total) by mouth 2 (two) times daily. 20 tablet 0  . zolpidem  (AMBIEN) 10 MG tablet Take 5 mg by mouth at bedtime as needed for sleep. Reported on 09/28/2015    . capecitabine (XELODA) 500 MG tablet Take 3 tablets (1,500 mg total) by mouth 2 (two) times daily after a meal. (Patient not taking: Reported on 10/01/2015) 90 tablet 1  . ondansetron (ZOFRAN) 8 MG tablet Take 1 tablet (8 mg total) by mouth every 8 (eight) hours as needed for nausea. (Patient not taking: Reported on 11/01/2015) 20 tablet 3   No current facility-administered medications for this visit.    REVIEW OF SYSTEMS:   Constitutional: Denies fevers, chills or abnormal night sweats Eyes: Denies blurriness of vision, double vision or watery eyes Ears, nose, mouth, throat, and face: Denies mucositis or sore throat Respiratory: Denies cough, dyspnea or wheezes Cardiovascular: Denies palpitation, chest discomfort or lower extremity swelling Gastrointestinal:  Denies nausea, heartburn or change in bowel habits Skin: Denies abnormal skin rashes Lymphatics: Denies new lymphadenopathy or easy bruising Neurological:Denies numbness, tingling or new weaknesses Behavioral/Psych: Mood is stable, no new changes  All other systems were reviewed with the patient and are negative.  PHYSICAL EXAMINATION: ECOG PERFORMANCE STATUS: 0 - Asymptomatic  Filed Vitals:   11/01/15 1545  BP: 134/78  Pulse: 82  Temp: 98.2 F (36.8 C)  Resp: 16   Filed Weights   11/01/15 1545  Weight: 130 lb 12.8 oz (59.33 kg)    GENERAL:alert, no distress and comfortable SKIN: skin color, texture, turgor are normal, no rashes or significant lesions EYES: normal, conjunctiva are pink and non-injected, sclera clear OROPHARYNX:no exudate, no erythema and lips, buccal mucosa, and tongue normal  NECK: supple, thyroid normal size, non-tender, without nodularity LYMPH:  no palpable lymphadenopathy in the cervical, axillary or inguinal LUNGS: clear to auscultation and percussion with normal breathing effort HEART: regular rate &  rhythm and no murmurs and no lower extremity edema ABDOMEN:abdomen soft, non-tender and normal bowel sounds.  Musculoskeletal:no cyanosis of digits and no clubbing  PSYCH: alert & oriented x 3 with fluent speech NEURO: no focal motor/sensory deficits  LABORATORY DATA:  I have reviewed the data as listed CBC Latest Ref Rng 11/01/2015 10/12/2015 10/04/2015  WBC 3.9 - 10.3 10e3/uL 7.9 9.7 5.8  Hemoglobin 11.6 - 15.9 g/dL 12.9 13.3 13.0  Hematocrit 34.8 - 46.6 % 39.5 40.1 38.3  Platelets 145 - 400 10e3/uL 399 385 227    CMP Latest Ref Rng 11/01/2015 10/12/2015 10/04/2015  Glucose 70 - 140 mg/dl 96 110 123  BUN 7.0 - 26.0 mg/dL 15.3 11.9 8.9  Creatinine 0.6 - 1.1 mg/dL 0.8 0.8 0.7  Sodium 136 - 145 mEq/L 141 138 139  Potassium 3.5 - 5.1 mEq/L 3.7 3.6 3.0(LL)  CO2 22 - 29 mEq/L 27 27 26   Calcium 8.4 - 10.4 mg/dL 8.7 8.7 8.4  Total Protein 6.4 - 8.3 g/dL 6.7 6.1(L) 6.0(L)  Total Bilirubin 0.20 - 1.20 mg/dL 0.35 0.64 0.89  Alkaline Phos 40 - 150 U/L 123 219(H) 196(H)  AST 5 - 34 U/L 42(H) 157(H) 124(H)  ALT 0 - 55 U/L 62(H) 287(HH) 317(HH)    PATHOLOGY REPORT: Diagnosis 08/04/2015 1. Colon, polyp(s), descending - HYPERPLASTIC POLYP. NO ADENOMATOUS CHANGE OR MALIGNANCY 2. Colon, polyp(s), rectum mass - ADENOCARCINOMA. Microscopic Comment 2. The rectal mass is an invasive colorectal adenocarcinoma.   RADIOGRAPHIC STUDIES: I have personally reviewed the radiological images as listed and agreed with the findings in the report. No results found. COLONOSCOPY 08/04/2015 Dr. Silverio Decamp  COLON FINDINGS:  5 cm rectal mass starting at 10 cm from anal verge extending to 15 cm, friable ulcerated mucosa concerning for malignancy, multiple random biopsies were obtained. 3 mm polyp in sigmoid colon removed by cold biopsy forceps. Diverticulosis. Retroflexed views revealed internal hemorrhoids. The time to cecum = 0.7 Withdrawal time = 14.0 The scope was withdrawn and the procedure  completed.  COMPLICATIONS: There were no immediate complications.  ENDOSCOPIC IMPRESSION: 5 cm rectal mass starting at 10 cm from anal verge extending to 15 cm, friable ulcerated mucosa concerning for malignancy, multiple random biopsies were obtained. 3 mm polyp in sigmoid colon removed by cold biopsy forceps. Diverticulosis RECOMMENDATIONS: 1. Await pathology results 2. CT chest, abdomen and pelvis with contrast Referral to surgery based on pathology and imaging results Repeat colonoscopy in 6-12 months eSigned: Harl Bowie, MD 08/04/15  EUS by Dr. Ardis Hughs  ENDOSCOPIC IMPRESSION:  08/19/2015 1. Non circumferential, 3cm long uT3N0 (Stage IIa) rectal adenocarcinoma with distal edge located 8cm from the anal verge. The lateral edges of the tumor were labeled with submucosal injection of SPOT following EUS examination. 2. The "asymmetric 3.2 x 2.6 cm soft tissue thickening involving the posterior right wall of the distal rectum at the anal verge (image 120) noted by CT scan was located. It is distinct from the malignant mass described above, was vaguely bordered and I suspect this is gynecologic in orgin.  RECOMMENDATIONS: She will likely benefit from neoadjuvant chemo/XRT. Prefer more information about the soft tissue described in #2 above and will order pelvic ultrasound. Will communicate these results with Dr. Lisbeth Renshaw, [facility] [place].   ASSESSMENT & PLAN:  60 year old Caucasian female, history of superficial skin melanoma and basal cell carcinoma, otherwise healthy and fit, presented with rectal bleeding.   1. Rectal adenocarcinoma, cT3N0M0 -I previously reviewed her colonoscopy finding and the surgical biopsy results in detail with her and her husband. -her EUS showed a T3N0 disease, this was reviewed with her again -She has completed new adjuvant chemotherapy and radiation, tolerated well overall. Her hematochezia resolved after the first few weeks of radiation. -Her Xeloda  was held for last 7 days of radiation due to elevated liver enzymes -Repeat his lab today shows a much improved transaminitis, CBC normal.  -She is scheduled for surgery by Dr. Marcello Moores on March 31  -We discussed the role of adjuvant chemotherapy. If she has good response to neoadjuvant therapy, no positive lymph nodes on surgical past. I will likely not recommend adjuvant chemotherapy. Patient is not enthusiastic about more chemotherapy also.  -We also reviewed surveillance plan after her surgery.   2. Transaminitis -Her liver enzymes were normal before she started chemotherapy and radiation. Hepatitis B and C were negative.  -This is likely related to Xeloda -Near resolved now, she is clinically doing very well, we'll continue monitoring.   3. Abdominal cramps -Resolved now. She had chemoradiation related colitis    Plan -surgery 11/26/15 -I'll see her back 3 weeks after surgery   Pt had multiple questions, and I answered to her satisfaction. She knows to call us if she has concerns before your next appointment.  I spent 20 minutes counseling the patient face to face. The total time spent in the appointment was 25 minutes and more than 50% was on counseling.    Truitt Merle  11/01/2015

## 2015-11-02 ENCOUNTER — Ambulatory Visit: Payer: Federal, State, Local not specified - PPO | Admitting: Hematology

## 2015-11-02 ENCOUNTER — Other Ambulatory Visit: Payer: Federal, State, Local not specified - PPO

## 2015-11-02 NOTE — Progress Notes (Signed)
  Radiation Oncology         [phone]) [phone] ________________________________  Name: Molly Vang MRN: [id number]  Date: 10/08/2015  DOB: April 20, 1956  End of Treatment Note  Diagnosis:   Rectal cancer    Rectal adenocarcinoma [facility])   Staging form: Colon and Rectum, AJCC 7th Edition     Clinical stage from 08/04/2015: Stage IIA (T3, N0, M0) - Signed by Truitt Merle, MD on 08/31/2015   Indication for treatment:  Curative       Radiation treatment dates:   09/01/2015 through 10/08/2015  Site/dose:    The patient was treated to the pelvis to a dose of 45 Gy at 1.8 Gy per fraction. This was accomplished using a 4 field 3-D conformal technique. The patient then received a boost to the tumor and adjacent high-risk regions for an additional 5.4 Gy at 1.8 gray per fraction. This was carried out using a coned-down 4 field approach. The patient's total dose was 50.4 Gy. Daily AlignRT was used on a daily basis to insure proper patient positioning and localization of critical targets/ structures. The patient received concurrent chemotherapy during the course of radiation treatment.  Narrative: The patient tolerated radiation treatment relatively well.   The patient had some expected skin irritation towards the end of treatment.  Plan: The patient has completed radiation treatment. The patient will return to radiation oncology clinic for routine followup in one month. I advised the patient to call or return sooner if they have any questions or concerns related to their recovery or treatment.   ------------------------------------------------  Jodelle Gross, MD, PhD

## 2015-11-05 ENCOUNTER — Ambulatory Visit
Admission: RE | Admit: 2015-11-05 | Discharge: 2015-11-05 | Disposition: A | Discharge status: Home / Self Care | Payer: Federal, State, Local not specified - PPO | Source: Ambulatory Visit | Attending: Radiation Oncology | Admitting: Radiation Oncology

## 2015-11-05 ENCOUNTER — Encounter: Payer: Self-pay | Admitting: Radiation Oncology

## 2015-11-05 VITALS — BP 110/67 | HR 93 | Temp 98.0°F | Ht 68.0 in | Wt 134.8 lb

## 2015-11-05 DIAGNOSIS — C20 Malignant neoplasm of rectum: Secondary | ICD-10-CM

## 2015-11-05 NOTE — Progress Notes (Signed)
Reported Enteritis x 1 month with awful abdominal cramping and waves of pain like child birth.  Massage reduced pain.  States she has gained 6 lbs since end of treat.No bleeding at this time.  To have surgery on 11/26/15.

## 2015-11-05 NOTE — Progress Notes (Signed)
Radiation Oncology         [phone] ________________________________  Name: Molly Vang MRN: [id number]  Date: 11/05/2015  DOB: [birth year]  Follow-Up Visit Note  CC: Darlyn Chamber, MD  Arvella Nigh, MD  Diagnosis: rectal cancer Rectal adenocarcinoma [facility])   Staging form: Colon and Rectum, AJCC 7th Edition     Clinical stage from 08/04/2015: Stage IIA (T3, N0, M0) - Signed by Truitt Merle, MD on 08/31/2015   Oncology History   Rectal adenocarcinoma [facility])   Staging form: Colon and Rectum, AJCC 7th Edition     Clinical stage from 08/04/2015: Stage IIA (T3, N0, M0) - Signed by Truitt Merle, MD on 08/31/2015       Rectal adenocarcinoma ([place])   08/04/2015 Initial Biopsy rectal mass adenocarcinoma, descending colon polyp hyperplastic polyp.    08/04/2015 Procedure 5 cm rectal mass starting at 10 cm from anal verge extending to 15 cm, 69mm sigmoid polyp was removed    08/06/2015 Imaging CT chest, abdomen and pelvis w contrast showed a possible area of rectal wall thickening aterior to the distal sacrum, no adenopathy or distant metastasis    08/13/2015 Initial Diagnosis Rectal adenocarcinoma ([place])   08/19/2015 Procedure low EUS showed a T3N0 rectal mass    08/31/2015 -  Radiation Therapy radiation to rectal adenocarcinoma    08/31/2015 - 10/08/2015 Chemotherapy Capecitabine 1500mg  q12h with concurrent radiation, held after 09/29/2015 due to transaminitis      Narrative:  The patient returns today for routine follow-up.    Reported Enteritis x 1 mouth with awful abdominal cramping and waves of pain like child birth.  Massage reduced pain.  States she has gained 6 lbs since end of treat.No bleeding at this time.  To have surgery on 11/26/15.    The patient unfortunately is doing much better with her GI issues over the last month. She states that her skin is doing well without any difficulties with this. She did very well in this regard during treatment. No difficulties with changes in urination. Good bowel  movements.                     ALLERGIES:  has No Known Allergies.  Meds: Current Outpatient Prescriptions  Medication Sig Dispense Refill  . aspirin EC 81 MG tablet Take 81 mg by mouth daily. Reported on 10/08/2015    . Calcium Carbonate-Vitamin D (CALCIUM 600 + D PO) Take 1 tablet by mouth daily. Vit. D3 is 400iu    . chlorhexidine (PERIDEX) 0.12 % solution Reported on 09/03/2015  98  . chlorpheniramine (CHLOR-TRIMETON) 4 MG tablet Take 4 mg by mouth 2 (two) times daily as needed for allergies. Reported on 09/03/2015    . ergocalciferol (VITAMIN D2) 50000 units capsule Take 1 capsule (50,000 Units total) by mouth once a week. 8 capsule 0  . fish oil-omega-3 fatty acids 1000 MG capsule Take 1 g by mouth daily. EPA 570mg   And DHA 130mg     . Multiple Vitamins-Minerals (MULTIVITAMIN WOMEN 50+ PO) Take 1 tablet by mouth daily.    . capecitabine (XELODA) 500 MG tablet Take 3 tablets (1,500 mg total) by mouth 2 (two) times daily after a meal. (Patient not taking: Reported on 10/01/2015) 90 tablet 1  . naproxen sodium (ANAPROX) 220 MG tablet Take 220 mg by mouth daily as needed (for pain). Reported on 09/28/2015    . ondansetron (ZOFRAN) 8 MG tablet Take 1 tablet (8 mg total) by mouth every 8 (eight) hours as  needed for nausea. (Patient not taking: Reported on 11/01/2015) 20 tablet 3   No current facility-administered medications for this encounter.    Physical Findings: The patient is in no acute distress. Patient is alert and oriented.  height is 5\' 8"  (1.727 m) and weight is 134 lb 12.8 oz (61.145 kg). Her temperature is 98 F (36.7 C). Her blood pressure is 110/67 and her pulse is 93. .      Lab Findings: Lab Results  Component Value Date   WBC 7.9 11/01/2015   HGB 12.9 11/01/2015   HCT 39.5 11/01/2015   MCV 93.3 11/01/2015   PLT 399 11/01/2015     Radiographic Findings: No results found.  Impression:    The patient is doing satisfactorily approximately one month after completing  preoperative chemoradiotherapy for rectal cancer. Surgery is scheduled on 11/26/2015.   I discussed long-term follow-up with the patient. She wishes to hold off on scheduling a definite follow-up but she knows to contact our office if we can be of any further assistance. We discussed possible fibrosis in the pelvic area including the issue of vaginal narrowing. We discussed the possible use of a vaginal dilator. The patient does not feel that she needs this currently but she will let us know. I'm very pleased overall with how the patient has done. [symptom management/ use of vaginal dilator with nursing instructions]  Plan:  The patient will let us know if she wishes to follow-up with [place] in the next 4-6 months. Otherwise she will follow-up on a when necessary basis.    Jodelle Gross, MD, PhD

## 2015-11-11 ENCOUNTER — Encounter: Payer: Self-pay | Admitting: Radiation Oncology

## 2015-11-23 ENCOUNTER — Encounter (HOSPITAL_COMMUNITY): Payer: Self-pay

## 2015-11-23 ENCOUNTER — Encounter (HOSPITAL_COMMUNITY)
Admission: RE | Admit: 2015-11-23 | Discharge: 2015-11-23 | Disposition: A | Discharge status: Home / Self Care | Payer: Federal, State, Local not specified - PPO | Source: Ambulatory Visit | Attending: General Surgery | Admitting: General Surgery

## 2015-11-23 HISTORY — DX: Malignant neoplasm of rectum: C20

## 2015-11-23 LAB — COMPREHENSIVE METABOLIC PANEL
ALBUMIN: 4.4 g/dL (ref 3.5–5.0)
ALT: 34 U/L (ref 14–54)
ANION GAP: 9 (ref 5–15)
AST: 33 U/L (ref 15–41)
Alkaline Phosphatase: 95 U/L (ref 38–126)
BILIRUBIN TOTAL: 0.7 mg/dL (ref 0.3–1.2)
BUN: 14 mg/dL (ref 6–20)
CHLORIDE: 104 mmol/L (ref 101–111)
CO2: 28 mmol/L (ref 22–32)
Calcium: 9.6 mg/dL (ref 8.9–10.3)
Creatinine, Ser: 0.68 mg/dL (ref 0.44–1.00)
GFR calc Af Amer: >60 mL/min (ref >60)
GLUCOSE: 90 mg/dL (ref 65–99)
POTASSIUM: 4.5 mmol/L (ref 3.5–5.1)
Sodium: 141 mmol/L (ref 135–145)
TOTAL PROTEIN: 7 g/dL (ref 6.5–8.1)

## 2015-11-23 LAB — CBC
HEMATOCRIT: 40.3 % (ref 36.0–46.0)
HEMOGLOBIN: 13.5 g/dL (ref 12.0–15.0)
MCH: 31 pg (ref 26.0–34.0)
MCHC: 33.5 g/dL (ref 30.0–36.0)
MCV: 92.6 fL (ref 78.0–100.0)
Platelets: 318 10*3/uL (ref 150–400)
RBC: 4.35 MIL/uL (ref 3.87–5.11)
RDW: 15.3 % (ref 11.5–15.5)
WBC: 4.5 10*3/uL (ref 4.0–10.5)

## 2015-11-23 LAB — ABO/RH: ABO/RH(D): B POS

## 2015-11-23 NOTE — Patient Instructions (Addendum)
YOUR PROCEDURE IS SCHEDULED ON : 11/26/15  REPORT TO [place] MAIN ENTRANCE FOLLOW SIGNS TO EAST ELEVATOR - GO TO 3rd FLOOR CHECK IN AT 3 EAST NURSES STATION (SHORT STAY) AT: 5:30 AM  CALL THIS NUMBER IF YOU HAVE PROBLEMS THE MORNING OF SURGERY [phone]  REMEMBER:ONLY 1 PER PERSON MAY GO TO SHORT STAY WITH YOU TO GET READY THE MORNING OF YOUR SURGERY  DO NOT EAT FOOD OR DRINK LIQUIDS AFTER MIDNIGHT  TAKE THESE MEDICINES THE MORNING OF SURGERY: NONE  FOLLOW BOWEL PREP FROM OFFICE  YOU MAY NOT HAVE ANY METAL ON YOUR BODY INCLUDING HAIR PINS AND PIERCING'S. DO NOT WEAR JEWELRY, MAKEUP, LOTIONS, POWDERS OR PERFUMES. DO NOT WEAR NAIL POLISH. DO NOT SHAVE 48 HRS PRIOR TO SURGERY. MEN MAY SHAVE FACE AND NECK.  DO NOT [place]. [facility] IS NOT RESPONSIBLE FOR VALUABLES.  CONTACTS, DENTURES OR PARTIALS MAY NOT BE WORN TO SURGERY. LEAVE SUITCASE IN CAR. CAN BE BROUGHT TO ROOM AFTER SURGERY.  PATIENTS DISCHARGED THE DAY OF SURGERY WILL NOT BE ALLOWED TO DRIVE HOME.  PLEASE READ OVER THE FOLLOWING INSTRUCTION SHEETS _________________________________________________________________________________                                          [place] - PREPARING FOR SURGERY  Before surgery, you can play an important role.  Because skin is not sterile, your skin needs to be as free of germs as possible.  You can reduce the number of germs on your skin by washing with CHG (chlorahexidine gluconate) soap before surgery.  CHG is an antiseptic cleaner which kills germs and bonds with the skin to continue killing germs even after washing. Please DO NOT use if you have an allergy to CHG or antibacterial soaps.  If your skin becomes reddened/irritated stop using the CHG and inform your nurse when you arrive at Short Stay. Do not shave (including legs and underarms) for at least 48 hours prior to the first CHG shower.  You may shave your face. Please  follow these instructions carefully:   1.  Shower with CHG Soap the night before surgery and the  morning of Surgery.   2.  If you choose to wash your hair, wash your hair first as usual with your  normal  Shampoo.   3.  After you shampoo, rinse your hair and body thoroughly to remove the  shampoo.                                         4.  Use CHG as you would any other liquid soap.  You can apply chg directly  to the skin and wash . Gently wash with scrungie or clean wascloth    5.  Apply the CHG Soap to your body ONLY FROM THE NECK DOWN.   Do not use on open                           Wound or open sores. Avoid contact with eyes, ears mouth and genitals (private parts).                        Genitals (private parts) with  your normal soap.              6.  Wash thoroughly, paying special attention to the area where your surgery  will be performed.   7.  Thoroughly rinse your body with warm water from the neck down.   8.  DO NOT shower/wash with your normal soap after using and rinsing off  the CHG Soap .                9.  Pat yourself dry with a clean towel.             10.  Wear clean night clothes to bed after shower             11.  Place clean sheets on your bed the night of your first shower and do not  sleep with pets.  Day of Surgery : Do not apply any lotions/deodorants the morning of surgery.  Please wear clean clothes to the hospital/surgery center.  FAILURE TO FOLLOW THESE INSTRUCTIONS MAY RESULT IN THE CANCELLATION OF YOUR SURGERY    PATIENT SIGNATURE_________________________________  ______________________________________________________________________    WHAT IS A BLOOD TRANSFUSION? Blood Transfusion Information  A transfusion is the replacement of blood or some of its parts. Blood is made up of multiple cells which provide different functions.  Red blood cells carry oxygen and are used for blood loss replacement.  White blood cells fight against  infection.  Platelets control bleeding.  Plasma helps clot blood.  Other blood products are available for specialized needs, such as hemophilia or other clotting disorders. BEFORE THE TRANSFUSION  Who gives blood for transfusions?   Healthy volunteers who are fully evaluated to make sure their blood is safe. This is blood bank blood. Transfusion therapy is the safest it has ever been in the practice of medicine. Before blood is taken from a donor, a complete history is taken to make sure that person has no history of diseases nor engages in risky social behavior (examples are intravenous drug use or sexual activity with multiple partners). The donor's travel history is screened to minimize risk of transmitting infections, such as malaria. The donated blood is tested for signs of infectious diseases, such as HIV and hepatitis. The blood is then tested to be sure it is compatible with you in order to minimize the chance of a transfusion reaction. If you or a relative donates blood, this is often done in anticipation of surgery and is not appropriate for emergency situations. It takes many days to process the donated blood. RISKS AND COMPLICATIONS Although transfusion therapy is very safe and saves many lives, the main dangers of transfusion include:   Getting an infectious disease.  Developing a transfusion reaction. This is an allergic reaction to something in the blood you were given. Every precaution is taken to prevent this. The decision to have a blood transfusion has been considered carefully by your caregiver before blood is given. Blood is not given unless the benefits outweigh the risks. AFTER THE TRANSFUSION  Right after receiving a blood transfusion, you will usually feel much better and more energetic. This is especially true if your red blood cells have gotten low (anemic). The transfusion raises the level of the red blood cells which carry oxygen, and this usually causes an energy  increase.  The nurse administering the transfusion will monitor you carefully for complications. HOME CARE INSTRUCTIONS  No special instructions are needed after a transfusion. You may find your energy is  better. Speak with your caregiver about any limitations on activity for underlying diseases you may have. SEEK MEDICAL CARE IF:   Your condition is not improving after your transfusion.  You develop redness or irritation at the intravenous (IV) site. SEEK IMMEDIATE MEDICAL CARE IF:  Any of the following symptoms occur over the next 12 hours:  Shaking chills.  You have a temperature by mouth above 102 F (38.9 C), not controlled by medicine.  Chest, back, or muscle pain.  People around you feel you are not acting correctly or are confused.  Shortness of breath or difficulty breathing.  Dizziness and fainting.  You get a rash or develop hives.  You have a decrease in urine output.  Your urine turns a dark color or changes to pink, red, or brown. Any of the following symptoms occur over the next 10 days:  You have a temperature by mouth above 102 F (38.9 C), not controlled by medicine.  Shortness of breath.  Weakness after normal activity.  The white part of the eye turns yellow (jaundice).  You have a decrease in the amount of urine or are urinating less often.  Your urine turns a dark color or changes to pink, red, or brown. Document Released: 08/11/2000 Document Revised: 11/06/2011 Document Reviewed: 03/30/2008 [facility] Patient Information 2014 [place], [place].  _______________________________________________________________________

## 2015-11-24 LAB — HEMOGLOBIN A1C
HEMOGLOBIN A1C: 5.3 % (ref 4.8–5.6)
MEAN PLASMA GLUCOSE: 105 mg/dL

## 2015-11-26 ENCOUNTER — Inpatient Hospital Stay (HOSPITAL_COMMUNITY)
Admission: RE | Admit: 2015-11-26 | Discharge: 2015-11-29 | DRG: 330 | Disposition: A | Discharge status: Home / Self Care | Payer: Federal, State, Local not specified - PPO | Source: Ambulatory Visit | Attending: General Surgery | Admitting: General Surgery

## 2015-11-26 ENCOUNTER — Inpatient Hospital Stay (HOSPITAL_COMMUNITY): Payer: Federal, State, Local not specified - PPO | Admitting: Anesthesiology

## 2015-11-26 ENCOUNTER — Encounter (HOSPITAL_COMMUNITY): Payer: Self-pay | Admitting: *Deleted

## 2015-11-26 ENCOUNTER — Encounter (HOSPITAL_COMMUNITY)
Admission: RE | Disposition: A | Discharge status: Home / Self Care | Payer: Self-pay | Source: Ambulatory Visit | Attending: General Surgery

## 2015-11-26 DIAGNOSIS — Z79899 Other long term (current) drug therapy: Secondary | ICD-10-CM

## 2015-11-26 DIAGNOSIS — Z01810 Encounter for preprocedural cardiovascular examination: Secondary | ICD-10-CM

## 2015-11-26 DIAGNOSIS — Z791 Long term (current) use of non-steroidal anti-inflammatories (NSAID): Secondary | ICD-10-CM | POA: Diagnosis not present

## 2015-11-26 DIAGNOSIS — Q438 Other specified congenital malformations of intestine: Secondary | ICD-10-CM

## 2015-11-26 DIAGNOSIS — C20 Malignant neoplasm of rectum: Secondary | ICD-10-CM | POA: Diagnosis present

## 2015-11-26 DIAGNOSIS — Z85820 Personal history of malignant melanoma of skin: Secondary | ICD-10-CM

## 2015-11-26 DIAGNOSIS — Z9221 Personal history of antineoplastic chemotherapy: Secondary | ICD-10-CM | POA: Diagnosis not present

## 2015-11-26 DIAGNOSIS — Z8249 Family history of ischemic heart disease and other diseases of the circulatory system: Secondary | ICD-10-CM

## 2015-11-26 DIAGNOSIS — Z923 Personal history of irradiation: Secondary | ICD-10-CM | POA: Diagnosis not present

## 2015-11-26 DIAGNOSIS — Z01812 Encounter for preprocedural laboratory examination: Secondary | ICD-10-CM

## 2015-11-26 DIAGNOSIS — F419 Anxiety disorder, unspecified: Secondary | ICD-10-CM | POA: Diagnosis present

## 2015-11-26 DIAGNOSIS — Z8371 Family history of colonic polyps: Secondary | ICD-10-CM | POA: Diagnosis not present

## 2015-11-26 HISTORY — PX: XI ROBOTIC ASSISTED LOWER ANTERIOR RESECTION: SHX6558

## 2015-11-26 LAB — TYPE AND SCREEN
ABO/RH(D): B POS
ANTIBODY SCREEN: NEGATIVE

## 2015-11-26 SURGERY — RESECTION, RECTUM, LOW ANTERIOR, ROBOT-ASSISTED
Anesthesia: General | Site: Abdomen

## 2015-11-26 MED ORDER — DEXTROSE 5 % IV SOLN
2.0000 g | INTRAVENOUS | Status: AC
  Administered 2015-11-26: 2 g via INTRAVENOUS
  Filled 2015-11-26: qty 2

## 2015-11-26 MED ORDER — RISAQUAD PO CAPS
1.0000 | ORAL_CAPSULE | Freq: Every day | ORAL | Status: DC
  Administered 2015-11-26 – 2015-11-29 (×4): 1 via ORAL
  Filled 2015-11-26 (×4): qty 1

## 2015-11-26 MED ORDER — HYDROMORPHONE HCL 1 MG/ML IJ SOLN
INTRAMUSCULAR | Status: DC | PRN
  Administered 2015-11-26: .2 mg via INTRAVENOUS
  Administered 2015-11-26: .4 mg via INTRAVENOUS
  Administered 2015-11-26 (×2): .2 mg via INTRAVENOUS
  Administered 2015-11-26: .4 mg via INTRAVENOUS

## 2015-11-26 MED ORDER — SODIUM CHLORIDE 0.9 % IJ SOLN
INTRAMUSCULAR | Status: AC
  Filled 2015-11-26: qty 50

## 2015-11-26 MED ORDER — PHENYLEPHRINE 40 MCG/ML (10ML) SYRINGE FOR IV PUSH (FOR BLOOD PRESSURE SUPPORT)
PREFILLED_SYRINGE | INTRAVENOUS | Status: AC
  Filled 2015-11-26: qty 10

## 2015-11-26 MED ORDER — SCOPOLAMINE 1 MG/3DAYS TD PT72
MEDICATED_PATCH | TRANSDERMAL | Status: AC
  Filled 2015-11-26: qty 1

## 2015-11-26 MED ORDER — ONDANSETRON HCL 4 MG/2ML IJ SOLN
INTRAMUSCULAR | Status: AC
Start: 2015-11-26 — End: 2015-11-26
  Filled 2015-11-26: qty 2

## 2015-11-26 MED ORDER — DEXAMETHASONE SODIUM PHOSPHATE 10 MG/ML IJ SOLN
INTRAMUSCULAR | Status: AC
  Filled 2015-11-26: qty 1

## 2015-11-26 MED ORDER — BUPIVACAINE-EPINEPHRINE (PF) 0.25% -1:200000 IJ SOLN
INTRAMUSCULAR | Status: AC
  Filled 2015-11-26: qty 30

## 2015-11-26 MED ORDER — DEXTROSE 5 % IV SOLN
2.0000 g | Freq: Two times a day (BID) | INTRAVENOUS | Status: AC
  Administered 2015-11-26: 2 g via INTRAVENOUS
  Filled 2015-11-26: qty 2

## 2015-11-26 MED ORDER — DIPHENHYDRAMINE HCL 12.5 MG/5ML PO ELIX
12.5000 mg | ORAL_SOLUTION | Freq: Four times a day (QID) | ORAL | Status: DC | PRN
  Administered 2015-11-28 – 2015-11-29 (×3): 12.5 mg via ORAL
  Filled 2015-11-26 (×3): qty 5

## 2015-11-26 MED ORDER — ONDANSETRON HCL 4 MG/2ML IJ SOLN: 4.0000 mg | Freq: Four times a day (QID) | INTRAMUSCULAR | Status: DC | PRN

## 2015-11-26 MED ORDER — MORPHINE SULFATE (PF) 2 MG/ML IV SOLN
2.0000 mg | INTRAVENOUS | Status: DC | PRN
  Administered 2015-11-27 (×3): 2 mg via INTRAVENOUS
  Administered 2015-11-27: 4 mg via INTRAVENOUS
  Filled 2015-11-26: qty 2
  Filled 2015-11-26 (×3): qty 1

## 2015-11-26 MED ORDER — ONDANSETRON HCL 4 MG PO TABS: 4.0000 mg | ORAL_TABLET | Freq: Four times a day (QID) | ORAL | Status: DC | PRN

## 2015-11-26 MED ORDER — ACETAMINOPHEN 10 MG/ML IV SOLN
INTRAVENOUS | Status: AC
  Filled 2015-11-26: qty 100

## 2015-11-26 MED ORDER — PHENYLEPHRINE HCL 10 MG/ML IJ SOLN
INTRAMUSCULAR | Status: DC | PRN
  Administered 2015-11-26: 80 ug via INTRAVENOUS
  Administered 2015-11-26: 40 ug via INTRAVENOUS
  Administered 2015-11-26: 120 ug via INTRAVENOUS
  Administered 2015-11-26 (×2): 40 ug via INTRAVENOUS
  Administered 2015-11-26 (×4): 80 ug via INTRAVENOUS

## 2015-11-26 MED ORDER — ZOLPIDEM TARTRATE 5 MG PO TABS
5.0000 mg | ORAL_TABLET | Freq: Every evening | ORAL | Status: DC | PRN
  Administered 2015-11-28: 5 mg via ORAL
  Filled 2015-11-26: qty 1

## 2015-11-26 MED ORDER — HYDROMORPHONE HCL 2 MG/ML IJ SOLN
INTRAMUSCULAR | Status: AC
  Filled 2015-11-26: qty 1

## 2015-11-26 MED ORDER — ROCURONIUM BROMIDE 100 MG/10ML IV SOLN
INTRAVENOUS | Status: DC | PRN
  Administered 2015-11-26: 40 mg via INTRAVENOUS
  Administered 2015-11-26 (×3): 20 mg via INTRAVENOUS

## 2015-11-26 MED ORDER — KCL IN DEXTROSE-NACL 20-5-0.45 MEQ/L-%-% IV SOLN
INTRAVENOUS | Status: DC
  Administered 2015-11-26: 75 mL/h via INTRAVENOUS
  Administered 2015-11-27 – 2015-11-28 (×4): via INTRAVENOUS
  Filled 2015-11-26 (×6): qty 1000

## 2015-11-26 MED ORDER — FENTANYL CITRATE (PF) 250 MCG/5ML IJ SOLN
INTRAMUSCULAR | Status: AC
  Filled 2015-11-26: qty 5

## 2015-11-26 MED ORDER — ACETAMINOPHEN 10 MG/ML IV SOLN
INTRAVENOUS | Status: DC | PRN
  Administered 2015-11-26: 1000 mg via INTRAVENOUS

## 2015-11-26 MED ORDER — SUGAMMADEX SODIUM 200 MG/2ML IV SOLN
INTRAVENOUS | Status: AC
  Filled 2015-11-26: qty 2

## 2015-11-26 MED ORDER — PROPOFOL 10 MG/ML IV BOLUS
INTRAVENOUS | Status: AC
  Filled 2015-11-26: qty 20

## 2015-11-26 MED ORDER — ROCURONIUM BROMIDE 100 MG/10ML IV SOLN
INTRAVENOUS | Status: AC
  Filled 2015-11-26: qty 1

## 2015-11-26 MED ORDER — METOCLOPRAMIDE HCL 5 MG/ML IJ SOLN
INTRAMUSCULAR | Status: AC
  Filled 2015-11-26: qty 2

## 2015-11-26 MED ORDER — 0.9 % SODIUM CHLORIDE (POUR BTL) OPTIME
TOPICAL | Status: DC | PRN
  Administered 2015-11-26: 2000 mL

## 2015-11-26 MED ORDER — LACTATED RINGERS IV SOLN
INTRAVENOUS | Status: DC | PRN
  Administered 2015-11-26 (×3): via INTRAVENOUS

## 2015-11-26 MED ORDER — LIDOCAINE HCL (CARDIAC) 20 MG/ML IV SOLN
INTRAVENOUS | Status: DC | PRN
  Administered 2015-11-26: 50 mg via INTRAVENOUS

## 2015-11-26 MED ORDER — ACETAMINOPHEN 500 MG PO TABS
1000.0000 mg | ORAL_TABLET | Freq: Four times a day (QID) | ORAL | Status: AC
  Administered 2015-11-26: 1000 mg via ORAL
  Filled 2015-11-26: qty 2

## 2015-11-26 MED ORDER — SUGAMMADEX SODIUM 200 MG/2ML IV SOLN
INTRAVENOUS | Status: DC | PRN
  Administered 2015-11-26: 200 mg via INTRAVENOUS

## 2015-11-26 MED ORDER — ENOXAPARIN SODIUM 40 MG/0.4ML ~~LOC~~ SOLN
40.0000 mg | SUBCUTANEOUS | Status: DC
  Administered 2015-11-27 – 2015-11-29 (×3): 40 mg via SUBCUTANEOUS
  Filled 2015-11-26 (×4): qty 0.4

## 2015-11-26 MED ORDER — FENTANYL CITRATE (PF) 100 MCG/2ML IJ SOLN
INTRAMUSCULAR | Status: DC | PRN
Start: 2015-11-26 — End: 2015-11-26
  Administered 2015-11-26 (×5): 50 ug via INTRAVENOUS

## 2015-11-26 MED ORDER — LIDOCAINE HCL (CARDIAC) 20 MG/ML IV SOLN
INTRAVENOUS | Status: AC
  Filled 2015-11-26: qty 5

## 2015-11-26 MED ORDER — ALIGN 4 MG PO CAPS: 4.0000 mg | ORAL_CAPSULE | Freq: Every day | ORAL | Status: DC

## 2015-11-26 MED ORDER — BUPIVACAINE-EPINEPHRINE 0.25% -1:200000 IJ SOLN
INTRAMUSCULAR | Status: DC | PRN
  Administered 2015-11-26: 18 mL

## 2015-11-26 MED ORDER — LACTATED RINGERS IR SOLN
Status: DC | PRN
  Administered 2015-11-26: 1000 mL

## 2015-11-26 MED ORDER — ALVIMOPAN 12 MG PO CAPS
12.0000 mg | ORAL_CAPSULE | Freq: Two times a day (BID) | ORAL | Status: DC
  Administered 2015-11-27 – 2015-11-28 (×4): 12 mg via ORAL
  Filled 2015-11-26 (×6): qty 1

## 2015-11-26 MED ORDER — LACTATED RINGERS IV SOLN
INTRAVENOUS | Status: DC | PRN
  Administered 2015-11-26: 08:00:00 via INTRAVENOUS

## 2015-11-26 MED ORDER — SODIUM CHLORIDE 0.9 % IJ SOLN
INTRAMUSCULAR | Status: DC | PRN
  Administered 2015-11-26: 40 mL

## 2015-11-26 MED ORDER — BUPIVACAINE LIPOSOME 1.3 % IJ SUSP
20.0000 mL | Freq: Once | INTRAMUSCULAR | Status: AC
  Administered 2015-11-26: 20 mL
  Filled 2015-11-26: qty 20

## 2015-11-26 MED ORDER — ONDANSETRON HCL 4 MG/2ML IJ SOLN
INTRAMUSCULAR | Status: DC | PRN
  Administered 2015-11-26: 4 mg via INTRAVENOUS

## 2015-11-26 MED ORDER — HEPARIN SODIUM (PORCINE) 5000 UNIT/ML IJ SOLN
5000.0000 [IU] | Freq: Once | INTRAMUSCULAR | Status: AC
  Administered 2015-11-26: 5000 [IU] via SUBCUTANEOUS
  Filled 2015-11-26: qty 1

## 2015-11-26 MED ORDER — ALVIMOPAN 12 MG PO CAPS
12.0000 mg | ORAL_CAPSULE | Freq: Once | ORAL | Status: AC
  Administered 2015-11-26: 12 mg via ORAL
  Filled 2015-11-26: qty 1

## 2015-11-26 MED ORDER — MIDAZOLAM HCL 2 MG/2ML IJ SOLN
INTRAMUSCULAR | Status: AC
  Filled 2015-11-26: qty 2

## 2015-11-26 MED ORDER — METOCLOPRAMIDE HCL 5 MG/ML IJ SOLN
INTRAMUSCULAR | Status: DC | PRN
  Administered 2015-11-26 (×2): 5 mg via INTRAVENOUS

## 2015-11-26 MED ORDER — MIDAZOLAM HCL 5 MG/5ML IJ SOLN
INTRAMUSCULAR | Status: DC | PRN
  Administered 2015-11-26 (×2): 1 mg via INTRAVENOUS

## 2015-11-26 MED ORDER — FENTANYL CITRATE (PF) 100 MCG/2ML IJ SOLN: 25.0000 ug | INTRAMUSCULAR | Status: DC | PRN

## 2015-11-26 MED ORDER — CEFOTETAN DISODIUM-DEXTROSE 2-2.08 GM-% IV SOLR
INTRAVENOUS | Status: AC
  Filled 2015-11-26: qty 50

## 2015-11-26 MED ORDER — SCOPOLAMINE 1 MG/3DAYS TD PT72
MEDICATED_PATCH | TRANSDERMAL | Status: DC | PRN
  Administered 2015-11-26: 1 via TRANSDERMAL

## 2015-11-26 MED ORDER — EPHEDRINE SULFATE 50 MG/ML IJ SOLN
INTRAMUSCULAR | Status: DC | PRN
  Administered 2015-11-26 (×2): 5 mg via INTRAVENOUS
  Administered 2015-11-26: 10 mg via INTRAVENOUS
  Administered 2015-11-26: 5 mg via INTRAVENOUS

## 2015-11-26 MED ORDER — PROPOFOL 10 MG/ML IV BOLUS
INTRAVENOUS | Status: DC | PRN
  Administered 2015-11-26: 50 mg via INTRAVENOUS
  Administered 2015-11-26: 100 mg via INTRAVENOUS

## 2015-11-26 MED ORDER — ONDANSETRON HCL 4 MG/2ML IJ SOLN: 4.0000 mg | Freq: Once | INTRAMUSCULAR | Status: DC | PRN

## 2015-11-26 MED ORDER — DIPHENHYDRAMINE HCL 50 MG/ML IJ SOLN: 12.5000 mg | Freq: Four times a day (QID) | INTRAMUSCULAR | Status: DC | PRN

## 2015-11-26 SURGICAL SUPPLY — 90 items
BLADE EXTENDED COATED 6.5IN (ELECTRODE) IMPLANT
CANNULA REDUC XI 12-8 STAPL (CANNULA) ×1
CANNULA REDUC XI 12-8MM STAPL (CANNULA) ×1
CANNULA REDUCER 12-8 DVNC XI (CANNULA) ×1 IMPLANT
CELLS DAT CNTRL 66122 CELL SVR (MISCELLANEOUS) IMPLANT
CLIP LIGATING HEM O LOK PURPLE (MISCELLANEOUS) IMPLANT
CLIP LIGATING HEMOLOK MED (MISCELLANEOUS) IMPLANT
COUNTER NEEDLE 20 DBL MAG RED (NEEDLE) ×3 IMPLANT
COVER MAYO STAND STRL (DRAPES) ×6 IMPLANT
COVER TIP SHEARS 8 DVNC (MISCELLANEOUS) ×1 IMPLANT
COVER TIP SHEARS 8MM DA VINCI (MISCELLANEOUS) ×2
DECANTER SPIKE VIAL GLASS SM (MISCELLANEOUS) IMPLANT
DEVICE TROCAR PUNCTURE CLOSURE (ENDOMECHANICALS) IMPLANT
DRAIN CHANNEL 19F RND (DRAIN) ×3 IMPLANT
DRAPE ARM DVNC X/XI (DISPOSABLE) ×4 IMPLANT
DRAPE COLUMN DVNC XI (DISPOSABLE) ×1 IMPLANT
DRAPE DA VINCI XI ARM (DISPOSABLE) ×8
DRAPE DA VINCI XI COLUMN (DISPOSABLE) ×2
DRAPE SURG IRRIG POUCH 19X23 (DRAPES) ×3 IMPLANT
DRSG OPSITE POSTOP 4X10 (GAUZE/BANDAGES/DRESSINGS) IMPLANT
DRSG OPSITE POSTOP 4X6 (GAUZE/BANDAGES/DRESSINGS) ×3 IMPLANT
DRSG OPSITE POSTOP 4X8 (GAUZE/BANDAGES/DRESSINGS) IMPLANT
ELECT PENCIL ROCKER SW 15FT (MISCELLANEOUS) ×6 IMPLANT
ELECT REM PT RETURN 15FT ADLT (MISCELLANEOUS) ×3 IMPLANT
ENDOLOOP SUT PDS II  0 18 (SUTURE)
ENDOLOOP SUT PDS II 0 18 (SUTURE) IMPLANT
EVACUATOR SILICONE 100CC (DRAIN) ×3 IMPLANT
GAUZE SPONGE 4X4 12PLY STRL (GAUZE/BANDAGES/DRESSINGS) IMPLANT
GLOVE BIO SURGEON STRL SZ 6.5 (GLOVE) ×6 IMPLANT
GLOVE BIO SURGEONS STRL SZ 6.5 (GLOVE) ×3
GLOVE BIOGEL PI IND STRL 7.0 (GLOVE) ×3 IMPLANT
GLOVE BIOGEL PI INDICATOR 7.0 (GLOVE) ×6
GOWN STRL REUS W/TWL 2XL LVL3 (GOWN DISPOSABLE) ×9 IMPLANT
GOWN STRL REUS W/TWL XL LVL3 (GOWN DISPOSABLE) ×12 IMPLANT
HOLDER FOLEY CATH W/STRAP (MISCELLANEOUS) ×3 IMPLANT
LEGGING LITHOTOMY PAIR STRL (DRAPES) ×3 IMPLANT
LIQUID BAND (GAUZE/BANDAGES/DRESSINGS) ×3 IMPLANT
NEEDLE INSUFFLATION 14GA 120MM (NEEDLE) ×3 IMPLANT
PACK CARDIOVASCULAR III (CUSTOM PROCEDURE TRAY) ×3 IMPLANT
PACK COLON (CUSTOM PROCEDURE TRAY) ×3 IMPLANT
PORT LAP GEL ALEXIS MED 5-9CM (MISCELLANEOUS) IMPLANT
RTRCTR WOUND ALEXIS 18CM MED (MISCELLANEOUS)
SCISSORS LAP 5X35 DISP (ENDOMECHANICALS) ×3 IMPLANT
SEAL CANN UNIV 5-8 DVNC XI (MISCELLANEOUS) ×3 IMPLANT
SEAL XI 5MM-8MM UNIVERSAL (MISCELLANEOUS) ×6
SEALER VESSEL DA VINCI XI (MISCELLANEOUS) ×2
SEALER VESSEL EXT DVNC XI (MISCELLANEOUS) ×1 IMPLANT
SET BI-LUMEN FLTR TB AIRSEAL (TUBING) ×6 IMPLANT
SET TUBE IRRIG SUCTION NO TIP (IRRIGATION / IRRIGATOR) ×3 IMPLANT
SLEEVE XCEL OPT CAN 5 100 (ENDOMECHANICALS) IMPLANT
SOLUTION ELECTROLUBE (MISCELLANEOUS) ×3 IMPLANT
SPONGE DRAIN TRACH 4X4 STRL 2S (GAUZE/BANDAGES/DRESSINGS) ×3 IMPLANT
STAPLER 45 BLU RELOAD XI (STAPLE) ×1 IMPLANT
STAPLER 45 BLUE RELOAD XI (STAPLE) ×2
STAPLER 45 GREEN RELOAD XI (STAPLE)
STAPLER 45 GRN RELOAD XI (STAPLE) IMPLANT
STAPLER CANNULA SEAL DVNC XI (STAPLE) ×1 IMPLANT
STAPLER CANNULA SEAL XI (STAPLE) ×2
STAPLER CIRC ILS CVD 33MM 37CM (STAPLE) ×3 IMPLANT
STAPLER SHEATH (SHEATH) ×2
STAPLER SHEATH ENDOWRIST DVNC (SHEATH) ×1 IMPLANT
STAPLER VISISTAT 35W (STAPLE) ×3 IMPLANT
SUT NOVA 1 T20/GS 25DT (SUTURE) ×6 IMPLANT
SUT NYLON 3 0 (SUTURE) ×3 IMPLANT
SUT PDS AB 1 CTX 36 (SUTURE) IMPLANT
SUT PDS AB 1 TP1 96 (SUTURE) IMPLANT
SUT PROLENE 2 0 KS (SUTURE) ×3 IMPLANT
SUT SILK 2 0 (SUTURE) ×2
SUT SILK 2 0 SH CR/8 (SUTURE) ×3 IMPLANT
SUT SILK 2-0 18XBRD TIE 12 (SUTURE) ×1 IMPLANT
SUT SILK 3 0 (SUTURE) ×2
SUT SILK 3 0 SH CR/8 (SUTURE) ×3 IMPLANT
SUT SILK 3-0 18XBRD TIE 12 (SUTURE) ×1 IMPLANT
SUT V-LOC BARB 180 2/0GR6 GS22 (SUTURE)
SUT VIC AB 2-0 SH 18 (SUTURE) ×3 IMPLANT
SUT VIC AB 2-0 SH 27 (SUTURE) ×2
SUT VIC AB 2-0 SH 27X BRD (SUTURE) ×1 IMPLANT
SUT VIC AB 3-0 SH 18 (SUTURE) IMPLANT
SUT VIC AB 4-0 PS2 27 (SUTURE) ×6 IMPLANT
SUTURE V-LC BRB 180 2/0GR6GS22 (SUTURE) IMPLANT
SYRINGE 10CC LL (SYRINGE) ×3 IMPLANT
SYS LAPSCP GELPORT 120MM (MISCELLANEOUS)
SYSTEM LAPSCP GELPORT 120MM (MISCELLANEOUS) IMPLANT
TOWEL OR 17X26 10 PK STRL BLUE (TOWEL DISPOSABLE) ×3 IMPLANT
TOWEL OR NON WOVEN STRL DISP B (DISPOSABLE) ×3 IMPLANT
TRAY FOLEY W/METER SILVER 14FR (SET/KITS/TRAYS/PACK) ×3 IMPLANT
TRAY FOLEY W/METER SILVER 16FR (SET/KITS/TRAYS/PACK) IMPLANT
TROCAR BLADELESS OPT 5 100 (ENDOMECHANICALS) ×3 IMPLANT
TUBING CONNECTING 10 (TUBING) IMPLANT
TUBING CONNECTING 10' (TUBING)

## 2015-11-26 NOTE — Anesthesia Preprocedure Evaluation (Addendum)
Anesthesia Evaluation  Patient identified by MRN, date of birth, ID band Patient awake    Reviewed: Allergy & Precautions, NPO status , Patient's Chart, lab work & pertinent test results  Airway Mallampati: II  TM Distance: >3 FB Neck ROM: Full    Dental  (+) Teeth Intact, Dental Advisory Given   Pulmonary neg pulmonary ROS,    Pulmonary exam normal breath sounds clear to auscultation       Cardiovascular Exercise Tolerance: Good Normal cardiovascular exam Rhythm:Regular Rate:Normal  SVT s/p ablation   Neuro/Psych negative neurological ROS  negative psych ROS   GI/Hepatic negative GI ROS, Neg liver ROS, Rectal cancer   Endo/Other  negative endocrine ROS  Renal/GU negative Renal ROS     Musculoskeletal negative musculoskeletal ROS (+)   Abdominal   Peds  Hematology negative hematology ROS (+) 13.5/40.2   Anesthesia Other Findings Day of surgery medications reviewed with the patient.  Reproductive/Obstetrics                           Anesthesia Physical Anesthesia Plan  ASA: III  Anesthesia Plan: General   Post-op Pain Management:    Induction: Intravenous  Airway Management Planned: Oral ETT  Additional Equipment:   Intra-op Plan:   Post-operative Plan: Extubation in OR  Informed Consent: I have reviewed the patients History and Physical, chart, labs and discussed the procedure including the risks, benefits and alternatives for the proposed anesthesia with the patient or authorized representative who has indicated his/her understanding and acceptance.   Dental advisory given  Plan Discussed with: CRNA  Anesthesia Plan Comments: (Risks/benefits of general anesthesia discussed with patient including risk of damage to teeth, lips, gum, and tongue, nausea/vomiting, allergic reactions to medications, and the possibility of heart attack, stroke and death.  All patient questions  answered.  Patient wishes to proceed.  2nd PIV after induction)        Anesthesia Quick Evaluation

## 2015-11-26 NOTE — Op Note (Signed)
11/26/2015  11:09 AM  PATIENT:  Molly Vang  60 y.o. female  Patient Care Team: Arvella Nigh, MD as PCP - General (Obstetrics and Gynecology) [name], MD as Consulting Physician (General Surgery) Kyung Rudd, MD as Consulting Physician (Radiation Oncology) Mauri Pole, MD as Consulting Physician (Gastroenterology)  PRE-OPERATIVE DIAGNOSIS:  rectal cancer  POST-OPERATIVE DIAGNOSIS:  rectal cancer  PROCEDURE:  XI ROBOTIC ASSISTED LOWER ANTERIOR RESECTION  RIGID PROCTOSCOPY  SURGEON:  Surgeon(s): [name], MD Michael Boston, MD  ASSISTANT: Dr Johney [place]   ANESTHESIA:   local and general  EBL:  Total I/O In: 2000 [I.V.:2000] Out: 160 [Urine:110; Blood:50]  Delay start of Pharmacological VTE agent (>24hrs) due to surgical blood loss or risk of bleeding:  no  DRAINS: (68F) Jackson-Pratt drain(s) with closed bulb suction in the pelvis   SPECIMEN:  Source of Specimen:  rectosigmoid  DISPOSITION OF SPECIMEN:  PATHOLOGY  COUNTS:  YES  PLAN OF CARE: Admit to inpatient   PATIENT DISPOSITION:  PACU - hemodynamically stable.  INDICATION:    60 y.o. F with uT3 N0 rectal cancer, s/p chemoRT.  I recommended low anterior resection:  The anatomy & physiology of the digestive tract was discussed.  The pathophysiology was discussed.  Natural history risks without surgery was discussed.   I worked to give an overview of the disease and the frequent need to have multispecialty involvement.  I feel the risks of no intervention will lead to serious problems that outweigh the operative risks; therefore, I recommended a partial colectomy to remove the pathology.  Laparoscopic & open techniques were discussed.   Risks such as bleeding, infection, abscess, leak, reoperation, possible ostomy, hernia, heart attack, death, and other risks were discussed.  I noted a good likelihood this will help address the problem.   Goals of post-operative recovery were discussed as well.    The  patient expressed understanding & wished to proceed with surgery.  OR FINDINGS:   Patient had small anterior rectal mass at ~9cm in proximal rectum.  No obvious metastatic disease on visceral parietal peritoneum or liver.  The anastomosis rests ~7 cm from the anal verge by rigid proctoscopy.  DESCRIPTION:   Informed consent was confirmed.  The patient underwent general anaesthesia without difficulty.  The patient was positioned appropriately.  VTE prevention in place.  The patient's abdomen was clipped, prepped, & draped in a sterile fashion.  Surgical timeout confirmed our plan.  The patient was positioned in reverse Trendelenburg.  Abdominal entry was gained using a Varies needle. The abdomen was insufflated after testing that the needle was in the correct space.  An 21mm robotic port was placed.  Entry was clean.  Camera inspection revealed no injury.  Extra ports were carefully placed under direct laparoscopic visualization. A 41mm port was place in the RLQ.  A 38mm assist port was placed lateral to this.   I reflected the greater omentum and the upper abdomen the small bowel in the upper abdomen. The robotic console was not to the patient's left side. Robotic instruments were placed under direct visualization. I scored the base of peritoneum of the right side of the mesentery of the left colon from the ligament of Treitz to the peritoneal reflection of the mid rectum.  The patient had a long, redundant sigmoid colon.  I elevated the sigmoid mesentery and enetered into the retro-mesenteric plane. We were able to identify the left ureter and gonadal vessels. We kept those posterior within the retroperitoneum and elevated the  left colon mesentery off that. I did isolated IMA pedicle but did not ligate it yet.  I continued distally and got into the avascular plane posterior to the mesorectum. I dissected posteriorly down to the level of the coccyx. I then bluntly dissected the lateral borders using  retraction and cautery to divide at the mesenteric junction.  This allowed me to help mobilize the rectum as well by freeing the mesorectum off the sacrum.  I mobilized the peritoneal coverings towards the peritoneal reflection on both the right and left sides of the rectum. I then divided the peritoneal reflection anteriorly. I divided the rectovaginal septum using blunt dissection and electrocautery as needed.  I could see the right and left ureters laterally and stayed away from them.  I then inserted a rigid proctoscope and evaluated for the level of the tumor. I identified the tattoo in the proximal rectum. I then divided the mesentery using a robotic vessel sealer at the level of the tattoo.  A blue load robotic stapler was then used to transect the rectum. A second load was used to complete this.   I skeletonized the inferior mesenteric artery pedicle.  I went down to its takeoff from the aorta.   I isolated the inferior mesenteric vein off of the ligament of Treitz just cephalad to that as well.  After confirming the left ureter was out of the way, I went ahead and ligated the inferior mesenteric artery pedicle with bipolar robotic vessel sealer ~2cm above its takeoff from the aorta.   We ensured hemostasis. I then divided the remaining mesentery to the level of the descending colon using the robotic vessel sealer. This easily mobilized into the pelvis without any further dissection. The robot was then undocked and the 12 mm port site was enlarged. An Experiment wound protector was placed. The rectum and sigmoid was removed from the abdomen. A pursestring device was placed onto the proximal colon and the colon was divided using electrocautery. There was good mucosal bleeding noted and good perfusion of the tissues. The specimen was sent to pathology for further examination.  3-0 silk sutures were used to secure the pursestring into place. A 33 mm EEA anvil was then placed into the open colon and the  pursestring suture was tied tightly around this. This was then placed back into the abdomen and the cap was placed on the [place]. The abdomen was insufflated and an anastomosis was created using laparoscopic assistance. There was no leak of the anastomosis when tested with insufflation under water. The anastomosis rests proximally 7 cm from anal verge.  A 19 [place] Blake drain was placed into the pelvis and brought out through the 5 mm port site. This was secured with a 2-0 Novafil suture. The remaining abdomen appeared normal. There was no signs of metastatic disease along the peritoneum or liver. The abdomen was desufflated. The ports were removed. We then switched to clean gowns, gloves, drapes and instruments.  The extraction site fascia was closed using interrupted #1 Novafil sutures. The subcutaneous tissue was reapproximated using 2-0 Vicryl sutures. The skin was closed using a running 4-0 Vicryl subcuticular suture. The port sites were also closed using a 4-0 Vicryl subcuticular suture. Sterile dressings were placed. The patient was awakened from anesthesia and sent to the postanesthesia care unit in stable condition. All counts were correct per operating room staff.

## 2015-11-26 NOTE — Progress Notes (Signed)
Pt doing well post op.  No pain or nausea.  Has some facial crepitus and swelling, also in her right hand.  Otherwise doing well.    Rosario Adie, MD  [facility]

## 2015-11-26 NOTE — Progress Notes (Signed)
Dawn on 5W notified pt will be in room in 20 minutes.

## 2015-11-26 NOTE — Transfer of Care (Signed)
Immediate Anesthesia Transfer of Care Note  Patient: Molly Vang  Procedure(s) Performed: Procedure(s): XI ROBOTIC ASSISTED LOWER ANTERIOR RESECTION WITH RIGID PROCTOSCOPY (N/A)  Patient Location: PACU  Anesthesia Type:General  Level of Consciousness: Patient easily awoken, sedated, comfortable, cooperative, following commands, responds to stimulation.   Airway & Oxygen Therapy: Patient spontaneously breathing, ventilating well, oxygen via simple oxygen mask.  Post-op Assessment: Report given to PACU RN, vital signs reviewed and stable, moving all extremities.   Post vital signs: Reviewed and stable.  Complications: No apparent anesthesia complications

## 2015-11-26 NOTE — Anesthesia Procedure Notes (Signed)
Procedure Name: Intubation Date/Time: 11/26/2015 7:42 AM Performed by: Deliah Boston Pre-anesthesia Checklist: Patient identified, Emergency Drugs available, Suction available and Patient being monitored Patient Re-evaluated:Patient Re-evaluated prior to inductionOxygen Delivery Method: Circle System Utilized Preoxygenation: Pre-oxygenation with 100% oxygen Intubation Type: IV induction Ventilation: Mask ventilation without difficulty Laryngoscope Size: Mac and 3 Grade View: Grade I Tube type: Oral Tube size: 7.0 mm Number of attempts: 1 Airway Equipment and Method: Oral airway Placement Confirmation: ETT inserted through vocal cords under direct vision,  positive ETCO2 and breath sounds checked- equal and bilateral Secured at: 21 cm Tube secured with: Tape Dental Injury: Teeth and Oropharynx as per pre-operative assessment

## 2015-11-26 NOTE — H&P (Signed)
The patient is a 60 year old female who presents with colorectal cancer. 60yo F with newly diagnosed rectal cancer. She is s/p a colonoscopy in 2014 which showed a polyp with HGD. She developed some rectal bleeding with BM's over the past few months. It was felt to be hemorrhoidal but a colonoscopy was scheduled due to the findings on path from last colonoscopy. The colonoscopy revealed a rectal mass at 10cm, extending to 15 cm. It was not obstructive. Biopsies show andenocarcinoma. CT scans of the chest, abd and pelvis show no signs of metastatic disease. Ultrasound showed a T3 N0 mass approximately 3 cm long with the distal edge located approximately 8 cm from the anal verge. The lateral edges were injected with spot. CEA was 1.3. The ultrasound did reveal some extraluminal masses. A follow-up vaginal ultrasound showed only fibroid disease.   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 Ambien (5MG  Tablet, Oral) Active. Ondansetron HCl (8MG  Tablet, Oral as needed) Active. Naproxen Sodium (220MG  Capsule, Oral) Active. Chlorpheniramine Maleate (4MG  Tablet, Oral) Active. Multi Vitamin (Oral) Active.  Social History [name], MD; Q000111Q 12:23 PM) Tobacco use Never smoker. No drug use Alcohol use  Occasional alcohol use. Caffeine use Carbonated beverages, Coffee, Tea.  Family History [name], MD; Q000111Q 12:23 PM) Alcohol Abuse Family Members In General, Father, Mother. Colon Polyps Brother, Father, Mother, Sister. Hypertension Family Members In General, Father, Mother. Ischemic Bowel Disease Brother, Daughter.  Pregnancy / Birth History [name], MD; Q000111Q 12:23 PM) Age at menarche 52 years. Age of menopause 53-55 Para 2 Gravida 2 Irregular periods Maternal age 27-30     Review of Systems  General Not Present- Appetite Loss, Chills, Fatigue, Fever, Night Sweats, Weight Gain and Weight Loss. Skin Present- Change in Wart/Mole. Not Present- Dryness, Hives, Jaundice, New Lesions, Non-Healing Wounds, Rash and Ulcer. HEENT Present- Wears glasses/contact lenses. Not Present- Earache, Hearing Loss, Hoarseness, Nose Bleed, Oral Ulcers, Ringing in the Ears, Seasonal Allergies, Sinus Pain, Sore Throat, Visual Disturbances and Yellow Eyes. Breast Not Present- Breast Mass, Breast Pain, Nipple Discharge and Skin Changes. Cardiovascular Not Present- Chest Pain, Difficulty Breathing Lying Down, Leg Cramps, Palpitations, Rapid Heart Rate, Shortness of Breath and Swelling of Extremities. Gastrointestinal Present- Change in Bowel Habits. Not Present- Abdominal Pain, Bloating, Bloody Stool, Chronic diarrhea, Constipation, Difficulty Swallowing, Excessive gas, Gets full quickly at meals, Hemorrhoids, Indigestion, Nausea, Rectal Pain and Vomiting. Female Genitourinary Not Present- Frequency, Nocturia, Painful Urination, Pelvic Pain and Urgency. Musculoskeletal Not Present- Back Pain, Joint Pain, Joint Stiffness, Muscle Pain, Muscle Weakness and Swelling of Extremities. Neurological Not Present- Decreased Memory, Fainting, Headaches, Numbness, Seizures, Tingling, Tremor, Trouble walking and Weakness. Psychiatric Present- Anxiety. Not Present- Bipolar, Change in Sleep  Pattern, Depression, Fearful and Frequent crying. Hematology Not Present- Easy Bruising, Excessive bleeding, Gland problems, HIV and Persistent Infections.  BP 129/85 mmHg  Pulse 106  Temp(Src) 97.8 F (36.6 C) (Oral)  Resp 16  Ht 5' 7.5" (1.715 m)  Wt 60.782 kg (134 lb)  BMI 20.67 kg/m2  SpO2 98%    Physical Exam   General Mental Status-Alert. General Appearance-Not in acute distress. Build & Nutrition-Well nourished. Posture-Normal posture. Gait-Normal.  Head and Neck Head-normocephalic, atraumatic with no lesions or palpable masses. Trachea-midline.  Chest and Lung Exam Chest and lung exam reveals -on auscultation, normal breath sounds, no adventitious sounds and normal vocal resonance.  Cardiovascular Cardiovascular examination reveals -normal heart sounds, regular rate and rhythm with no murmurs.  Abdomen Inspection Inspection of the abdomen reveals - No Hernias. Palpation/Percussion Palpation and Percussion of the abdomen reveal - Soft, Non Tender, No Rigidity (guarding), No hepatosplenomegaly and No Palpable abdominal masses.  Neurologic Neurologic evaluation reveals -alert and oriented x 3 with no impairment of recent or remote memory, normal attention span and ability to concentrate, normal sensation and normal coordination.  Musculoskeletal Normal Exam - Bilateral-Upper Extremity Strength Normal and Lower Extremity Strength Normal.    Assessment & Plan   RECTAL CANCER (C20) Impression: 60 year old female who presents to the office with rectal cancer status post neoadjuvant chemotherapy and radiation. She is now ready to schedule surgery. We have discussed this in detail. The surgery and anatomy were described to the patient as well as the risks of surgery and the possible complications. These include: Bleeding, deep abdominal infections and possible wound complications such as hernia and infection, damage to adjacent structures, leak  of surgical connections, which can lead to other surgeries and possibly an ostomy, possible need for other procedures, such as abscess drains in radiology, possible prolonged hospital stay, possible diarrhea from removal of part of the colon, possible constipation from narcotics, possible bowel, bladder or sexual dysfunction if having rectal surgery, prolonged fatigue/weakness or appetite loss, possible early recurrence of of disease, possible complications of their medical problems such as heart disease or arrhythmias or lung problems, death (less than 1%). I believe the patient understands and wishes to proceed with the surgery.

## 2015-11-26 NOTE — Anesthesia Postprocedure Evaluation (Signed)
Anesthesia Post Note  Patient: Molly Vang  Procedure(s) Performed: Procedure(s) (LRB): XI ROBOTIC ASSISTED LOWER ANTERIOR RESECTION WITH RIGID PROCTOSCOPY (N/A)  Patient location during evaluation: PACU Anesthesia Type: General Level of consciousness: awake and alert Pain management: pain level controlled Vital Signs Assessment: post-procedure vital signs reviewed and stable Respiratory status: spontaneous breathing, nonlabored ventilation, respiratory function stable and patient connected to nasal cannula oxygen Cardiovascular status: blood pressure returned to baseline and stable Postop Assessment: no signs of nausea or vomiting Anesthetic complications: no Comments: Face, neck, and BUE crepitus noted post-operatively.  Informed Dr. Marcello Moores of findings. Patient doing well, VSS.    Last Vitals:  Filed Vitals:   11/26/15 1315 11/26/15 1415  BP: 108/55 104/54  Pulse: 99 98  Temp: 36.4 C 36.5 C  Resp: 18 18    Last Pain:  Filed Vitals:   11/26/15 1432  PainSc: 0-No pain                 Catalina Gravel

## 2015-11-27 LAB — BASIC METABOLIC PANEL
ANION GAP: 8 (ref 5–15)
BUN: 7 mg/dL (ref 6–20)
CO2: 26 mmol/L (ref 22–32)
Calcium: 8.6 mg/dL — ABNORMAL LOW (ref 8.9–10.3)
Chloride: 109 mmol/L (ref 101–111)
Creatinine, Ser: 0.71 mg/dL (ref 0.44–1.00)
GFR calc Af Amer: >60 mL/min (ref >60)
GLUCOSE: 114 mg/dL — AB (ref 65–99)
POTASSIUM: 4.1 mmol/L (ref 3.5–5.1)
Sodium: 143 mmol/L (ref 135–145)

## 2015-11-27 LAB — CBC
HEMATOCRIT: 33 % — AB (ref 36.0–46.0)
HEMOGLOBIN: 11.2 g/dL — AB (ref 12.0–15.0)
MCH: 31.5 pg (ref 26.0–34.0)
MCHC: 33.9 g/dL (ref 30.0–36.0)
MCV: 92.7 fL (ref 78.0–100.0)
Platelets: 257 10*3/uL (ref 150–400)
RBC: 3.56 MIL/uL — ABNORMAL LOW (ref 3.87–5.11)
RDW: 15.4 % (ref 11.5–15.5)
WBC: 11.1 10*3/uL — ABNORMAL HIGH (ref 4.0–10.5)

## 2015-11-27 NOTE — Progress Notes (Signed)
  Progress Note: General Surgery Service   Subjective: Feels crackling and swelling in face, neck and right arm. Had some phlegm coughed up. Pain well controlled with 1 morphine dose. Ambulated 2x overnigh  Objective: Vital signs in last 24 hours: Temp:  [97.3 F (36.3 C)-98.4 F (36.9 C)] 98.4 F (36.9 C) (04/01 0547) Pulse Rate:  [73-103] 84 (04/01 0547) Resp:  [9-18] 16 (04/01 0547) BP: (99-116)/(53-91) 116/64 mmHg (04/01 0547) SpO2:  [97 %-100 %] 100 % (04/01 0547)    Intake/Output from previous day: 03/31 0701 - 04/01 0700 In: 4650 [P.O.:120; I.V.:4530] Out: 2045 [Urine:1840; Drains:155; Blood:50] Intake/Output this shift:    Lungs: CTAB  Cardiovascular: RRR  Abd: soft, ATTP, ND, wounds c/d/i  Extremities: no edema, crepitus noted over neck and right forearm  Neuro: AOx4  Lab Results: CBC   Recent Labs  11/27/15 0450  WBC 11.1*  HGB 11.2*  HCT 33.0*  PLT 257   BMET  Recent Labs  11/27/15 0450  NA 143  K 4.1  CL 109  CO2 26  GLUCOSE 114*  BUN 7  CREATININE 0.71  CALCIUM 8.6*   PT/INR No results for input(s): LABPROT, INR in the last 72 hours. ABG No results for input(s): PHART, HCO3 in the last 72 hours.  Invalid input(s): PCO2, PO2  Studies/Results:  Anti-infectives: Anti-infectives    Start     Dose/Rate Route Frequency Ordered Stop   11/26/15 2000  cefoTEtan (CEFOTAN) 2 g in dextrose 5 % 50 mL IVPB     2 g 100 mL/hr over 30 Minutes Intravenous Every 12 hours 11/26/15 1229 11/26/15 2045   11/26/15 0554  cefoTEtan (CEFOTAN) 2 g in dextrose 5 % 50 mL IVPB     2 g 100 mL/hr over 30 Minutes Intravenous On call to O.R. 11/26/15 0554 11/26/15 0751      Medications: Scheduled Meds: . acetaminophen  1,000 mg Oral 4 times per day  . acidophilus  1 capsule Oral Daily  . alvimopan  12 mg Oral BID  . enoxaparin (LOVENOX) injection  40 mg Subcutaneous Q24H   Continuous Infusions: . dextrose 5 % and 0.45 % NaCl with KCl 20 mEq/L 75 mL/hr  at 11/27/15 0332   PRN Meds:.diphenhydrAMINE **OR** diphenhydrAMINE, morphine injection, ondansetron **OR** ondansetron (ZOFRAN) IV, zolpidem  Assessment/Plan: Patient Active Problem List   Diagnosis Date Noted  . Rectal cancer ([place]) 11/26/2015  . Vitamin D deficiency 10/05/2015  . Fever 09/29/2015  . Dehydration 09/29/2015  . Transaminitis 09/29/2015  . Abdominal pain 09/29/2015  . Encounter for chemotherapy management 08/31/2015  . Rectal adenocarcinoma ([place]) 08/13/2015  . Tubular adenoma of colon 07/21/2015  . Rectal bleeding 07/20/2015  . SUPRAVENTRICULAR TACHYCARDIA  08/23/2009   s/p Procedure(s): XI ROBOTIC ASSISTED LOWER ANTERIOR RESECTION WITH RIGID PROCTOSCOPY 11/26/2015 -crepitus noted- should resolve in next days -ambulating well- continue -pain control with IV meds -clears today -continue foley -IS   LOS: 1 day   Mickeal Skinner, MD Pg# [phone] [facility]

## 2015-11-28 LAB — BASIC METABOLIC PANEL
Anion gap: 5 (ref 5–15)
BUN: 5 mg/dL — AB (ref 6–20)
CO2: 27 mmol/L (ref 22–32)
Calcium: 8.6 mg/dL — ABNORMAL LOW (ref 8.9–10.3)
Chloride: 109 mmol/L (ref 101–111)
Creatinine, Ser: 0.58 mg/dL (ref 0.44–1.00)
GFR calc Af Amer: >60 mL/min (ref >60)
GLUCOSE: 104 mg/dL — AB (ref 65–99)
POTASSIUM: 4.1 mmol/L (ref 3.5–5.1)
Sodium: 141 mmol/L (ref 135–145)

## 2015-11-28 LAB — CBC
HEMATOCRIT: 35.1 % — AB (ref 36.0–46.0)
Hemoglobin: 11.5 g/dL — ABNORMAL LOW (ref 12.0–15.0)
MCH: 30.8 pg (ref 26.0–34.0)
MCHC: 32.8 g/dL (ref 30.0–36.0)
MCV: 94.1 fL (ref 78.0–100.0)
Platelets: 270 10*3/uL (ref 150–400)
RBC: 3.73 MIL/uL — ABNORMAL LOW (ref 3.87–5.11)
RDW: 15.8 % — AB (ref 11.5–15.5)
WBC: 7.5 10*3/uL (ref 4.0–10.5)

## 2015-11-28 MED ORDER — OXYCODONE-ACETAMINOPHEN 5-325 MG PO TABS
1.0000 | ORAL_TABLET | ORAL | Status: DC | PRN
  Administered 2015-11-28 – 2015-11-29 (×5): 1 via ORAL
  Filled 2015-11-28 (×5): qty 1

## 2015-11-28 NOTE — Progress Notes (Signed)
  Progress Note: General Surgery Service   Subjective: Pain well controlled, feels face less swollen, still tender/sore over chest. Tolerated liquids well  Objective: Vital signs in last 24 hours: Temp:  [98.3 F (36.8 C)-98.9 F (37.2 C)] 98.6 F (37 C) (04/02 0605) Pulse Rate:  [86-90] 90 (04/02 0605) Resp:  [18] 18 (04/02 0605) BP: (110-126)/(66-96) 126/78 mmHg (04/02 0608) SpO2:  [99 %-100 %] 99 % (04/02 0605) Last BM Date:  (pta)  Intake/Output from previous day: 04/01 0701 - 04/02 0700 In: 1978.8 [P.O.:1080; I.V.:898.8] Out: 5100 [Urine:5000; Drains:100] Intake/Output this shift:    Lungs: CTAB  Cardiovascular: RRR  Abd: soft, ATTP, ND, no erythema, drain with serosang output  Extremities: no edema  Neuro: AOxr  Lab Results: CBC   Recent Labs  11/27/15 0450 11/28/15 0422  WBC 11.1* 7.5  HGB 11.2* 11.5*  HCT 33.0* 35.1*  PLT 257 270   BMET  Recent Labs  11/27/15 0450 11/28/15 0422  NA 143 141  K 4.1 4.1  CL 109 109  CO2 26 27  GLUCOSE 114* 104*  BUN 7 5*  CREATININE 0.71 0.58  CALCIUM 8.6* 8.6*   PT/INR No results for input(s): LABPROT, INR in the last 72 hours. ABG No results for input(s): PHART, HCO3 in the last 72 hours.  Invalid input(s): PCO2, PO2  Studies/Results:  Anti-infectives: Anti-infectives    Start     Dose/Rate Route Frequency Ordered Stop   11/26/15 2000  cefoTEtan (CEFOTAN) 2 g in dextrose 5 % 50 mL IVPB     2 g 100 mL/hr over 30 Minutes Intravenous Every 12 hours 11/26/15 1229 11/26/15 2045   11/26/15 0554  cefoTEtan (CEFOTAN) 2 g in dextrose 5 % 50 mL IVPB     2 g 100 mL/hr over 30 Minutes Intravenous On call to O.R. 11/26/15 0554 11/26/15 0751      Medications: Scheduled Meds: . acidophilus  1 capsule Oral Daily  . alvimopan  12 mg Oral BID  . enoxaparin (LOVENOX) injection  40 mg Subcutaneous Q24H   Continuous Infusions: . dextrose 5 % and 0.45 % NaCl with KCl 20 mEq/L 75 mL/hr at 11/27/15 2316   PRN  Meds:.diphenhydrAMINE **OR** diphenhydrAMINE, morphine injection, ondansetron **OR** ondansetron (ZOFRAN) IV, oxyCODONE-acetaminophen, zolpidem  Assessment/Plan: Patient Active Problem List   Diagnosis Date Noted  . Rectal cancer ([place]) 11/26/2015  . Vitamin D deficiency 10/05/2015  . Fever 09/29/2015  . Dehydration 09/29/2015  . Transaminitis 09/29/2015  . Abdominal pain 09/29/2015  . Encounter for chemotherapy management 08/31/2015  . Rectal adenocarcinoma ([place]) 08/13/2015  . Tubular adenoma of colon 07/21/2015  . Rectal bleeding 07/20/2015  . SUPRAVENTRICULAR TACHYCARDIA  08/23/2009   s/p Procedure(s): XI ROBOTIC ASSISTED LOWER ANTERIOR RESECTION WITH RIGID PROCTOSCOPY 11/26/2015 Crepitus much improved but still present over chest and abdomen -advance diet -oral pain medications -await return of bowel function.   LOS: 2 days   Mickeal Skinner, MD Pg# [phone] [facility]

## 2015-11-29 ENCOUNTER — Encounter (HOSPITAL_COMMUNITY): Payer: Self-pay | Admitting: General Surgery

## 2015-11-29 LAB — CBC
HEMATOCRIT: 34.2 % — AB (ref 36.0–46.0)
Hemoglobin: 11.6 g/dL — ABNORMAL LOW (ref 12.0–15.0)
MCH: 31.2 pg (ref 26.0–34.0)
MCHC: 33.9 g/dL (ref 30.0–36.0)
MCV: 91.9 fL (ref 78.0–100.0)
PLATELETS: 255 10*3/uL (ref 150–400)
RBC: 3.72 MIL/uL — ABNORMAL LOW (ref 3.87–5.11)
RDW: 15.5 % (ref 11.5–15.5)
WBC: 5.3 10*3/uL (ref 4.0–10.5)

## 2015-11-29 LAB — BASIC METABOLIC PANEL
Anion gap: 6 (ref 5–15)
BUN: 5 mg/dL — AB (ref 6–20)
CO2: 29 mmol/L (ref 22–32)
CREATININE: 0.64 mg/dL (ref 0.44–1.00)
Calcium: 9 mg/dL (ref 8.9–10.3)
Chloride: 106 mmol/L (ref 101–111)
GFR calc Af Amer: >60 mL/min (ref >60)
GLUCOSE: 106 mg/dL — AB (ref 65–99)
POTASSIUM: 3.9 mmol/L (ref 3.5–5.1)
Sodium: 141 mmol/L (ref 135–145)

## 2015-11-29 MED ORDER — OXYCODONE-ACETAMINOPHEN 5-325 MG PO TABS: 1.0000 | ORAL_TABLET | ORAL | Status: DC | PRN

## 2015-11-29 NOTE — Discharge Instructions (Signed)

## 2015-11-29 NOTE — Progress Notes (Signed)
Pt had a small amount of blood mixed with stool this am. Will continue to monitor.

## 2015-11-29 NOTE — Discharge Summary (Signed)
Physician Discharge Summary  Patient ID: Shakhia Mcanelly MRN: [id number] DOB/AGE: 12/13/1955 60 y.o.  Admit date: 11/26/2015 Discharge date: 11/29/2015  Admission Diagnoses: Rectal cancer  Discharge Diagnoses:  Active Problems:   Rectal cancer [facility])   Discharged Condition: good  Hospital Course: Patient admitted after surgery.  Her diet was advanced without difficulty.  She began to have bowel function.  She was urinating well with her foley out.  Her pain was controlled with PO narcotics.  It was decided she was in stable  Condition for discharge home.  Consults: None  Significant Diagnostic Studies: labs: cbc, chemistry  Treatments: IV hydration, analgesia: acetaminophen w/ codeine and surgery: robotic LAR  Discharge Exam: Blood pressure 127/77, pulse 76, temperature 98 F (36.7 C), temperature source Oral, resp. rate 16, height 5' 7.5" (1.715 m), weight 60.782 kg (134 lb), SpO2 100 %. General appearance: alert and cooperative GI: normal findings: soft, non-distended Incision/Wound: clean, dry, intact JP: clear, serous fluid  Disposition: 01-Home or Self Care     Medication List    STOP taking these medications        capecitabine 500 MG tablet  Commonly known as:  XELODA      TAKE these medications        ALIGN 4 MG Caps  Take 4 mg by mouth daily.     aspirin EC 81 MG tablet  Take 81 mg by mouth daily. Reported on 10/08/2015     CALCIUM 600 + D PO  Take 1 tablet by mouth daily. Vit. D3 is 400iu     CALCIUM CITRATE-MAG-MINERALS PO  Take 1 tablet by mouth daily.     chlorhexidine 0.12 % solution  Commonly known as:  PERIDEX  Use as directed 15 mLs in the mouth or throat daily.     chlorpheniramine 4 MG tablet  Commonly known as:  CHLOR-TRIMETON  Take 4 mg by mouth 2 (two) times daily. Reported on 09/03/2015     ergocalciferol 50000 units capsule  Commonly known as:  VITAMIN D2  Take 1 capsule (50,000 Units total) by mouth once a week.     fish  oil-omega-3 fatty acids 1000 MG capsule  Take 1 g by mouth daily. EPA 570mg   And DHA 130mg      MULTIVITAMIN WOMEN 50+ PO  Take 1 tablet by mouth daily.     naproxen sodium 220 MG tablet  Commonly known as:  ANAPROX  Take 220 mg by mouth daily as needed (for pain). Reported on 09/28/2015     ondansetron 8 MG tablet  Commonly known as:  ZOFRAN  Take 1 tablet (8 mg total) by mouth every 8 (eight) hours as needed for nausea.     oxyCODONE-acetaminophen 5-325 MG tablet  Commonly known as:  PERCOCET/ROXICET  Take 1 tablet by mouth every 4 (four) hours as needed for moderate pain.     SYSTANE 0.4-0.3 % Soln  Generic drug:  Polyethyl Glycol-Propyl Glycol  Apply 1 drop to eye every evening.     zolpidem 10 MG tablet  Commonly known as:  AMBIEN  Take 5 mg by mouth at bedtime as needed for sleep.           Follow-up Information    Follow up with Rosario Adie., MD.   Specialty:  General Surgery   Why:  as scheduled   Contact information:   [address] [place] [address] [phone]       Signed: Rosario Adie [id number], [id number]  AM

## 2015-12-21 ENCOUNTER — Telehealth: Payer: Self-pay | Admitting: Hematology

## 2015-12-21 ENCOUNTER — Ambulatory Visit (HOSPITAL_BASED_OUTPATIENT_CLINIC_OR_DEPARTMENT_OTHER): Payer: Federal, State, Local not specified - PPO | Admitting: Hematology

## 2015-12-21 ENCOUNTER — Other Ambulatory Visit (HOSPITAL_BASED_OUTPATIENT_CLINIC_OR_DEPARTMENT_OTHER): Payer: Federal, State, Local not specified - PPO

## 2015-12-21 ENCOUNTER — Encounter: Payer: Self-pay | Admitting: Hematology

## 2015-12-21 VITALS — BP 116/79 | HR 81 | Temp 97.6°F | Resp 20 | Ht 67.5 in | Wt 133.7 lb

## 2015-12-21 DIAGNOSIS — R740 Nonspecific elevation of levels of transaminase and lactic acid dehydrogenase [LDH]: Secondary | ICD-10-CM

## 2015-12-21 DIAGNOSIS — C20 Malignant neoplasm of rectum: Secondary | ICD-10-CM | POA: Diagnosis not present

## 2015-12-21 LAB — CBC WITH DIFFERENTIAL/PLATELET
BASO%: 1.2 % (ref 0.0–2.0)
Basophils Absolute: 0.1 10*3/uL (ref 0.0–0.1)
EOS%: 11.7 % — AB (ref 0.0–7.0)
Eosinophils Absolute: 0.8 10*3/uL — ABNORMAL HIGH (ref 0.0–0.5)
HEMATOCRIT: 40.7 % (ref 34.8–46.6)
HGB: 13.2 g/dL (ref 11.6–15.9)
LYMPH#: 0.8 10*3/uL — AB (ref 0.9–3.3)
LYMPH%: 11.4 % — ABNORMAL LOW (ref 14.0–49.7)
MCH: 29.8 pg (ref 25.1–34.0)
MCHC: 32.5 g/dL (ref 31.5–36.0)
MCV: 91.8 fL (ref 79.5–101.0)
MONO#: 0.7 10*3/uL (ref 0.1–0.9)
MONO%: 9.3 % (ref 0.0–14.0)
NEUT%: 66.4 % (ref 38.4–76.8)
NEUTROS ABS: 4.8 10*3/uL (ref 1.5–6.5)
PLATELETS: 470 10*3/uL — AB (ref 145–400)
RBC: 4.43 10*6/uL (ref 3.70–5.45)
RDW: 14 % (ref 11.2–14.5)
WBC: 7.2 10*3/uL (ref 3.9–10.3)

## 2015-12-21 LAB — COMPREHENSIVE METABOLIC PANEL
ALK PHOS: 125 U/L (ref 40–150)
ALT: 29 U/L (ref 0–55)
AST: 28 U/L (ref 5–34)
Albumin: 4.1 g/dL (ref 3.5–5.0)
Anion Gap: 8 mEq/L (ref 3–11)
BILIRUBIN TOTAL: 0.45 mg/dL (ref 0.20–1.20)
BUN: 13.1 mg/dL (ref 7.0–26.0)
CALCIUM: 9.6 mg/dL (ref 8.4–10.4)
CO2: 28 meq/L (ref 22–29)
Chloride: 104 mEq/L (ref 98–109)
Creatinine: 0.8 mg/dL (ref 0.6–1.1)
EGFR: 79 mL/min/{1.73_m2} — ABNORMAL LOW (ref >90)
GLUCOSE: 94 mg/dL (ref 70–140)
Potassium: 3.9 mEq/L (ref 3.5–5.1)
Sodium: 140 mEq/L (ref 136–145)
TOTAL PROTEIN: 7.5 g/dL (ref 6.4–8.3)

## 2015-12-21 NOTE — Progress Notes (Signed)
[place]  [phone] Fax:([phone]) [phone]  Clinic follow up Note   Patient Care Team: Molly Nigh, MD as PCP - General (Obstetrics and Gynecology) Molly Ruff, MD as Consulting Physician (General Surgery) Molly Rudd, MD as Consulting Physician (Radiation Oncology) Molly Pole, MD as Consulting Physician (Gastroenterology)   CHIEF COMPLAINTS:  Follow up rectal cancer   Oncology History   Rectal adenocarcinoma [facility])   Staging form: Colon and Rectum, AJCC 7th Edition     Clinical stage from 08/04/2015: Stage IIA (T3, N0, M0) - Signed by Molly Merle, MD on 08/31/2015       Rectal adenocarcinoma ([place])   08/04/2015 Initial Biopsy rectal mass adenocarcinoma, descending colon polyp hyperplastic polyp.    08/04/2015 Procedure 5 cm rectal mass starting at 10 cm from anal verge extending to 15 cm, 33mm sigmoid polyp was removed    08/06/2015 Imaging CT chest, abdomen and pelvis w contrast showed a possible area of rectal wall thickening aterior to the distal sacrum, no adenopathy or distant metastasis    08/13/2015 Initial Diagnosis Rectal adenocarcinoma ([place])   08/19/2015 Procedure low EUS showed a T3N0 rectal mass    08/31/2015 -  Radiation Therapy radiation to rectal adenocarcinoma    08/31/2015 - 10/08/2015 Chemotherapy Capecitabine 1500mg  q12h with concurrent radiation, held after 09/29/2015 due to transaminitis    11/26/2015 Surgery Rectosigmoid segmental resection, surgical margins were negative.   11/26/2015 Pathology Results Rectosigmoid segmental resection showed invasive colonic adenocarcinoma, grade 2, T1, margins were negative, 15 lymph nodes were negative. LVI (-), PNI (-)    HISTORY OF PRESENTING ILLNESS (08/13/2015):  Molly Vang 60 y.o. female is here because of newly diagnosed rectal adenocarcinoma. She is accompanied by her husband to our multidisciplinary GI clinic today.  She had a colonoscopy in 05/2013 by Dr. Olevia Vang which showed a 3-4mm sessile  rectal polyp, which was removed, pathology revealed tubular adenoma. She has had intermittent rectal bleeding since Oct 2016, mild, she had mild constipation, no pain, nause or change of apeptie, no weihgt loss. She called Dr. Nichola Vang office, who has recently retired, and was seen by Dr. Silverio Vang. She underwent colonoscopy on 08/04/2015, which showed a 5 cm rectal mass starting at a 10 cm from an approach extending to 15 cm, and a 3 mm sigmoid polyp which was removed. The rectal mass biopsy showed adenocarcinoma. CT of the chest abdomen and pelvis with contrast was performed on 08/06/2015, which showed no adenopathy or distant metastasis.  She otherwise feels well, no other complains. She is a homemaker, also works as a English as a second language teacher. She eats healthy diet, and walks 5 miles a day, very physically active.   She had a early stage skin melanoma on her right arm, was removed a few years ago. She also has a basal cell skin cancer on her face, will need a resection soon. She otherwise is very healthy. No family history of colon cancer or other malignancy.   CURRENT THERAPY: Surveillance  INTERIM HISTORY Molly Vang returns for follow up. She is accompanied by her husband to the clinic today. She underwent laparoscopic rectosigmoid colon segmental resection and lymph node biopsy on March 31. She tolerated the surgery very well,, and has recovered very well. She denies any significant pain, abdominal discomfort, or other symptoms. Her bowel movement is normal. She remains to be physically active.  MEDICAL HISTORY:  Past Medical History  Diagnosis Date  . SVT (supraventricular tachycardia) ([place]) 06/11/2013    svt on 10/6// ablation on  10/15  . Cancer ([place])     melanoma right bicep removed  . Rectal cancer [facility])     SURGICAL HISTORY: Past Surgical History  Procedure Laterality Date  . Tonsillectomy  1960  . US echocardiography  11/14/11    trace TR,MR  . Supraventricular tachycardia ablation   06/11/2013  . Ovarian cyst surgery  1993  . Supraventricular tachycardia ablation N/A 06/11/2013    Procedure: SUPRAVENTRICULAR TACHYCARDIA ABLATION;  Surgeon: Molly Lance, MD;  Location: [facility] CATH LAB;  Service: Cardiovascular;  Laterality: N/A;  . Nm myocar perf wall motion  12/14/2010    normal  . Colonoscopy    . Eus N/A 08/19/2015    Procedure: LOWER ENDOSCOPIC ULTRASOUND (EUS);  Surgeon: Molly Banister, MD;  Location: [facility] ENDOSCOPY;  Service: Endoscopy;  Laterality: N/A;  . Xi robotic assisted lower anterior resection N/A 11/26/2015    Procedure: XI ROBOTIC ASSISTED LOWER ANTERIOR RESECTION WITH RIGID PROCTOSCOPY;  Surgeon: Molly Ruff, MD;  Location: [facility] ORS;  Service: General;  Laterality: N/A;    SOCIAL HISTORY: Social History   Social History  . Marital Status: Married    Spouse Name: N/A  . Number of Children: 2   . Years of Education: N/A   Occupational History  . She is a Materials engineer    Social History Main Topics  . Smoking status: Never Smoker   . Smokeless tobacco: Never Used  . Alcohol Use: Yes     Comment: 06/11/2013 "beer or glass of wine maybe once/month"  . Drug Use: No  . Sexual Activity: Yes   Other Topics Concern  . Not on file   Social History Narrative    FAMILY HISTORY: Family History  Problem Relation Age of Onset  . Hypertension Father   . Hyperlipidemia Father   . Hypertension Mother   . Hyperlipidemia Brother   . Colon cancer Neg Hx   . Stomach cancer Neg Hx   . Esophageal cancer Neg Hx   . Rectal cancer Neg Hx     ALLERGIES:  has No Known Allergies.  MEDICATIONS:  Current Outpatient Prescriptions  Medication Sig Dispense Refill  . aspirin EC 81 MG tablet Take 81 mg by mouth daily. Reported on 10/08/2015    . Calcium Carbonate-Vitamin D (CALCIUM 600 + D PO) Take 1 tablet by mouth daily. Vit. D3 is 400iu    . chlorhexidine (PERIDEX) 0.12 % solution Use as directed 15 mLs in the mouth or throat daily.    . chlorpheniramine  (CHLOR-TRIMETON) 4 MG tablet Take 4 mg by mouth 2 (two) times daily. Reported on 09/03/2015    . ergocalciferol (VITAMIN D2) 50000 units capsule Take 1 capsule (50,000 Units total) by mouth once a week. 8 capsule 0  . fish oil-omega-3 fatty acids 1000 MG capsule Take 1 g by mouth daily. EPA 570mg   And DHA 130mg     . Multiple Minerals-Vitamins (CALCIUM CITRATE-MAG-MINERALS PO) Take 1 tablet by mouth daily.    . Multiple Vitamins-Minerals (MULTIVITAMIN WOMEN 50+ PO) Take 1 tablet by mouth daily.    . naproxen sodium (ANAPROX) 220 MG tablet Take 220 mg by mouth daily as needed (for pain). Reported on 09/28/2015    . ondansetron (ZOFRAN) 8 MG tablet Take 1 tablet (8 mg total) by mouth every 8 (eight) hours as needed for nausea. (Patient not taking: Reported on 11/01/2015) 20 tablet 3  . oxyCODONE-acetaminophen (PERCOCET/ROXICET) 5-325 MG tablet Take 1 tablet by mouth every 4 (four) hours as needed for  moderate pain. 30 tablet 0  . Polyethyl Glycol-Propyl Glycol (SYSTANE) 0.4-0.3 % SOLN Apply 1 drop to eye every evening.    . Probiotic Product (ALIGN) 4 MG CAPS Take 4 mg by mouth daily.    Marland Kitchen zolpidem (AMBIEN) 10 MG tablet Take 5 mg by mouth at bedtime as needed for sleep.   2   No current facility-administered medications for this visit.    REVIEW OF SYSTEMS:   Constitutional: Denies fevers, chills or abnormal night sweats Eyes: Denies blurriness of vision, double vision or watery eyes Ears, nose, mouth, throat, and face: Denies mucositis or sore throat Respiratory: Denies cough, dyspnea or wheezes Cardiovascular: Denies palpitation, chest discomfort or lower extremity swelling Gastrointestinal:  Denies nausea, heartburn or change in bowel habits Skin: Denies abnormal skin rashes Lymphatics: Denies new lymphadenopathy or easy bruising Neurological:Denies numbness, tingling or new weaknesses Behavioral/Psych: Mood is stable, no new changes  All other systems were reviewed with the patient and are  negative.  PHYSICAL EXAMINATION: ECOG PERFORMANCE STATUS: 0 - Asymptomatic  Filed Vitals:   12/21/15 1311  BP: 116/79  Pulse: 81  Temp: 97.6 F (36.4 C)  Resp: 20   Filed Weights   12/21/15 1311  Weight: 133 lb 11.2 oz (60.646 kg)    GENERAL:alert, no distress and comfortable SKIN: skin color, texture, turgor are normal, no rashes or significant lesions EYES: normal, conjunctiva are pink and non-injected, sclera clear OROPHARYNX:no exudate, no erythema and lips, buccal mucosa, and tongue normal  NECK: supple, thyroid normal size, non-tender, without nodularity LYMPH:  no palpable lymphadenopathy in the cervical, axillary or inguinal LUNGS: clear to auscultation and percussion with normal breathing effort HEART: regular rate & rhythm and no murmurs and no lower extremity edema ABDOMEN:abdomen soft, non-tender and normal bowel sounds. Laparoscopic surgical incisions are well-healed. Musculoskeletal:no cyanosis of digits and no clubbing  PSYCH: alert & oriented x 3 with fluent speech NEURO: no focal motor/sensory deficits  LABORATORY DATA:  I have reviewed the data as listed CBC Latest Ref Rng 12/21/2015 11/29/2015 11/28/2015  WBC 3.9 - 10.3 10e3/uL 7.2 5.3 7.5  Hemoglobin 11.6 - 15.9 g/dL 13.2 11.6(L) 11.5(L)  Hematocrit 34.8 - 46.6 % 40.7 34.2(L) 35.1(L)  Platelets 145 - 400 10e3/uL 470(H) 255 270    CMP Latest Ref Rng 12/21/2015 11/29/2015 11/28/2015  Glucose 70 - 140 mg/dl 94 106(H) 104(H)  BUN 7.0 - 26.0 mg/dL 13.1 5(L) 5(L)  Creatinine 0.6 - 1.1 mg/dL 0.8 0.64 0.58  Sodium 136 - 145 mEq/L 140 141 141  Potassium 3.5 - 5.1 mEq/L 3.9 3.9 4.1  Chloride 101 - 111 mmol/L - 106 109  CO2 22 - 29 mEq/L 28 29 27   Calcium 8.4 - 10.4 mg/dL 9.6 9.0 8.6(L)  Total Protein 6.4 - 8.3 g/dL 7.5 - -  Total Bilirubin 0.20 - 1.20 mg/dL 0.45 - -  Alkaline Phos 40 - 150 U/L 125 - -  AST 5 - 34 U/L 28 - -  ALT 0 - 55 U/L 29 - -    PATHOLOGY REPORT: Diagnosis 11/26/2015 1. Colon, segmental  resection for tumor, rectosigmoid INVASIVE COLONIC ADENOCARCINOMA, GRADE 2 THE TUMOR INVADES SUBMUCOSA MARGINS OF RESECTION ARE NEGATIVE FOR TUMOR FIFTEEN BENIGN LYMPH NODES (0/15) 2. Colon, resection margin (donut), final distal BENIGN COLONIC TISSUE NEGATIVE FOR MALIGNANCY  Microscopic Comment 1. COLON AND RECTUM (INCLUDING TRANS-ANAL RESECTION): Specimen: Rectosigmoid colon Procedure: Segmental resection Tumor site: rectum Specimen integrity: Intact Macroscopic intactness of mesorectum: Not applicable: x Complete: NA Near complete: NA  Incomplete: NA Cannot be determined (specify): NA Macroscopic tumor perforation: Submucosa Invasive tumor: Maximum size: 0.4 cm Histologic type(s): Adenocarcinoma Histologic grade and differentiation: G2 G1: well differentiated/low grade G2: moderately differentiated/low grade G3: poorly differentiated/high grade G4: undifferentiated/high grade Type of polyp in which invasive carcinoma arose: Tubular adenoma Microscopic extension of invasive tumor: Submucosa Lymph-Vascular invasion: Negative Peri-neural invasion: Negative Tumor deposit(s) (discontinuous extramural extension): Negative Resection margins: Proximal margin: 7.1 cm Distal margin: 1.3 Circumferential (radial) (posterior ascending, posterior descending; lateral and posterior mid-rectum; and entire lower 1/3 rectum): 3.5 cm Mesenteric margin (sigmoid and transverse): NA Distance closest margin (if all above margins negative): 1.7 cm Trans-anal resection margins only: Deep margin: NA Mucosal Margin: NA Distance closest mucosal margin (if negative): NA Treatment effect (neo-adjuvant therapy): Present Additional polyp(s): Negative Non-neoplastic findings: unremarkable Lymph nodes: number examined 15; number positive: 0 Pathologic Staging: ypT1, ypN0, Mx Ancillary studies: per request   RADIOGRAPHIC STUDIES: I have personally reviewed the radiological images as listed and  agreed with the findings in the report. No results found. COLONOSCOPY 08/04/2015 Dr. Silverio Vang  COLON FINDINGS:  5 cm rectal mass starting at 10 cm from anal verge extending to 15 cm, friable ulcerated mucosa concerning for malignancy, multiple random biopsies were obtained. 3 mm polyp in sigmoid colon removed by cold biopsy forceps. Diverticulosis. Retroflexed views revealed internal hemorrhoids. The time to cecum = 0.7 Withdrawal time = 14.0 The scope was withdrawn and the procedure completed.  COMPLICATIONS: There were no immediate complications.  ENDOSCOPIC IMPRESSION: 5 cm rectal mass starting at 10 cm from anal verge extending to 15 cm, friable ulcerated mucosa concerning for malignancy, multiple random biopsies were obtained. 3 mm polyp in sigmoid colon removed by cold biopsy forceps. Diverticulosis RECOMMENDATIONS: 1. Await pathology results 2. CT chest, abdomen and pelvis with contrast Referral to surgery based on pathology and imaging results Repeat colonoscopy in 6-12 months eSigned: Harl Bowie, MD 08/04/15  EUS by Dr. Ardis Hughs  ENDOSCOPIC IMPRESSION: 08/19/2015 1. Non circumferential, 3cm long uT3N0 (Stage IIa) rectal adenocarcinoma with distal edge located 8cm from the anal verge. The lateral edges of the tumor were labeled with submucosal injection of SPOT following EUS examination. 2. The "asymmetric 3.2 x 2.6 cm soft tissue thickening involving the posterior right wall of the distal rectum at the anal verge (image 120) noted by CT scan was located. It is distinct from the malignant mass described above, was vaguely bordered and I suspect this is gynecologic in orgin.  RECOMMENDATIONS: She will likely benefit from neoadjuvant chemo/XRT. Prefer more information about the soft tissue described in #2 above and will order pelvic ultrasound. Will communicate these results with Dr. Lisbeth Renshaw, [facility] [place].   ASSESSMENT & PLAN:  60 year old Caucasian female, history of  superficial skin melanoma and basal cell carcinoma, otherwise healthy and fit, presented with rectal bleeding.   1. Rectal adenocarcinoma, cT3N0M0, ypT1N0M0 -I previously reviewed her colonoscopy finding and the surgical biopsy results in detail with her and her husband. -her EUS showed a T3N0 disease -She has completed new adjuvant chemotherapy and radiation, tolerated well overall. Her hematochezia resolved after the first few weeks of radiation. -Her Xeloda was held for last 7 days of radiation due to elevated liver enzymes -She has had excellent response to neoadjuvant chemotherapy and radiation, surgical path reviewed a T1 residual disease, lymph nodes were negative. -We discussed that standard therapy for T3 rectal cancer is adjuvant chemotherapy. However she is very reluctant to have chemotherapy, and she has had very good  response to neoadjuvant chemotherapy and radiation, the residual disease is T1, I think skipping adjuvant chemotherapy and start surveillance is also reasonable. -We discussed the risk of cancer recurrence after surgery. I recommend her to have close follow-up, I'll see her every 3-4 months with lab and exam for the first 2-3 years, then every 6 months for additional 2-3 years (total 5 years), and consider surveillance CT scan every 6-12 months for up to 5 years. -She would like to have a second opinion at at [facility], and has scheduled her appointment.  2. Transaminitis -Secondary to Xeloda. Resolved and now   Plan -No adjuvant chemotherapy -Return to clinic in 3 months for lab and follow-up  Pt had multiple questions, and I answered to her satisfaction. She knows to call us if she has concerns before your next appointment.  I spent 20 minutes counseling the patient face to face. The total time spent in the appointment was 25 minutes and more than 50% was on counseling.    Molly Vang  12/21/2015

## 2015-12-21 NOTE — Telephone Encounter (Signed)
per pfo to sch pt appt-per pt req to mail avs-mailed

## 2015-12-25 ENCOUNTER — Encounter: Payer: Self-pay | Admitting: Hematology

## 2015-12-27 ENCOUNTER — Encounter: Payer: Self-pay | Admitting: Hematology

## 2015-12-28 ENCOUNTER — Other Ambulatory Visit: Payer: Self-pay | Admitting: *Deleted

## 2015-12-28 ENCOUNTER — Telehealth: Payer: Self-pay | Admitting: *Deleted

## 2015-12-28 DIAGNOSIS — N390 Urinary tract infection, site not specified: Secondary | ICD-10-CM

## 2015-12-28 NOTE — Telephone Encounter (Signed)
Spoke with pt and informed pt re:  OK with Dr. Burr Medico for pt to have urine check due to UTI symptoms.   Pt stated she could come in tomorrow 12/29/15 for lab.  Gave pt appt at 10 am for lab.  Pt voiced understanding.

## 2015-12-29 ENCOUNTER — Other Ambulatory Visit (HOSPITAL_BASED_OUTPATIENT_CLINIC_OR_DEPARTMENT_OTHER): Payer: Federal, State, Local not specified - PPO

## 2015-12-29 DIAGNOSIS — N390 Urinary tract infection, site not specified: Secondary | ICD-10-CM

## 2015-12-29 LAB — URINALYSIS, MICROSCOPIC - CHCC
Bilirubin (Urine): NEGATIVE
Blood: NEGATIVE
GLUCOSE UR CHCC: NEGATIVE mg/dL
KETONES: NEGATIVE mg/dL
Leukocyte Esterase: NEGATIVE
Nitrite: NEGATIVE
PH: 6.5 (ref 4.6–8.0)
PROTEIN: NEGATIVE mg/dL
RBC / HPF: NEGATIVE (ref 0–2)
Specific Gravity, Urine: 1.005 (ref 1.003–1.035)
Urobilinogen, UR: 0.2 mg/dL (ref 0.2–1)

## 2015-12-30 ENCOUNTER — Telehealth: Payer: Self-pay | Admitting: Hematology

## 2015-12-30 NOTE — Telephone Encounter (Signed)
I called pt back regarding her UA from yesterday which was negative, her urine culture is still pending. She has had mild dysuria since her rectal surgery. She has spoken with Dr. Marcello Moores also. I will inform pt if her urine culture positive.   Truitt Merle  12/30/2015

## 2015-12-31 LAB — URINE CULTURE

## 2016-01-02 ENCOUNTER — Telehealth: Payer: Self-pay | Admitting: Hematology

## 2016-01-02 ENCOUNTER — Other Ambulatory Visit: Payer: Self-pay | Admitting: Hematology

## 2016-01-02 DIAGNOSIS — N390 Urinary tract infection, site not specified: Secondary | ICD-10-CM

## 2016-01-02 MED ORDER — CIPROFLOXACIN HCL 500 MG PO TABS: 500.0000 mg | ORAL_TABLET | Freq: Two times a day (BID) | ORAL | Status: DC

## 2016-01-02 NOTE — Telephone Encounter (Signed)
I called pt and left a message on her cell phone: her urine culture from 12/29/2015 came back to be positive for klebsiella, sensitive to most antibiotics. I called in cipro 500mg  bid X5 days to her pharmacy and asked her to call us back on Monday when she receives the message.  Truitt Merle  01/02/2016

## 2016-04-21 ENCOUNTER — Ambulatory Visit (HOSPITAL_BASED_OUTPATIENT_CLINIC_OR_DEPARTMENT_OTHER): Payer: Federal, State, Local not specified - PPO

## 2016-04-21 ENCOUNTER — Telehealth: Payer: Self-pay | Admitting: Hematology

## 2016-04-21 DIAGNOSIS — C20 Malignant neoplasm of rectum: Secondary | ICD-10-CM | POA: Diagnosis not present

## 2016-04-21 LAB — CBC WITH DIFFERENTIAL/PLATELET
BASO%: 1.3 % (ref 0.0–2.0)
BASOS ABS: 0.1 10*3/uL (ref 0.0–0.1)
EOS ABS: 0.2 10*3/uL (ref 0.0–0.5)
EOS%: 3.2 % (ref 0.0–7.0)
HCT: 39.2 % (ref 34.8–46.6)
HEMOGLOBIN: 13.2 g/dL (ref 11.6–15.9)
LYMPH%: 12.6 % — ABNORMAL LOW (ref 14.0–49.7)
MCH: 29.3 pg (ref 25.1–34.0)
MCHC: 33.7 g/dL (ref 31.5–36.0)
MCV: 87.1 fL (ref 79.5–101.0)
MONO#: 0.5 10*3/uL (ref 0.1–0.9)
MONO%: 8.9 % (ref 0.0–14.0)
NEUT%: 74 % (ref 38.4–76.8)
NEUTROS ABS: 4 10*3/uL (ref 1.5–6.5)
PLATELETS: 265 10*3/uL (ref 145–400)
RBC: 4.5 10*6/uL (ref 3.70–5.45)
RDW: 14.3 % (ref 11.2–14.5)
WBC: 5.4 10*3/uL (ref 3.9–10.3)
lymph#: 0.7 10*3/uL — ABNORMAL LOW (ref 0.9–3.3)

## 2016-04-21 LAB — COMPREHENSIVE METABOLIC PANEL
ALBUMIN: 4.1 g/dL (ref 3.5–5.0)
ALK PHOS: 103 U/L (ref 40–150)
ALT: 19 U/L (ref 0–55)
ANION GAP: 10 meq/L (ref 3–11)
AST: 29 U/L (ref 5–34)
BILIRUBIN TOTAL: 0.56 mg/dL (ref 0.20–1.20)
BUN: 15.7 mg/dL (ref 7.0–26.0)
CALCIUM: 9.6 mg/dL (ref 8.4–10.4)
CO2: 25 mEq/L (ref 22–29)
Chloride: 105 mEq/L (ref 98–109)
Creatinine: 0.8 mg/dL (ref 0.6–1.1)
EGFR: 78 mL/min/{1.73_m2} — AB (ref >90)
Glucose: 112 mg/dl (ref 70–140)
POTASSIUM: 4.1 meq/L (ref 3.5–5.1)
Sodium: 140 mEq/L (ref 136–145)
TOTAL PROTEIN: 7.2 g/dL (ref 6.4–8.3)

## 2016-04-21 NOTE — Telephone Encounter (Signed)
Patient came in to r/s December appointments. Gave patient appointments for lab 12/11 and YF 12/15 @ 1 pm. Patient declined printout.

## 2016-04-24 ENCOUNTER — Other Ambulatory Visit: Payer: Self-pay | Admitting: *Deleted

## 2016-04-24 DIAGNOSIS — C20 Malignant neoplasm of rectum: Secondary | ICD-10-CM

## 2016-04-24 LAB — CEA (IN HOUSE-CHCC): CEA (CHCC-IN HOUSE): 2.41 ng/mL (ref 0.00–5.00)

## 2016-04-25 LAB — CEA (PARALLEL TESTING): CEA: 1 ng/mL

## 2016-06-30 ENCOUNTER — Encounter: Payer: Self-pay | Admitting: Gastroenterology

## 2016-07-03 ENCOUNTER — Encounter: Payer: Self-pay | Admitting: Gastroenterology

## 2016-08-02 DIAGNOSIS — L570 Actinic keratosis: Secondary | ICD-10-CM | POA: Diagnosis not present

## 2016-08-02 DIAGNOSIS — D225 Melanocytic nevi of trunk: Secondary | ICD-10-CM | POA: Diagnosis not present

## 2016-08-02 DIAGNOSIS — D2261 Melanocytic nevi of right upper limb, including shoulder: Secondary | ICD-10-CM | POA: Diagnosis not present

## 2016-08-02 DIAGNOSIS — D2271 Melanocytic nevi of right lower limb, including hip: Secondary | ICD-10-CM | POA: Diagnosis not present

## 2016-08-02 DIAGNOSIS — L821 Other seborrheic keratosis: Secondary | ICD-10-CM | POA: Diagnosis not present

## 2016-08-07 ENCOUNTER — Ambulatory Visit (AMBULATORY_SURGERY_CENTER): Payer: Self-pay

## 2016-08-07 ENCOUNTER — Other Ambulatory Visit (HOSPITAL_BASED_OUTPATIENT_CLINIC_OR_DEPARTMENT_OTHER): Payer: Federal, State, Local not specified - PPO

## 2016-08-07 ENCOUNTER — Telehealth: Payer: Self-pay

## 2016-08-07 VITALS — Ht 67.0 in | Wt 136.8 lb

## 2016-08-07 DIAGNOSIS — C20 Malignant neoplasm of rectum: Secondary | ICD-10-CM

## 2016-08-07 LAB — CBC WITH DIFFERENTIAL/PLATELET
BASO%: 1.9 % (ref 0.0–2.0)
Basophils Absolute: 0.1 10*3/uL (ref 0.0–0.1)
EOS%: 3.4 % (ref 0.0–7.0)
Eosinophils Absolute: 0.2 10*3/uL (ref 0.0–0.5)
HCT: 41.4 % (ref 34.8–46.6)
HGB: 13.6 g/dL (ref 11.6–15.9)
LYMPH%: 14 % (ref 14.0–49.7)
MCH: 29.4 pg (ref 25.1–34.0)
MCHC: 32.8 g/dL (ref 31.5–36.0)
MCV: 89.6 fL (ref 79.5–101.0)
MONO#: 0.4 10*3/uL (ref 0.1–0.9)
MONO%: 8.6 % (ref 0.0–14.0)
NEUT%: 72.1 % (ref 38.4–76.8)
NEUTROS ABS: 3.5 10*3/uL (ref 1.5–6.5)
PLATELETS: 291 10*3/uL (ref 145–400)
RBC: 4.63 10*6/uL (ref 3.70–5.45)
RDW: 13.1 % (ref 11.2–14.5)
WBC: 4.8 10*3/uL (ref 3.9–10.3)
lymph#: 0.7 10*3/uL — ABNORMAL LOW (ref 0.9–3.3)

## 2016-08-07 LAB — COMPREHENSIVE METABOLIC PANEL
ALT: 21 U/L (ref 0–55)
ANION GAP: 9 meq/L (ref 3–11)
AST: 26 U/L (ref 5–34)
Albumin: 4.2 g/dL (ref 3.5–5.0)
Alkaline Phosphatase: 103 U/L (ref 40–150)
BILIRUBIN TOTAL: 0.59 mg/dL (ref 0.20–1.20)
BUN: 10.7 mg/dL (ref 7.0–26.0)
CHLORIDE: 105 meq/L (ref 98–109)
CO2: 25 meq/L (ref 22–29)
CREATININE: 0.7 mg/dL (ref 0.6–1.1)
Calcium: 9.1 mg/dL (ref 8.4–10.4)
EGFR: 87 mL/min/{1.73_m2} — ABNORMAL LOW (ref >90)
GLUCOSE: 94 mg/dL (ref 70–140)
Potassium: 3.8 mEq/L (ref 3.5–5.1)
SODIUM: 139 meq/L (ref 136–145)
TOTAL PROTEIN: 7.4 g/dL (ref 6.4–8.3)

## 2016-08-07 NOTE — Telephone Encounter (Signed)
Molly Vang,   Please review notes from colectomy for cancer.  Pt voiced at previsit that there was an adverse reaction.  MD and anesthesia disputed the cause.  Crepitus of face, neck, arms.  Pt recalls face being lower than body so she is trying to attribute it to gravity.  Thanks for double-checking OK for [facility]?                                                                                                                                                                                   Levada Dy

## 2016-08-07 NOTE — Progress Notes (Signed)
No allergies to eggs or soy  No diet meds No home oxygen  Declined emmi  Possibly huge problem with crepitus following surgery/anesthesia.  MD and anesthesia dispute exact cause.  MD says anesthesia and anesthesia says surgery.

## 2016-08-10 NOTE — Telephone Encounter (Signed)
noted 

## 2016-08-10 NOTE — Telephone Encounter (Signed)
Angela,  This pt is cleared for anesthetic care at [facility].  Thanks,  Alandis Bluemel 

## 2016-08-11 ENCOUNTER — Other Ambulatory Visit: Payer: Federal, State, Local not specified - PPO

## 2016-08-11 ENCOUNTER — Ambulatory Visit (HOSPITAL_BASED_OUTPATIENT_CLINIC_OR_DEPARTMENT_OTHER): Payer: Federal, State, Local not specified - PPO | Admitting: Hematology

## 2016-08-11 VITALS — BP 126/77 | HR 74 | Temp 97.9°F | Resp 18 | Ht 67.0 in | Wt 137.0 lb

## 2016-08-11 DIAGNOSIS — C20 Malignant neoplasm of rectum: Secondary | ICD-10-CM | POA: Diagnosis not present

## 2016-08-11 NOTE — Progress Notes (Signed)
[place]  Telephone:([phone]) [phone] Fax:(336) [phone]  Clinic follow up Note   Patient Care Team: Arvella Nigh, MD as PCP - General (Obstetrics and Gynecology) [name], MD as Consulting Physician (General Surgery) Kyung Rudd, MD as Consulting Physician (Radiation Oncology) Mauri Pole, MD as Consulting Physician (Gastroenterology)   CHIEF COMPLAINTS:  Follow up rectal cancer   Oncology History   Rectal adenocarcinoma [facility])   Staging form: Colon and Rectum, AJCC 7th Edition     Clinical stage from 08/04/2015: Stage IIA (T3, N0, M0) - Signed by Truitt Merle, MD on 08/31/2015       Rectal adenocarcinoma ([place])   08/04/2015 Initial Biopsy    rectal mass adenocarcinoma, descending colon polyp hyperplastic polyp.       08/04/2015 Procedure    5 cm rectal mass starting at 10 cm from anal verge extending to 15 cm, 95mm sigmoid polyp was removed       08/06/2015 Imaging    CT chest, abdomen and pelvis w contrast showed a possible area of rectal wall thickening aterior to the distal sacrum, no adenopathy or distant metastasis       08/13/2015 Initial Diagnosis    Rectal adenocarcinoma ([place])      08/19/2015 Procedure    low EUS showed a T3N0 rectal mass       08/31/2015 -  Radiation Therapy    radiation to rectal adenocarcinoma       08/31/2015 - 10/08/2015 Chemotherapy    Capecitabine 1500mg  q12h with concurrent radiation, held after 09/29/2015 due to transaminitis       11/26/2015 Surgery    Rectosigmoid segmental resection, surgical margins were negative.      11/26/2015 Pathology Results    Rectosigmoid segmental resection showed invasive colonic adenocarcinoma, grade 2, T1, margins were negative, 15 lymph nodes were negative. LVI (-), PNI (-)       HISTORY OF PRESENTING ILLNESS (08/13/2015):  Molly Vang 60 y.o. female is here because of newly diagnosed rectal adenocarcinoma. She is accompanied by her husband to our multidisciplinary GI clinic  today.  She had a colonoscopy in 05/2013 by Dr. Olevia Perches which showed a 3-74mm sessile rectal polyp, which was removed, pathology revealed tubular adenoma. She has had intermittent rectal bleeding since Oct 2016, mild, she had mild constipation, no pain, nause or change of apeptie, no weihgt loss. She called Dr. Nichola Sizer office, who has recently retired, and was seen by Dr. Silverio Decamp. She underwent colonoscopy on 08/04/2015, which showed a 5 cm rectal mass starting at a 10 cm from an approach extending to 15 cm, and a 3 mm sigmoid polyp which was removed. The rectal mass biopsy showed adenocarcinoma. CT of the chest abdomen and pelvis with contrast was performed on 08/06/2015, which showed no adenopathy or distant metastasis.  She otherwise feels well, no other complains. She is a homemaker, also works as a English as a second language teacher. She eats healthy diet, and walks 5 miles a day, very physically active.   She had a early stage skin melanoma on her right arm, was removed a few years ago. She also has a basal cell skin cancer on her face, will need a resection soon. She otherwise is very healthy. No family history of colon cancer or other malignancy.   CURRENT THERAPY: Surveillance  INTERIM HISTORY [name] returns for follow up. She is doing very well overall, denies andy pain, or other discomfort. She has good appetite, has been back to her normal exercise and activities. Her bowel  movements are normal, she denies melena or hematochezia. Her weight is stable,   MEDICAL HISTORY:  Past Medical History:  Diagnosis Date  . Cancer ([place])    melanoma right bicep removed  . Crepitus of cervical spine    face, neck, arms; MD and anesthesia dispute the cause  . Rectal cancer ([place])   . SVT (supraventricular tachycardia) ([place]) 06/11/2013   svt on 10/6// ablation on 10/15    SURGICAL HISTORY: Past Surgical History:  Procedure Laterality Date  . COLONOSCOPY    . EUS N/A 08/19/2015   Procedure: LOWER ENDOSCOPIC  ULTRASOUND (EUS);  Surgeon: Milus Banister, MD;  Location: [facility];  Service: Endoscopy;  Laterality: N/A;  . NM MYOCAR PERF WALL MOTION  12/14/2010   normal  . OVARIAN CYST SURGERY  1993  . SUPRAVENTRICULAR TACHYCARDIA ABLATION  06/11/2013  . SUPRAVENTRICULAR TACHYCARDIA ABLATION N/A 06/11/2013   Procedure: SUPRAVENTRICULAR TACHYCARDIA ABLATION;  Surgeon: Evans Lance, MD;  Location: [facility] CATH LAB;  Service: Cardiovascular;  Laterality: N/A;  . TONSILLECTOMY  1960  . US ECHOCARDIOGRAPHY  11/14/11   trace TR,MR  . XI ROBOTIC ASSISTED LOWER ANTERIOR RESECTION N/A 11/26/2015   Procedure: XI ROBOTIC ASSISTED LOWER ANTERIOR RESECTION WITH RIGID PROCTOSCOPY;  Surgeon: [name], MD;  Location: [facility];  Service: General;  Laterality: N/A;    SOCIAL HISTORY: Social History   Social History  . Marital Status: Married    Spouse Name: N/A  . Number of Children: 2   . Years of Education: N/A   Occupational History  . She is a Materials engineer    Social History Main Topics  . Smoking status: Never Smoker   . Smokeless tobacco: Never Used  . Alcohol Use: Yes     Comment: 06/11/2013 "beer or glass of wine maybe once/month"  . Drug Use: No  . Sexual Activity: Yes   Other Topics Concern  . Not on file   Social History Narrative    FAMILY HISTORY: Family History  Problem Relation Age of Onset  . Hypertension Father   . Hyperlipidemia Father   . Hypertension Mother   . Hyperlipidemia Brother   . Colon cancer Neg Hx   . Stomach cancer Neg Hx   . Esophageal cancer Neg Hx   . Rectal cancer Neg Hx     ALLERGIES:  has No Known Allergies.  MEDICATIONS:  Current Outpatient Prescriptions  Medication Sig Dispense Refill  . aspirin EC 81 MG tablet Take 81 mg by mouth daily. Reported on 10/08/2015    . Bisacodyl (DULCOLAX PO) Take by mouth.    . Calcium Carbonate-Vitamin D (CALCIUM 600 + D PO) Take 1 tablet by mouth daily. Vit. D3 is 400iu    . chlorhexidine (PERIDEX) 0.12 % solution  Use as directed 15 mLs in the mouth or throat daily.    . chlorpheniramine (CHLOR-TRIMETON) 4 MG tablet Take 4 mg by mouth 2 (two) times daily. Reported on 09/03/2015    . fish oil-omega-3 fatty acids 1000 MG capsule Take 1 g by mouth daily. EPA 570mg   And DHA 130mg     . Multiple Vitamins-Minerals (MULTIVITAMIN WOMEN 50+ PO) Take 1 tablet by mouth daily.    . naproxen sodium (ANAPROX) 220 MG tablet Take 220 mg by mouth daily as needed (for pain). Reported on 09/28/2015    . Polyethyl Glycol-Propyl Glycol (SYSTANE) 0.4-0.3 % SOLN Apply 1 drop to eye every evening.    . polyethylene glycol powder (MIRALAX) powder Take 1 Container by  mouth once.    Marland Kitchen zolpidem (AMBIEN) 10 MG tablet Take 5 mg by mouth at bedtime as needed for sleep.   2   No current facility-administered medications for this visit.     REVIEW OF SYSTEMS:   Constitutional: Denies fevers, chills or abnormal night sweats Eyes: Denies blurriness of vision, double vision or watery eyes Ears, nose, mouth, throat, and face: Denies mucositis or sore throat Respiratory: Denies cough, dyspnea or wheezes Cardiovascular: Denies palpitation, chest discomfort or lower extremity swelling Gastrointestinal:  Denies nausea, heartburn or change in bowel habits Skin: Denies abnormal skin rashes Lymphatics: Denies new lymphadenopathy or easy bruising Neurological:Denies numbness, tingling or new weaknesses Behavioral/Psych: Mood is stable, no new changes  All other systems were reviewed with the patient and are negative.  PHYSICAL EXAMINATION: ECOG PERFORMANCE STATUS: 0 - Asymptomatic  Vitals:   08/11/16 1325  BP: 126/77  Pulse: 74  Resp: 18  Temp: 97.9 F (36.6 C)   Filed Weights   08/11/16 1325  Weight: 137 lb (62.1 kg)    GENERAL:alert, no distress and comfortable SKIN: skin color, texture, turgor are normal, no rashes or significant lesions EYES: normal, conjunctiva are pink and non-injected, sclera clear OROPHARYNX:no exudate, no  erythema and lips, buccal mucosa, and tongue normal  NECK: supple, thyroid normal size, non-tender, without nodularity LYMPH:  no palpable lymphadenopathy in the cervical, axillary or inguinal LUNGS: clear to auscultation and percussion with normal breathing effort HEART: regular rate & rhythm and no murmurs and no lower extremity edema ABDOMEN:abdomen soft, non-tender and normal bowel sounds. Laparoscopic surgical incisions are well-healed. Rectal exam was deferred, she is going to have a colonoscopy next week. Musculoskeletal:no cyanosis of digits and no clubbing  PSYCH: alert & oriented x 3 with fluent speech NEURO: no focal motor/sensory deficits  LABORATORY DATA:  I have reviewed the data as listed CBC Latest Ref Rng & Units 08/07/2016 04/21/2016 12/21/2015  WBC 3.9 - 10.3 10e3/uL 4.8 5.4 7.2  Hemoglobin 11.6 - 15.9 g/dL 13.6 13.2 13.2  Hematocrit 34.8 - 46.6 % 41.4 39.2 40.7  Platelets 145 - 400 10e3/uL 291 265 470(H)    CMP Latest Ref Rng & Units 08/07/2016 04/21/2016 12/21/2015  Glucose 70 - 140 mg/dl 94 112 94  BUN 7.0 - 26.0 mg/dL 10.7 15.7 13.1  Creatinine 0.6 - 1.1 mg/dL 0.7 0.8 0.8  Sodium 136 - 145 mEq/L 139 140 140  Potassium 3.5 - 5.1 mEq/L 3.8 4.1 3.9  Chloride 101 - 111 mmol/L - - -  CO2 22 - 29 mEq/L 25 25 28   Calcium 8.4 - 10.4 mg/dL 9.1 9.6 9.6  Total Protein 6.4 - 8.3 g/dL 7.4 7.2 7.5  Total Bilirubin 0.20 - 1.20 mg/dL 0.59 0.56 0.45  Alkaline Phos 40 - 150 U/L 103 103 125  AST 5 - 34 U/L 26 29 28   ALT 0 - 55 U/L 21 19 29     PATHOLOGY REPORT: Diagnosis 11/26/2015 1. Colon, segmental resection for tumor, rectosigmoid INVASIVE COLONIC ADENOCARCINOMA, GRADE 2 THE TUMOR INVADES SUBMUCOSA MARGINS OF RESECTION ARE NEGATIVE FOR TUMOR FIFTEEN BENIGN LYMPH NODES (0/15) 2. Colon, resection margin (donut), final distal BENIGN COLONIC TISSUE NEGATIVE FOR MALIGNANCY  Microscopic Comment 1. COLON AND RECTUM (INCLUDING TRANS-ANAL RESECTION): Specimen: Rectosigmoid  colon Procedure: Segmental resection Tumor site: rectum Specimen integrity: Intact Macroscopic intactness of mesorectum: Not applicable: x Complete: NA Near complete: NA Incomplete: NA Cannot be determined (specify): NA Macroscopic tumor perforation: Submucosa Invasive tumor: Maximum size: 0.4 cm Histologic type(s): Adenocarcinoma  Histologic grade and differentiation: G2 G1: well differentiated/low grade G2: moderately differentiated/low grade G3: poorly differentiated/high grade G4: undifferentiated/high grade Type of polyp in which invasive carcinoma arose: Tubular adenoma Microscopic extension of invasive tumor: Submucosa Lymph-Vascular invasion: Negative Peri-neural invasion: Negative Tumor deposit(s) (discontinuous extramural extension): Negative Resection margins: Proximal margin: 7.1 cm Distal margin: 1.3 Circumferential (radial) (posterior ascending, posterior descending; lateral and posterior mid-rectum; and entire lower 1/3 rectum): 3.5 cm Mesenteric margin (sigmoid and transverse): NA Distance closest margin (if all above margins negative): 1.7 cm Trans-anal resection margins only: Deep margin: NA Mucosal Margin: NA Distance closest mucosal margin (if negative): NA Treatment effect (neo-adjuvant therapy): Present Additional polyp(s): Negative Non-neoplastic findings: unremarkable Lymph nodes: number examined 15; number positive: 0 Pathologic Staging: ypT1, ypN0, Mx Ancillary studies: per request   RADIOGRAPHIC STUDIES: I have personally reviewed the radiological images as listed and agreed with the findings in the report. No results found.   COLONOSCOPY 08/04/2015 Dr. Silverio Decamp  COLON FINDINGS:  5 cm rectal mass starting at 10 cm from anal verge extending to 15 cm, friable ulcerated mucosa concerning for malignancy, multiple random biopsies were obtained. 3 mm polyp in sigmoid colon removed by cold biopsy forceps. Diverticulosis. Retroflexed views revealed  internal hemorrhoids. The time to cecum = 0.7 Withdrawal time = 14.0 The scope was withdrawn and the procedure completed.  COMPLICATIONS: There were no immediate complications.  ENDOSCOPIC IMPRESSION: 5 cm rectal mass starting at 10 cm from anal verge extending to 15 cm, friable ulcerated mucosa concerning for malignancy, multiple random biopsies were obtained. 3 mm polyp in sigmoid colon removed by cold biopsy forceps. Diverticulosis RECOMMENDATIONS: 1. Await pathology results 2. CT chest, abdomen and pelvis with contrast Referral to surgery based on pathology and imaging results Repeat colonoscopy in 6-12 months eSigned: Harl Bowie, MD 08/04/15  EUS by Dr. Ardis Hughs  ENDOSCOPIC IMPRESSION: 08/19/2015 1. Non circumferential, 3cm long uT3N0 (Stage IIa) rectal adenocarcinoma with distal edge located 8cm from the anal verge. The lateral edges of the tumor were labeled with submucosal injection of SPOT following EUS examination. 2. The "asymmetric 3.2 x 2.6 cm soft tissue thickening involving the posterior right wall of the distal rectum at the anal verge (image 120) noted by CT scan was located. It is distinct from the malignant mass described above, was vaguely bordered and I suspect this is gynecologic in orgin.  RECOMMENDATIONS: She will likely benefit from neoadjuvant chemo/XRT. Prefer more information about the soft tissue described in #2 above and will order pelvic ultrasound. Will communicate these results with Dr. Lisbeth Renshaw, [facility].   ASSESSMENT & PLAN:  60 year old Caucasian female, history of superficial skin melanoma and basal cell carcinoma, otherwise healthy and fit, presented with rectal bleeding.   1. Rectal adenocarcinoma, cT3N0M0, ypT1N0M0 -I previously reviewed her colonoscopy finding and the surgical biopsy results in detail with her and her husband. -her EUS showed a T3N0 disease -She has completed neoadjuvant chemotherapy and radiation, tolerated well  overall. Her hematochezia resolved after the first few weeks of radiation. -Her Xeloda was held for last 7 days of radiation due to elevated liver enzymes -She has had excellent response to neoadjuvant chemotherapy and radiation, surgical path reviewed a T1 residual disease, lymph nodes were negative. -We discussed that standard therapy for T3 rectal cancer is adjuvant chemotherapy. However she is very reluctant to have chemotherapy, and she has had very good response to neoadjuvant chemotherapy and radiation, the residual disease is T1, I think skipping adjuvant chemotherapy and start surveillance  is also reasonable. -She is clinically doing well, exam was unremarkable, lab results reviewed, her CBC and CMP are normal. No clinical concern for recurrence. -Continue surveillance. She will see Dr. Marcello Moores and me alternatively every 4-6 months, for a total of 5 years. She is scheduled for colonoscopy next week. I recommend her to have a surveillance CT scan next month.   Plan -Surveillance colonoscopy next week -Surveillance lab CEA and CT chest, abdomen and pelvis with contrast, she'll call me after her CT scan to discuss the results -She will see Dr. Marcello Moores in 4 months, I plan to see her back in a month with lab 1 week before.   Pt had multiple questions, and I answered to her satisfaction. She knows to call us if she has concerns before your next appointment.  I spent 15 minutes counseling the patient face to face. The total time spent in the appointment was 20 minutes and more than 50% was on counseling.    Truitt Merle  08/11/2016

## 2016-08-13 ENCOUNTER — Encounter: Payer: Self-pay | Admitting: Hematology

## 2016-08-18 ENCOUNTER — Ambulatory Visit: Payer: Federal, State, Local not specified - PPO | Admitting: Hematology

## 2016-08-18 ENCOUNTER — Other Ambulatory Visit: Payer: Federal, State, Local not specified - PPO

## 2016-08-18 ENCOUNTER — Telehealth: Payer: Self-pay | Admitting: Hematology

## 2016-08-18 NOTE — Telephone Encounter (Signed)
Left message re August appointments. Central radiology will call re scan. Schedule mailed.

## 2016-08-24 ENCOUNTER — Encounter: Payer: Self-pay | Admitting: Gastroenterology

## 2016-08-24 ENCOUNTER — Ambulatory Visit (AMBULATORY_SURGERY_CENTER): Payer: Federal, State, Local not specified - PPO | Admitting: Gastroenterology

## 2016-08-24 VITALS — BP 104/70 | HR 71 | Temp 97.3°F | Resp 8 | Ht 67.0 in | Wt 137.0 lb

## 2016-08-24 DIAGNOSIS — Z85048 Personal history of other malignant neoplasm of rectum, rectosigmoid junction, and anus: Secondary | ICD-10-CM

## 2016-08-24 DIAGNOSIS — D125 Benign neoplasm of sigmoid colon: Secondary | ICD-10-CM

## 2016-08-24 DIAGNOSIS — C20 Malignant neoplasm of rectum: Secondary | ICD-10-CM | POA: Diagnosis not present

## 2016-08-24 MED ORDER — SODIUM CHLORIDE 0.9 % IV SOLN
500.0000 mL | INTRAVENOUS | Status: DC
Start: 2016-08-24 — End: 2017-08-24

## 2016-08-24 NOTE — Progress Notes (Signed)
A/ox3 pleased with MAC, report to Jane RN 

## 2016-08-24 NOTE — Op Note (Signed)
[place] Patient Name: Molly Vang Procedure Date: 08/24/2016 9:37 AM MRN: [id number] Endoscopist: Mauri Pole , MD Age: 60 Referring MD:  Date of Birth: 28-Nov-1955 Gender: Female Account #: [id number] Procedure:                Colonoscopy Indications:              High risk colon cancer surveillance: Personal                            history of colon cancer (Rectal cancer T3 N0) Medicines:                Monitored Anesthesia Care Procedure:                Pre-Anesthesia Assessment:                           - Prior to the procedure, a History and Physical                            was performed, and patient medications and                            allergies were reviewed. The patient's tolerance of                            previous anesthesia was also reviewed. The risks                            and benefits of the procedure and the sedation                            options and risks were discussed with the patient.                            All questions were answered, and informed consent                            was obtained. Prior Anticoagulants: The patient has                            taken no previous anticoagulant or antiplatelet                            agents. ASA Grade Assessment: III - A patient with                            severe systemic disease. After reviewing the risks                            and benefits, the patient was deemed in                            satisfactory condition to undergo the procedure.  After obtaining informed consent, the colonoscope                            was passed under direct vision. Throughout the                            procedure, the patient's blood pressure, pulse, and                            oxygen saturations were monitored continuously. The                            Model PCF-H190L [id number]) scope was introduced                            through  the anus and advanced to the the terminal                            ileum, with identification of the appendiceal                            orifice and IC valve. The colonoscopy was performed                            without difficulty. The patient tolerated the                            procedure well. The quality of the bowel                            preparation was excellent. The terminal ileum,                            ileocecal valve, appendiceal orifice, and rectum                            were photographed. Scope In: 9:50:39 AM Scope Out: 10:14:38 AM Scope Withdrawal Time: 0 hours 13 minutes 30 seconds  Total Procedure Duration: 0 hours 23 minutes 59 seconds  Findings:                 The perianal and digital rectal examinations were                            normal.                           A few small and large-mouthed diverticula were                            found in the sigmoid colon.                           A 1 mm polyp was found in the sigmoid colon. The  polyp was sessile. The polyp was removed with a                            cold biopsy forceps. Resection and retrieval were                            complete.                           Non-bleeding internal hemorrhoids were found during                            retroflexion. The hemorrhoids were medium-sized.                            Anastomosis site appeared normal                           Anal papilla(e) were hypertrophied.                           The exam was otherwise without abnormality. Complications:            No immediate complications. Estimated Blood Loss:     Estimated blood loss: none. Impression:               - Diverticulosis in the sigmoid colon.                           - One 1 mm polyp in the sigmoid colon, removed with                            a cold biopsy forceps. Resected and retrieved.                           - Non-bleeding internal  hemorrhoids.                           - Anal papilla(e) were hypertrophied.                           - The examination was otherwise normal. Recommendation:           - Patient has a contact number available for                            emergencies. The signs and symptoms of potential                            delayed complications were discussed with the                            patient. Return to normal activities tomorrow.                            Written discharge instructions were provided to the  patient.                           - Resume previous diet.                           - Continue present medications.                           - Await pathology results.                           - Repeat colonoscopy in 3 years for surveillance                            and repeat flexible sigmoidoscopy in 1 year for                            surveillance Mauri Pole, MD 08/24/2016 10:23:19 AM This report has been signed electronically.

## 2016-08-24 NOTE — Patient Instructions (Signed)
YOU HAD AN ENDOSCOPIC PROCEDURE TODAY AT [facility]:   Refer to the procedure report that was given to you for any specific questions about what was found during the examination.  If the procedure report does not answer your questions, please call your gastroenterologist to clarify.  If you requested that your care partner not be given the details of your procedure findings, then the procedure report has been included in a sealed envelope for you to review at your convenience later.  YOU SHOULD EXPECT: Some feelings of bloating in the abdomen. Passage of more gas than usual.  Walking can help get rid of the air that was put into your GI tract during the procedure and reduce the bloating. If you had a lower endoscopy (such as a colonoscopy or flexible sigmoidoscopy) you may notice spotting of blood in your stool or on the toilet paper. If you underwent a bowel prep for your procedure, you may not have a normal bowel movement for a few days.  Please Note:  You might notice some irritation and congestion in your nose or some drainage.  This is from the oxygen used during your procedure.  There is no need for concern and it should clear up in a day or so.  SYMPTOMS TO REPORT IMMEDIATELY:   Following lower endoscopy (colonoscopy or flexible sigmoidoscopy):  Excessive amounts of blood in the stool  Significant tenderness or worsening of abdominal pains  Swelling of the abdomen that is new, acute  Fever of 100F or higher   Following upper endoscopy (EGD)  Vomiting of blood or coffee ground material  New chest pain or pain under the shoulder blades  Painful or persistently difficult swallowing  New shortness of breath  Fever of 100F or higher  Black, tarry-looking stools  For urgent or emergent issues, a gastroenterologist can be reached at any hour by calling [phone].   DIET:  We do recommend a small meal at first, but then you may proceed to your regular diet.  Drink  plenty of fluids but you should avoid alcoholic beverages for 24 hours.  ACTIVITY:  You should plan to take it easy for the rest of today and you should NOT DRIVE or use heavy machinery until tomorrow (because of the sedation medicines used during the test).    FOLLOW UP: Our staff will call the number listed on your records the next business day following your procedure to check on you and address any questions or concerns that you may have regarding the information given to you following your procedure. If we do not reach you, we will leave a message.  However, if you are feeling well and you are not experiencing any problems, there is no need to return our call.  We will assume that you have returned to your regular daily activities without incident.  If any biopsies were taken you will be contacted by phone or by letter within the next 1-3 weeks.  Please call us at [phone] if you have not heard about the biopsies in 3 weeks.    SIGNATURES/CONFIDENTIALITY: You and/or your care partner have signed paperwork which will be entered into your electronic medical record.  These signatures attest to the fact that that the information above on your After Visit Summary has been reviewed and is understood.  Full responsibility of the confidentiality of this discharge information lies with you and/or your care-partner.  Diverticulosis, hemorrhoid, and polyp information given. FLEXIBLE SIGMOIDOSCOPY IN ONE YEAR.  2018 COLONOSCOPY IN 3 YEARS.2020

## 2016-08-24 NOTE — Progress Notes (Signed)
Called to room to assist during endoscopic procedure.  Patient ID and intended procedure confirmed with present staff. Received instructions for my participation in the procedure from the performing physician.  

## 2016-08-25 ENCOUNTER — Telehealth: Payer: Self-pay | Admitting: *Deleted

## 2016-08-25 NOTE — Telephone Encounter (Signed)
  Follow up Call-  Call back number 08/24/2016 08/04/2015  Post procedure Call Back phone  # [phone] hm  Permission to leave phone message Yes Yes  Some recent data might be hidden     Patient questions:  Do you have a fever, pain , or abdominal swelling? No. Pain Score  0 *  Have you tolerated food without any problems? Yes.    Have you been able to return to your normal activities? Yes.    Do you have any questions about your discharge instructions: Diet   No. Medications  No. Follow up visit  No.  Do you have questions or concerns about your Care? No.  Actions: * If pain score is 4 or above: No action needed, pain <4.

## 2016-08-28 HISTORY — PX: COLONOSCOPY: SHX174

## 2016-08-31 ENCOUNTER — Telehealth: Payer: Self-pay | Admitting: Gastroenterology

## 2016-08-31 DIAGNOSIS — Z1231 Encounter for screening mammogram for malignant neoplasm of breast: Secondary | ICD-10-CM | POA: Diagnosis not present

## 2016-08-31 NOTE — Telephone Encounter (Signed)
Patient advised her biopsy is received and there is no mention of dysplasia or malignancy. She will wait for further recommendations from her doctor.

## 2016-08-31 NOTE — Telephone Encounter (Signed)
Spoke with the patient. No results are posted yet, but I will continue to monitor for results. She thanked me for my call.

## 2016-09-05 ENCOUNTER — Encounter: Payer: Self-pay | Admitting: Gastroenterology

## 2016-09-07 ENCOUNTER — Other Ambulatory Visit: Payer: Self-pay

## 2016-09-07 DIAGNOSIS — Z85038 Personal history of other malignant neoplasm of large intestine: Secondary | ICD-10-CM

## 2016-09-11 ENCOUNTER — Ambulatory Visit (HOSPITAL_COMMUNITY)
Admission: RE | Admit: 2016-09-11 | Discharge: 2016-09-11 | Disposition: A | Discharge status: Home / Self Care | Payer: Federal, State, Local not specified - PPO | Source: Ambulatory Visit | Attending: Hematology | Admitting: Hematology

## 2016-09-11 ENCOUNTER — Encounter (HOSPITAL_COMMUNITY): Payer: Self-pay

## 2016-09-11 ENCOUNTER — Other Ambulatory Visit: Payer: Self-pay

## 2016-09-11 DIAGNOSIS — Z9889 Other specified postprocedural states: Secondary | ICD-10-CM | POA: Insufficient documentation

## 2016-09-11 DIAGNOSIS — N289 Disorder of kidney and ureter, unspecified: Secondary | ICD-10-CM | POA: Insufficient documentation

## 2016-09-11 DIAGNOSIS — C20 Malignant neoplasm of rectum: Secondary | ICD-10-CM

## 2016-09-11 MED ORDER — IOPAMIDOL (ISOVUE-300) INJECTION 61%
100.0000 mL | Freq: Once | INTRAVENOUS | Status: AC | PRN
  Administered 2016-09-11: 100 mL via INTRAVENOUS

## 2016-09-14 ENCOUNTER — Telehealth: Payer: Self-pay | Admitting: *Deleted

## 2016-09-14 ENCOUNTER — Encounter: Payer: Self-pay | Admitting: Hematology

## 2016-09-14 NOTE — Telephone Encounter (Signed)
Notified pt that CT good & no recurrence per Dr Ernestina Penna request.  Pt expressed appreciation for call.

## 2016-09-14 NOTE — Telephone Encounter (Signed)
-----   Message from Truitt Merle, MD sent at 09/13/2016  5:42 PM EST ----- Please call pt and let her know the CT scan was fine, no evidence of cancer recurrence. Thanks  Truitt Merle  09/13/2016

## 2016-09-15 ENCOUNTER — Other Ambulatory Visit (HOSPITAL_COMMUNITY)
Admission: RE | Admit: 2016-09-15 | Discharge: 2016-09-15 | Disposition: A | Discharge status: Home / Self Care | Payer: Federal, State, Local not specified - PPO | Source: Ambulatory Visit | Attending: Hematology | Admitting: Hematology

## 2016-09-15 DIAGNOSIS — C189 Malignant neoplasm of colon, unspecified: Secondary | ICD-10-CM | POA: Insufficient documentation

## 2016-09-18 DIAGNOSIS — R2232 Localized swelling, mass and lump, left upper limb: Secondary | ICD-10-CM | POA: Diagnosis not present

## 2016-09-18 DIAGNOSIS — R2231 Localized swelling, mass and lump, right upper limb: Secondary | ICD-10-CM | POA: Diagnosis not present

## 2016-09-18 DIAGNOSIS — M180 Bilateral primary osteoarthritis of first carpometacarpal joints: Secondary | ICD-10-CM | POA: Diagnosis not present

## 2016-09-18 DIAGNOSIS — M1811 Unilateral primary osteoarthritis of first carpometacarpal joint, right hand: Secondary | ICD-10-CM | POA: Diagnosis not present

## 2016-09-19 DIAGNOSIS — L0889 Other specified local infections of the skin and subcutaneous tissue: Secondary | ICD-10-CM | POA: Diagnosis not present

## 2016-09-19 DIAGNOSIS — L0101 Non-bullous impetigo: Secondary | ICD-10-CM | POA: Diagnosis not present

## 2016-09-19 DIAGNOSIS — L720 Epidermal cyst: Secondary | ICD-10-CM | POA: Diagnosis not present

## 2016-10-02 ENCOUNTER — Encounter: Payer: Self-pay | Admitting: Hematology

## 2016-11-05 IMAGING — US US TRANSVAGINAL NON-OB
1 series · 14 of 25 positions shown · non-contrast
Comparison: CT 08/05/2015

CLINICAL DATA: Rectal mass.  Check uterus and ovaries

EXAM:
TRANSABDOMINAL AND TRANSVAGINAL ULTRASOUND OF PELVIS
TECHNIQUE: Both transabdominal and transvaginal ultrasound examinations of the
pelvis were performed. Transabdominal technique was performed for
global imaging of the pelvis including uterus, ovaries, adnexal
regions, and pelvic cul-de-sac. It was necessary to proceed with
endovaginal exam following the transabdominal exam to visualize the
uterus, endometrium, ovaries and adnexa .

[Series 1: us transvaginal non-ob · 0.26mm/px · 14 of 85 slices shown]
[im 1/85]
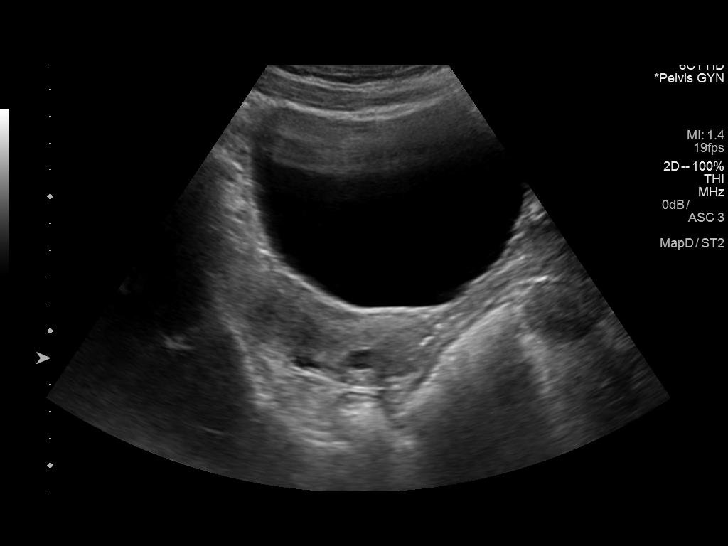
[im 8/85]
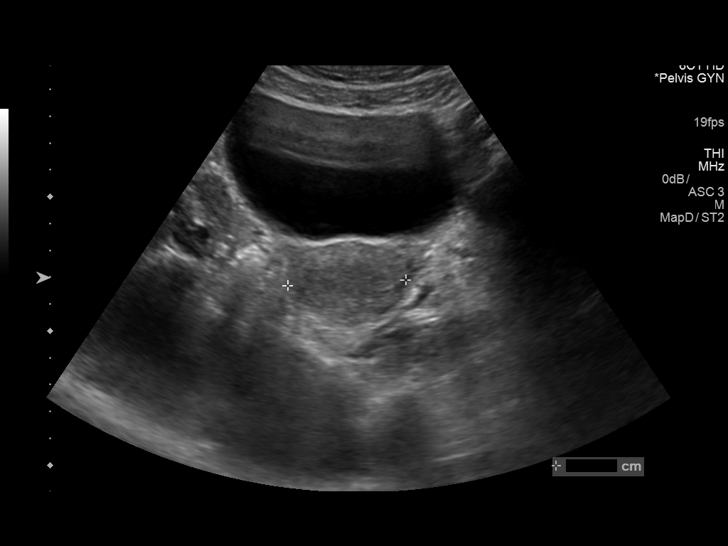
[im 15/85]
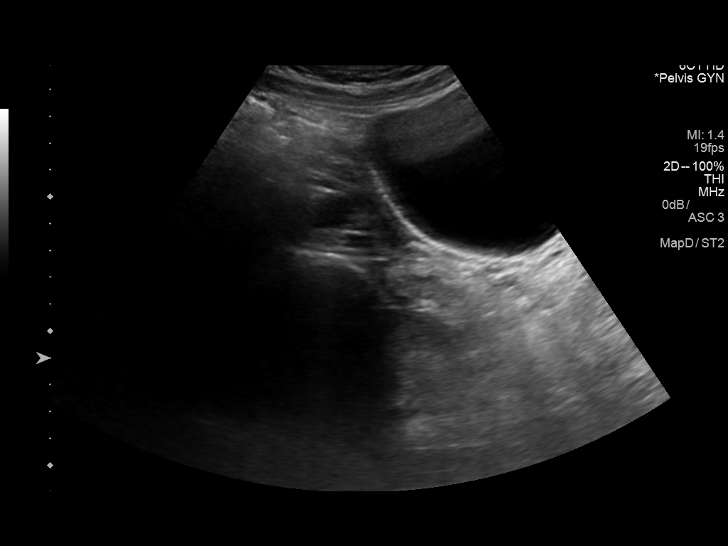
[im 22/85]
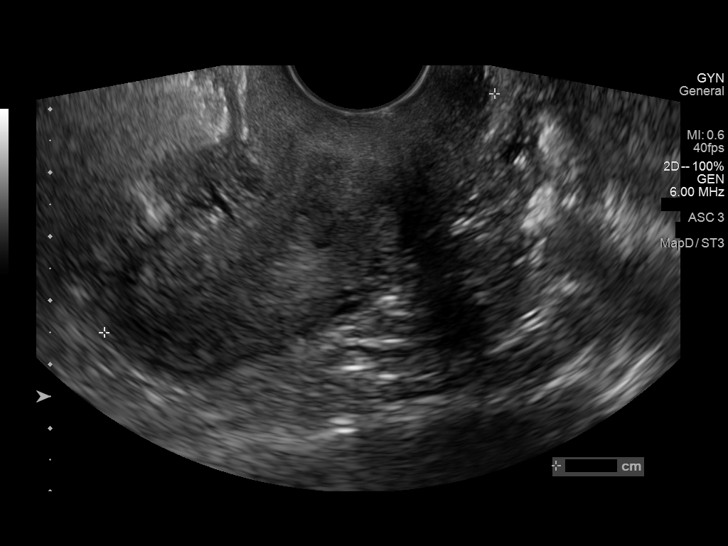
[im 29/85]
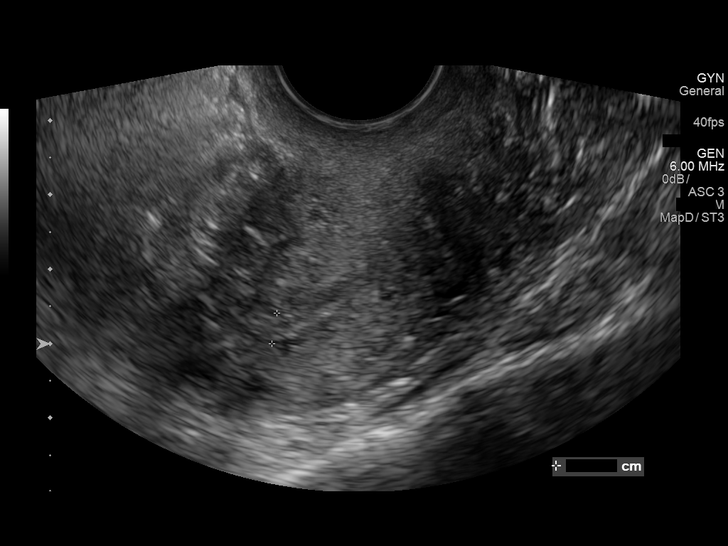
[im 32/85]
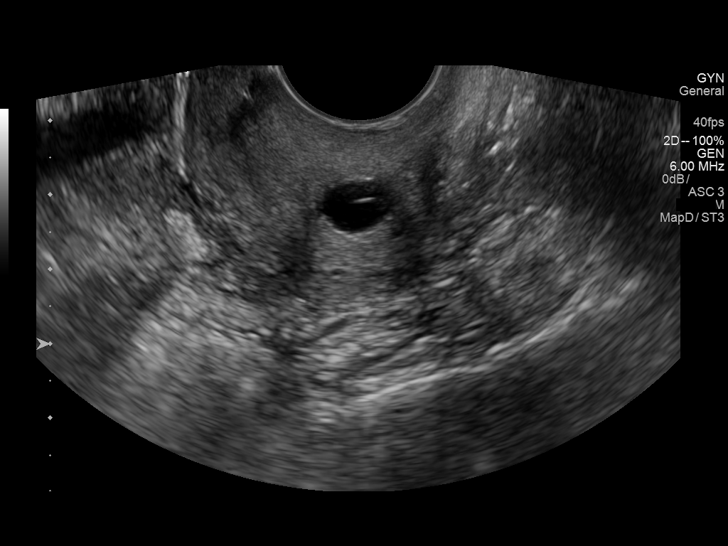
[im 39/85]
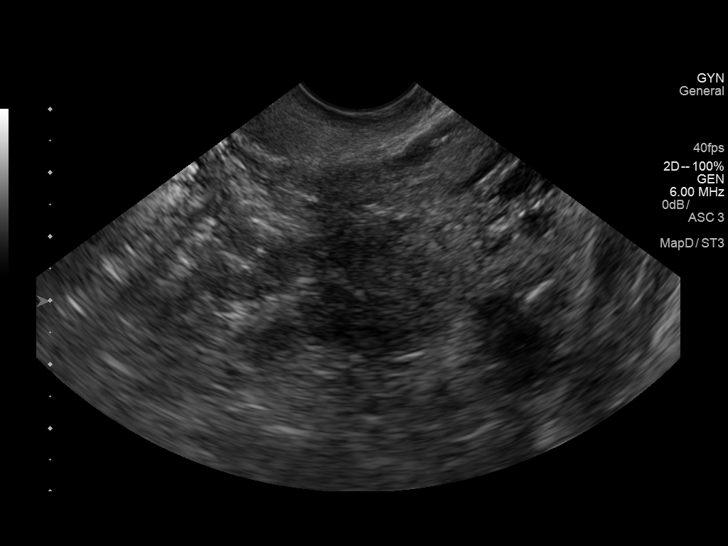
[im 46/85]
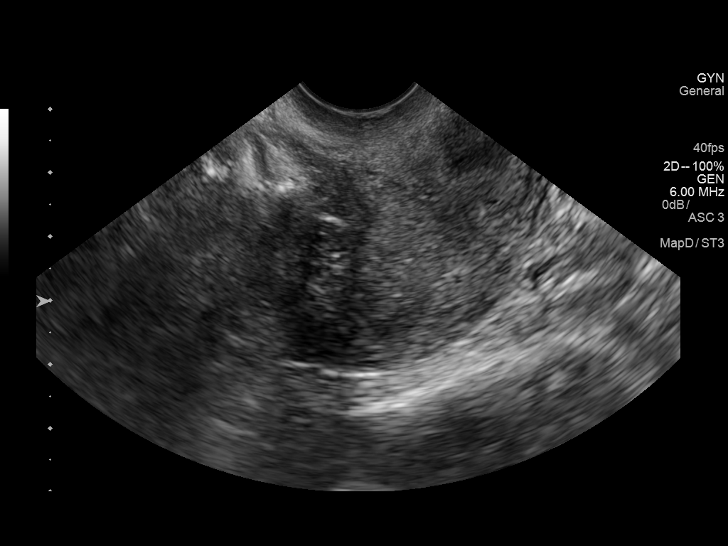
[im 53/85]
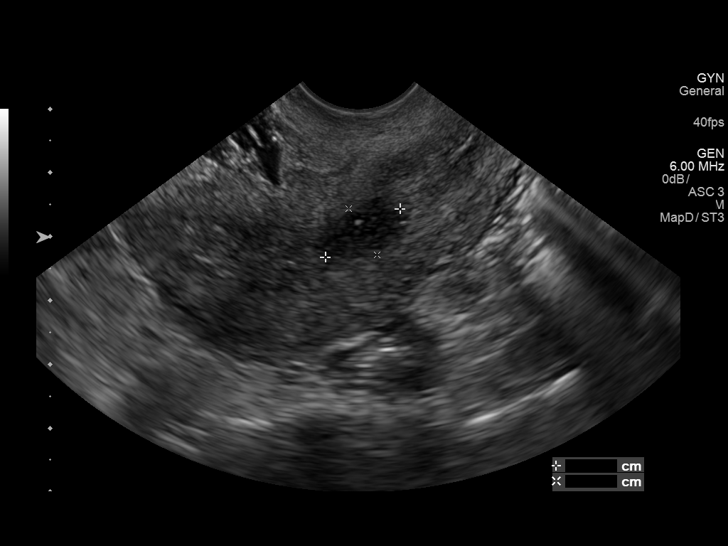
[im 57/85]
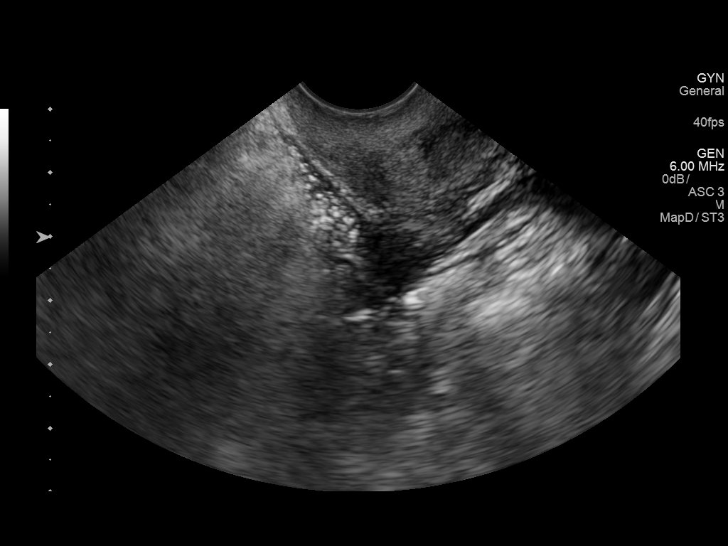
[im 64/85]
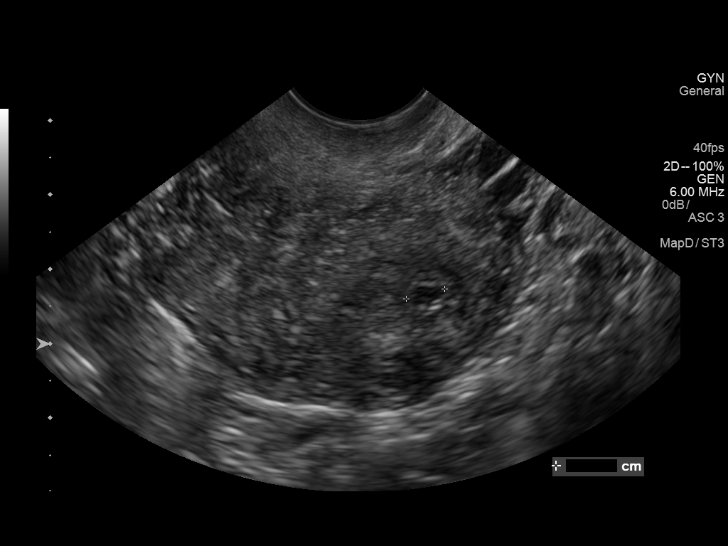
[im 71/85]
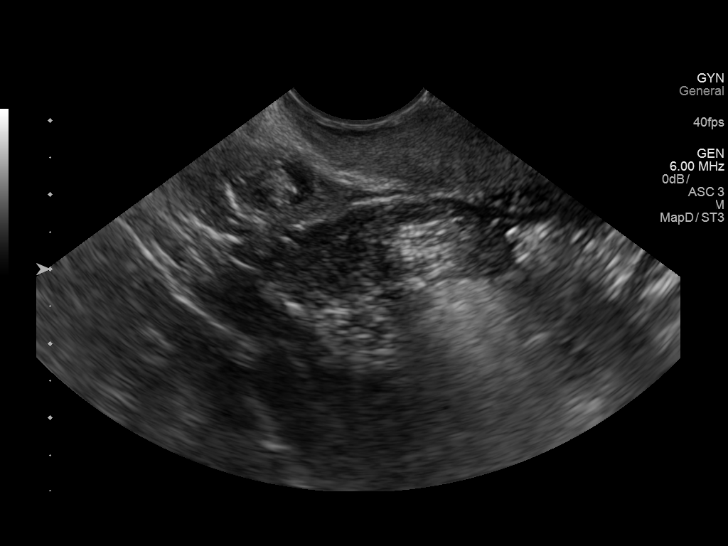
[im 78/85]
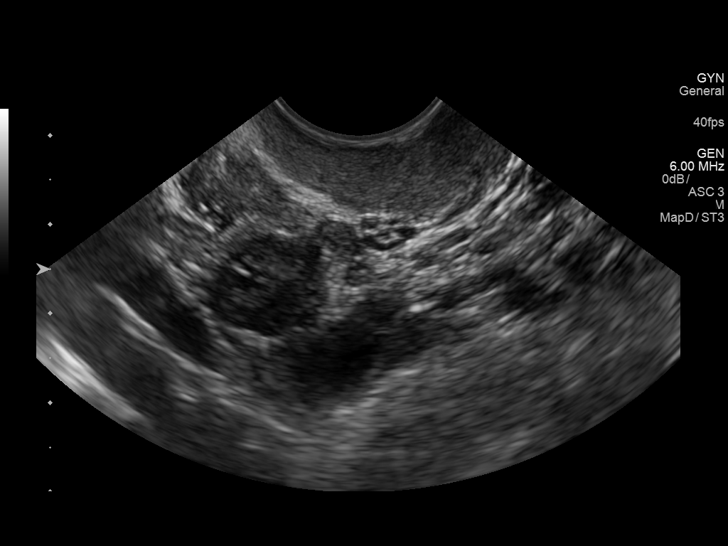
[im 85/85]
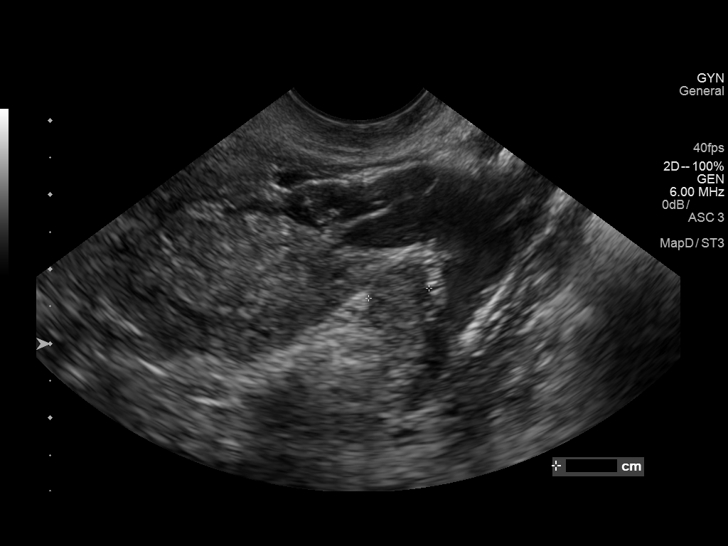

[14 of 25 positions shown; findings below may reference images not displayed]

FINDINGS: Uterus

Measurements: 7.8 x 2.6 x 4.4 cm. Small hypoechoic area noted in the
lower uterine segment measures up to 1.4 cm, likely small fibroid.
Small hypoechoic area in the fundus measures up to 1.5 cm, likely
small fibroid.

Endometrium

Thickness: 4 mm in thickness.  No focal abnormality visualized.

Right ovary

Measurements: 2.6 x 1.1 x 1.6 cm. Normal appearance/no adnexal mass.

Left ovary

Measurements: 2.3 x 1.0 x 0.8 cm. Normal appearance/no adnexal mass.

Other findings

Trace free fluid in the pelvis
IMPRESSION: Small hypoechoic areas in the uterus, most compatible with small
fibroids. No acute findings.

## 2016-11-13 ENCOUNTER — Encounter: Payer: Self-pay | Admitting: Hematology

## 2016-11-15 ENCOUNTER — Encounter: Payer: Self-pay | Admitting: Hematology

## 2016-11-17 ENCOUNTER — Telehealth: Payer: Self-pay | Admitting: Hematology

## 2016-11-17 NOTE — Telephone Encounter (Signed)
Unable to reach patient . Left message with appt time and date.

## 2016-11-18 ENCOUNTER — Encounter: Payer: Self-pay | Admitting: Hematology

## 2016-11-20 ENCOUNTER — Other Ambulatory Visit (HOSPITAL_BASED_OUTPATIENT_CLINIC_OR_DEPARTMENT_OTHER): Payer: Federal, State, Local not specified - PPO

## 2016-11-20 DIAGNOSIS — C20 Malignant neoplasm of rectum: Secondary | ICD-10-CM

## 2016-11-20 LAB — COMPREHENSIVE METABOLIC PANEL
ALT: 19 U/L (ref 0–55)
ANION GAP: 7 meq/L (ref 3–11)
AST: 23 U/L (ref 5–34)
Albumin: 4 g/dL (ref 3.5–5.0)
Alkaline Phosphatase: 95 U/L (ref 40–150)
BUN: 15.5 mg/dL (ref 7.0–26.0)
CALCIUM: 8.9 mg/dL (ref 8.4–10.4)
CHLORIDE: 105 meq/L (ref 98–109)
CO2: 28 mEq/L (ref 22–29)
CREATININE: 0.8 mg/dL (ref 0.6–1.1)
EGFR: 74 mL/min/{1.73_m2} — AB (ref >90)
Glucose: 85 mg/dl (ref 70–140)
POTASSIUM: 3.9 meq/L (ref 3.5–5.1)
Sodium: 140 mEq/L (ref 136–145)
Total Bilirubin: 0.38 mg/dL (ref 0.20–1.20)
Total Protein: 6.7 g/dL (ref 6.4–8.3)

## 2016-11-20 LAB — CBC WITH DIFFERENTIAL/PLATELET
BASO%: 2.1 % — ABNORMAL HIGH (ref 0.0–2.0)
BASOS ABS: 0.1 10*3/uL (ref 0.0–0.1)
EOS ABS: 0.2 10*3/uL (ref 0.0–0.5)
EOS%: 3.8 % (ref 0.0–7.0)
HEMATOCRIT: 36.6 % (ref 34.8–46.6)
HGB: 12.4 g/dL (ref 11.6–15.9)
LYMPH#: 0.8 10*3/uL — AB (ref 0.9–3.3)
LYMPH%: 19.1 % (ref 14.0–49.7)
MCH: 30.4 pg (ref 25.1–34.0)
MCHC: 33.9 g/dL (ref 31.5–36.0)
MCV: 89.8 fL (ref 79.5–101.0)
MONO#: 0.5 10*3/uL (ref 0.1–0.9)
MONO%: 11.2 % (ref 0.0–14.0)
NEUT#: 2.8 10*3/uL (ref 1.5–6.5)
NEUT%: 63.8 % (ref 38.4–76.8)
PLATELETS: 263 10*3/uL (ref 145–400)
RBC: 4.08 10*6/uL (ref 3.70–5.45)
RDW: 13.5 % (ref 11.2–14.5)
WBC: 4.4 10*3/uL (ref 3.9–10.3)

## 2016-11-21 ENCOUNTER — Telehealth: Payer: Self-pay | Admitting: *Deleted

## 2016-11-21 LAB — CEA (PARALLEL TESTING): CEA: 0.9 ng/mL

## 2016-11-21 LAB — CEA (IN HOUSE-CHCC): CEA (CHCC-In House): 2.23 ng/mL (ref 0.00–5.00)

## 2016-11-21 NOTE — Telephone Encounter (Signed)
TCT patient to advise patient of normal lab results from 11/20/16. No answer to call but was able to leave message on identified phone.

## 2016-11-24 ENCOUNTER — Telehealth: Payer: Self-pay | Admitting: *Deleted

## 2016-11-24 ENCOUNTER — Encounter: Payer: Self-pay | Admitting: Hematology

## 2016-11-24 NOTE — Telephone Encounter (Signed)
Called pt to inform of lab results & she states someone had already called her but she would like to have written results of the genetic testing done for lynch syndrome. Informed that I would let Dr Burr Medico know.

## 2016-11-29 NOTE — Telephone Encounter (Signed)
I have printed the result, please send it to pt. The test for lynch syndrome is on the last two pages (mismatch repair protein, and microsatellite instability).   Truitt Merle MD

## 2016-11-29 NOTE — Telephone Encounter (Signed)
Mailed test results to pt's home today as per Dr. Ernestina Penna instructions.

## 2016-11-30 DIAGNOSIS — Z85048 Personal history of other malignant neoplasm of rectum, rectosigmoid junction, and anus: Secondary | ICD-10-CM | POA: Diagnosis not present

## 2017-02-01 DIAGNOSIS — L821 Other seborrheic keratosis: Secondary | ICD-10-CM | POA: Diagnosis not present

## 2017-02-01 DIAGNOSIS — D2262 Melanocytic nevi of left upper limb, including shoulder: Secondary | ICD-10-CM | POA: Diagnosis not present

## 2017-02-01 DIAGNOSIS — Z85828 Personal history of other malignant neoplasm of skin: Secondary | ICD-10-CM | POA: Diagnosis not present

## 2017-02-01 DIAGNOSIS — Z85820 Personal history of malignant melanoma of skin: Secondary | ICD-10-CM | POA: Diagnosis not present

## 2017-03-13 DIAGNOSIS — Z01419 Encounter for gynecological examination (general) (routine) without abnormal findings: Secondary | ICD-10-CM | POA: Diagnosis not present

## 2017-03-13 DIAGNOSIS — Z6821 Body mass index (BMI) 21.0-21.9, adult: Secondary | ICD-10-CM | POA: Diagnosis not present

## 2017-03-28 ENCOUNTER — Other Ambulatory Visit (HOSPITAL_BASED_OUTPATIENT_CLINIC_OR_DEPARTMENT_OTHER): Payer: Federal, State, Local not specified - PPO

## 2017-03-28 DIAGNOSIS — C20 Malignant neoplasm of rectum: Secondary | ICD-10-CM

## 2017-03-28 LAB — CBC WITH DIFFERENTIAL/PLATELET
BASO%: 1.1 % (ref 0.0–2.0)
BASOS ABS: 0.1 10*3/uL (ref 0.0–0.1)
EOS%: 1.8 % (ref 0.0–7.0)
Eosinophils Absolute: 0.1 10*3/uL (ref 0.0–0.5)
HCT: 39.5 % (ref 34.8–46.6)
HEMOGLOBIN: 13.4 g/dL (ref 11.6–15.9)
LYMPH%: 15.4 % (ref 14.0–49.7)
MCH: 30.4 pg (ref 25.1–34.0)
MCHC: 33.9 g/dL (ref 31.5–36.0)
MCV: 89.6 fL (ref 79.5–101.0)
MONO#: 0.5 10*3/uL (ref 0.1–0.9)
MONO%: 8.9 % (ref 0.0–14.0)
NEUT%: 72.8 % (ref 38.4–76.8)
NEUTROS ABS: 4 10*3/uL (ref 1.5–6.5)
Platelets: 273 10*3/uL (ref 145–400)
RBC: 4.41 10*6/uL (ref 3.70–5.45)
RDW: 13 % (ref 11.2–14.5)
WBC: 5.5 10*3/uL (ref 3.9–10.3)
lymph#: 0.9 10*3/uL (ref 0.9–3.3)

## 2017-03-28 LAB — COMPREHENSIVE METABOLIC PANEL
ALBUMIN: 4.3 g/dL (ref 3.5–5.0)
ALT: 21 U/L (ref 0–55)
AST: 27 U/L (ref 5–34)
Alkaline Phosphatase: 91 U/L (ref 40–150)
Anion Gap: 9 mEq/L (ref 3–11)
BUN: 10.5 mg/dL (ref 7.0–26.0)
CHLORIDE: 105 meq/L (ref 98–109)
CO2: 25 meq/L (ref 22–29)
Calcium: 9.4 mg/dL (ref 8.4–10.4)
Creatinine: 0.8 mg/dL (ref 0.6–1.1)
EGFR: 75 mL/min/{1.73_m2} — AB (ref >90)
GLUCOSE: 130 mg/dL (ref 70–140)
POTASSIUM: 3.7 meq/L (ref 3.5–5.1)
SODIUM: 139 meq/L (ref 136–145)
Total Bilirubin: 0.76 mg/dL (ref 0.20–1.20)
Total Protein: 7.1 g/dL (ref 6.4–8.3)

## 2017-03-29 LAB — CEA (IN HOUSE-CHCC): CEA (CHCC-IN HOUSE): 2.28 ng/mL (ref 0.00–5.00)

## 2017-03-29 NOTE — Progress Notes (Signed)
[place]  [phone] [phone]  Clinic follow up Note   Patient Care Team: Kathyrn Lass, MD as PCP - General (Family Medicine) [name], MD as Consulting Physician (General Surgery) Kyung Rudd, MD as Consulting Physician (Radiation Oncology) Mauri Pole, MD as Consulting Physician (Gastroenterology)   CHIEF COMPLAINTS:  Follow up rectal cancer   Oncology History   Rectal adenocarcinoma [facility])   Staging form: Colon and Rectum, AJCC 7th Edition     Clinical stage from 08/04/2015: Stage IIA (T3, N0, M0) - Signed by Truitt Merle, MD on 08/31/2015       Rectal adenocarcinoma ([place])   08/04/2015 Initial Biopsy    rectal mass adenocarcinoma, descending colon polyp hyperplastic polyp.       08/04/2015 Procedure    5 cm rectal mass starting at 10 cm from anal verge extending to 15 cm, 67m sigmoid polyp was removed       08/06/2015 Imaging    CT chest, abdomen and pelvis w contrast showed a possible area of rectal wall thickening aterior to the distal sacrum, no adenopathy or distant metastasis       08/13/2015 Initial Diagnosis    [place]      08/19/2015 Procedure    low EUS showed a T3N0 rectal mass       08/31/2015 -  Radiation Therapy    radiation to rectal adenocarcinoma       08/31/2015 - 10/08/2015 Chemotherapy    Capecitabine '1500mg'$  q12h with concurrent radiation, held after 09/29/2015 due to transaminitis       11/26/2015 Surgery    Rectosigmoid segmental resection, surgical margins were negative.      11/26/2015 Pathology Results    Rectosigmoid segmental resection showed invasive colonic adenocarcinoma, grade 2, T1, margins were negative, 15 lymph nodes were negative. LVI (-), PNI (-)      08/24/2016 Imaging     Colonscopy  Performed by Dr. NSilverio DecampImpression:  - Diverticulosis in the sigmoid colon - one 1 mm polyp in the sigmoid colon, removed with a cold biopsy forceps. Resceted and  retrieeved - non-bleeding interanl hemorrhoids - anal papilla(e) were hypertrophied - the examination was otherwise normal        09/11/2016 Imaging    CT CAP w contrast 09/11/2016 IMPRESSION: 1. No evidence of metastatic rectal cancer within the chest, abdomen or pelvis. 2. Interval postsurgical changes in the rectum and anterior abdominal wall without apparent complication. 3. Tiny renal lesions bilaterally, too small to optimally characterize, although likely cysts.       HISTORY OF PRESENTING ILLNESS (08/13/2015):  [name]61y.o. female is here because of newly diagnosed rectal adenocarcinoma. She is accompanied by her husband to our multidisciplinary GI clinic today.  She had a colonoscopy in 05/2013 by Dr. BOlevia Percheswhich showed a 3-512msessile rectal polyp, which was removed, pathology revealed tubular adenoma. She has had intermittent rectal bleeding since Oct 2016, mild, she had mild constipation, no pain, nause or change of apeptie, no weihgt loss. She called Dr. BrNichola Sizerffice, who has recently retired, and was seen by Dr. NaSilverio DecampShe underwent colonoscopy on 08/04/2015, which showed a 5 cm rectal mass starting at a 10 cm from an approach extending to 15 cm, and a 3 mm sigmoid polyp which was removed. The rectal mass biopsy showed adenocarcinoma. CT of the chest abdomen and pelvis with contrast was performed on 08/06/2015, which showed no adenopathy or distant metastasis.  She otherwise feels well,  no other complains. She is a homemaker, also works as a English as a second language teacher. She eats healthy diet, and walks 5 miles a day, very physically active.   She had a early stage skin melanoma on her right arm, was removed a few years ago. She also has a basal cell skin cancer on her face, will need a resection soon. She otherwise is very healthy. No family history of colon cancer or other malignancy.   CURRENT THERAPY: Surveillance  INTERIM HISTORY Molly Vang returns for follow up.  Since our last visit, she denies any pains, nausea or fatigue. She does have some constipation that she relates to dehydration. She began drinking more water and it is resolving now. She has restarted running and did 6 miles this morning. Her dermatologist has said she has a lipoma on her left, mid back and she is thinking of having it removedShe has been doing well overall.    MEDICAL HISTORY:  Past Medical History:  Diagnosis Date  . Cancer ([place])    melanoma right bicep removed  . Crepitus of cervical spine    face, neck, arms; MD and anesthesia dispute the cause  . Rectal cancer ([place])   . SVT (supraventricular tachycardia) ([place]) 06/11/2013   svt on 10/6// ablation on 10/15    SURGICAL HISTORY: Past Surgical History:  Procedure Laterality Date  . COLONOSCOPY    . EUS N/A 08/19/2015   Procedure: LOWER ENDOSCOPIC ULTRASOUND (EUS);  Surgeon: Milus Banister, MD;  Location: [facility];  Service: Endoscopy;  Laterality: N/A;  . NM MYOCAR PERF WALL MOTION  12/14/2010   normal  . OVARIAN CYST SURGERY  1993  . SUPRAVENTRICULAR TACHYCARDIA ABLATION  06/11/2013  . SUPRAVENTRICULAR TACHYCARDIA ABLATION N/A 06/11/2013   Procedure: SUPRAVENTRICULAR TACHYCARDIA ABLATION;  Surgeon: Evans Lance, MD;  Location: [facility] CATH LAB;  Service: Cardiovascular;  Laterality: N/A;  . TONSILLECTOMY  1960  . US ECHOCARDIOGRAPHY  11/14/11   trace TR,MR  . XI ROBOTIC ASSISTED LOWER ANTERIOR RESECTION N/A 11/26/2015   Procedure: XI ROBOTIC ASSISTED LOWER ANTERIOR RESECTION WITH RIGID PROCTOSCOPY;  Surgeon: [name], MD;  Location: [facility] ORS;  Service: General;  Laterality: N/A;    SOCIAL HISTORY: Social History   Social History  . Marital Status: Married    Spouse Name: N/A  . Number of Children: 2   . Years of Education: N/A   Occupational History  . She is a Materials engineer    Social History Main Topics  . Smoking status: Never Smoker   . Smokeless tobacco: Never Used  . Alcohol Use: Yes      Comment: 06/11/2013 "beer or glass of wine maybe once/month"  . Drug Use: No  . Sexual Activity: Yes   Other Topics Concern  . Not on file   Social History Narrative    FAMILY HISTORY: Family History  Problem Relation Age of Onset  . Hypertension Father   . Hyperlipidemia Father   . Hypertension Mother   . Hyperlipidemia Brother   . Colon cancer Neg Hx   . Stomach cancer Neg Hx   . Esophageal cancer Neg Hx   . Rectal cancer Neg Hx     ALLERGIES:  has No Known Allergies.  MEDICATIONS:  Current Outpatient Prescriptions  Medication Sig Dispense Refill  . aspirin EC 81 MG tablet Take 81 mg by mouth daily. Reported on 10/08/2015    . Calcium Carbonate-Vitamin D (CALCIUM 600 + D PO) Take 1 tablet by mouth daily. Vit. D3 is 400iu    .  chlorhexidine (PERIDEX) 0.12 % solution Use as directed 15 mLs in the mouth or throat daily.    . chlorpheniramine (CHLOR-TRIMETON) 4 MG tablet Take 4 mg by mouth 2 (two) times daily. Reported on 09/03/2015    . fish oil-omega-3 fatty acids 1000 MG capsule Take 1 g by mouth daily. EPA '570mg'$   And DHA '130mg'$     . Multiple Vitamins-Minerals (MULTIVITAMIN WOMEN 50+ PO) Take 1 tablet by mouth daily.    Vladimir Faster Glycol-Propyl Glycol (SYSTANE) 0.4-0.3 % SOLN Apply 1 drop to eye every evening.    . zolpidem (AMBIEN) 10 MG tablet Take 5 mg by mouth at bedtime as needed for sleep.   2  . naproxen sodium (ANAPROX) 220 MG tablet Take 220 mg by mouth daily as needed (for pain). Reported on 09/28/2015     Current Facility-Administered Medications  Medication Dose Route Frequency Provider Last Rate Last Dose  . 0.9 %  sodium chloride infusion  500 mL Intravenous Continuous Nandigam, Kavitha V, MD        REVIEW OF SYSTEMS:   Constitutional: Denies fevers, chills or abnormal night sweats Eyes: Denies blurriness of vision, double vision or watery eyes Ears, nose, mouth, throat, and face: Denies mucositis or sore throat Respiratory: Denies cough, dyspnea or  wheezes Cardiovascular: Denies palpitation, chest discomfort or lower extremity swelling Gastrointestinal:  Denies nausea, heartburn or change in bowel habits Skin: Denies abnormal skin rashes (+)lipoma on left back Lymphatics: Denies new lymphadenopathy or easy bruising Neurological:Denies numbness, tingling or new weaknesses Behavioral/Psych: Mood is stable, no new changes  All other systems were reviewed with the patient and are negative.  PHYSICAL EXAMINATION:  ECOG PERFORMANCE STATUS: 0 - Asymptomatic  Vitals:   03/30/17 1326  BP: 123/68  Pulse: 76  Resp: 18  Temp: 98 F (36.7 C)   Filed Weights   03/30/17 1326  Weight: 138 lb 9.6 oz (62.9 kg)    GENERAL:alert, no distress and comfortable SKIN: skin color, texture, turgor are normal, no rashes or significant lesions EYES: normal, conjunctiva are pink and non-injected, sclera clear OROPHARYNX:no exudate, no erythema and lips, buccal mucosa, and tongue normal  NECK: supple, thyroid normal size, non-tender, without nodularity LYMPH:  no palpable lymphadenopathy in the cervical, axillary or inguinal LUNGS: clear to auscultation and percussion with normal breathing effort HEART: regular rate & rhythm and no murmurs and no lower extremity edema ABDOMEN:abdomen soft, non-tender and normal bowel sounds. Laparoscopic surgical incisions are well-healed. Rectal exam was deferred, she is going to have a colonoscopy next week. Musculoskeletal:no cyanosis of digits and no clubbing  PSYCH: alert & oriented x 3 with fluent speech NEURO: no focal motor/sensory deficits RECTAL:negative without mass, lesions or tenderness   LABORATORY DATA:  I have reviewed the data as listed CBC Latest Ref Rng & Units 03/28/2017 11/20/2016 08/07/2016  WBC 3.9 - 10.3 10e3/uL 5.5 4.4 4.8  Hemoglobin 11.6 - 15.9 g/dL 13.4 12.4 13.6  Hematocrit 34.8 - 46.6 % 39.5 36.6 41.4  Platelets 145 - 400 10e3/uL 273 263 291    CMP Latest Ref Rng & Units 03/28/2017  11/20/2016 08/07/2016  Glucose 70 - 140 mg/dl 130 85 94  BUN 7.0 - 26.0 mg/dL 10.5 15.5 10.7  Creatinine 0.6 - 1.1 mg/dL 0.8 0.8 0.7  Sodium 136 - 145 mEq/L 139 140 139  Potassium 3.5 - 5.1 mEq/L 3.7 3.9 3.8  Chloride 101 - 111 mmol/L - - -  CO2 22 - 29 mEq/L '25 28 25  '$ Calcium 8.4 -  10.4 mg/dL 9.4 8.9 9.1  Total Protein 6.4 - 8.3 g/dL 7.1 6.7 7.4  Total Bilirubin 0.20 - 1.20 mg/dL 0.76 0.38 0.59  Alkaline Phos 40 - 150 U/L 91 95 103  AST 5 - 34 U/L '27 23 26  '$ ALT 0 - 55 U/L '21 19 21    '$ PATHOLOGY REPORT: Diagnosis 11/26/2015 1. Colon, segmental resection for tumor, rectosigmoid INVASIVE COLONIC ADENOCARCINOMA, GRADE 2 THE TUMOR INVADES SUBMUCOSA MARGINS OF RESECTION ARE NEGATIVE FOR TUMOR FIFTEEN BENIGN LYMPH NODES (0/15) 2. Colon, resection margin (donut), final distal BENIGN COLONIC TISSUE NEGATIVE FOR MALIGNANCY  Microscopic Comment 1. COLON AND RECTUM (INCLUDING TRANS-ANAL RESECTION): Specimen: Rectosigmoid colon Procedure: Segmental resection Tumor site: rectum Specimen integrity: Intact Macroscopic intactness of mesorectum: Not applicable: x Complete: NA Near complete: NA Incomplete: NA Cannot be determined (specify): NA Macroscopic tumor perforation: Submucosa Invasive tumor: Maximum size: 0.4 cm Histologic type(s): Adenocarcinoma Histologic grade and differentiation: G2 G1: well differentiated/low grade G2: moderately differentiated/low grade G3: poorly differentiated/high grade G4: undifferentiated/high grade Type of polyp in which invasive carcinoma arose: Tubular adenoma Microscopic extension of invasive tumor: Submucosa Lymph-Vascular invasion: Negative Peri-neural invasion: Negative Tumor deposit(s) (discontinuous extramural extension): Negative Resection margins: Proximal margin: 7.1 cm Distal margin: 1.3 Circumferential (radial) (posterior ascending, posterior descending; lateral and posterior mid-rectum; and entire lower 1/3 rectum): 3.5  cm Mesenteric margin (sigmoid and transverse): NA Distance closest margin (if all above margins negative): 1.7 cm Trans-anal resection margins only: Deep margin: NA Mucosal Margin: NA Distance closest mucosal margin (if negative): NA Treatment effect (neo-adjuvant therapy): Present Additional polyp(s): Negative Non-neoplastic findings: unremarkable Lymph nodes: number examined 15; number positive: 0 Pathologic Staging: ypT1, ypN0, Mx Ancillary studies: per request   RADIOGRAPHIC STUDIES: I have personally reviewed the radiological images as listed and agreed with the findings in the report. No results found.   Colonscopy 08/24/2016 Dr. Silverio Decamp Impression:  - Diverticulosis in the sigmoid colon - one 1 mm polyp in the sigmoid colon, removed with a cold biopsy forceps. Resceted and retrieeved - non-bleeding interanl hemorrhoids - anal papilla(e) were hypertrophied - the examination was otherwise normal  COLONOSCOPY 08/04/2015 Dr. Silverio Decamp  COLON FINDINGS:  5 cm rectal mass starting at 10 cm from anal verge extending to 15 cm, friable ulcerated mucosa concerning for malignancy, multiple random biopsies were obtained. 3 mm polyp in sigmoid colon removed by cold biopsy forceps. Diverticulosis. Retroflexed views revealed internal hemorrhoids. The time to cecum = 0.7 Withdrawal time = 14.0 The scope was withdrawn and the procedure completed.  COMPLICATIONS: There were no immediate complications.  ENDOSCOPIC IMPRESSION: 5 cm rectal mass starting at 10 cm from anal verge extending to 15 cm, friable ulcerated mucosa concerning for malignancy, multiple random biopsies were obtained. 3 mm polyp in sigmoid colon removed by cold biopsy forceps. Diverticulosis RECOMMENDATIONS: 1. Await pathology results 2. CT chest, abdomen and pelvis with contrast Referral to surgery based on pathology and imaging results Repeat colonoscopy in 6-12 months eSigned: Harl Bowie, MD 08/04/15  EUS by  Dr. Ardis Hughs  ENDOSCOPIC IMPRESSION: 08/19/2015 1. Non circumferential, 3cm long uT3N0 (Stage IIa) rectal adenocarcinoma with distal edge located 8cm from the anal verge. The lateral edges of the tumor were labeled with submucosal injection of SPOT following EUS examination. 2. The "asymmetric 3.2 x 2.6 cm soft tissue thickening involving the posterior right wall of the distal rectum at the anal verge (image 120) noted by CT scan was located. It is distinct from the malignant mass described above, was vaguely bordered  and I suspect this is gynecologic in orgin.  RECOMMENDATIONS: She will likely benefit from neoadjuvant chemo/XRT. Prefer more information about the soft tissue described in #2 above and will order pelvic ultrasound. Will communicate these results with Dr. Lisbeth Renshaw, Derrick Ravel, [place] CAP w contrast 09/11/2016 IMPRESSION: 1. No evidence of metastatic rectal cancer within the chest, abdomen or pelvis. 2. Interval postsurgical changes in the rectum and anterior abdominal wall without apparent complication. 3. Tiny renal lesions bilaterally, too small to optimally characterize, although likely cysts.  ASSESSMENT & PLAN:  61 y.o.  Caucasian female, history of superficial skin melanoma and basal cell carcinoma, otherwise healthy and fit, presented with rectal bleeding.   1. Rectal adenocarcinoma, cT3N0M0, ypT1N0M0, MMR normal  -I previously reviewed her colonoscopy finding and the surgical biopsy results in detail with her and her husband. -her EUS showed a T3N0 disease -She has completed neoadjuvant chemotherapy and radiation, tolerated well overall. Her hematochezia resolved after the first few weeks of radiation. -Her Xeloda was held for last 7 days of radiation due to elevated liver enzymes -She has had excellent response to neoadjuvant chemotherapy and radiation, surgical path reviewed a T1 residual disease, lymph nodes were negative. -We discussed that standard therapy  for T3 rectal cancer is adjuvant chemotherapy. However she is very reluctant to have chemotherapy, and she has had very good response to neoadjuvant chemotherapy and radiation, the residual disease is T1, I think skipping adjuvant chemotherapy and start surveillance is also reasonable. -I reviewed her surveillance CT scan images and findings from January 2018 in detail with patient. Includes incidental findings. No evidence of recurrence. -She is clinically doing well, physical exam and rectal exam was unremarkable, lab results reviewed, her CBC, CEA and CMP are normal. No clinical concern for recurrence. -Continue surveillance. She will continue to see Dr. Marcello Moores and me alternatively every 6 months, for a total of 5 years.  - she has a scheduled colonoscopy for December -her colonoscopy in 07/2016 was unremarkable except diverticulosis and a small polyp. She will have a repeated colonoscopy in December 2018 per Dr. Silverio Decamp -her tumor MMR was normal, no evidence of Lynch syndrome.  -will repeat an is a surveillance CT scan in one year, which will be her last surveillance CT scan if she is doing well. Will continue f/u for a total of 5 years   Plan -colonoscopy in December per Dr. Silverio Decamp  -She will see Dr. Marcello Moores in 6 months, I plan to see her back in a year with lab and surveillance CT abdomen and pelvis  afew days before.  -she will have lab here in 6 months.   Pt had multiple questions, and I answered to her satisfaction. She knows to call us if she has concerns before your next appointment.  I spent 25 minutes counseling the patient face to face. The total time spent in the appointment was 30 minutes and more than 50% was on counseling.  This document serves as a record of services personally performed by Truitt Merle, MD. It was created on her behalf by Brandt Loosen, a trained medical scribe. The creation of this record is based on the scribe's personal observations and the provider's  statements to them. This document has been checked and approved by the attending provider.  Truitt Merle  03/30/2017

## 2017-03-30 ENCOUNTER — Ambulatory Visit (HOSPITAL_BASED_OUTPATIENT_CLINIC_OR_DEPARTMENT_OTHER): Payer: Federal, State, Local not specified - PPO | Admitting: Hematology

## 2017-03-30 ENCOUNTER — Other Ambulatory Visit: Payer: Federal, State, Local not specified - PPO

## 2017-03-30 ENCOUNTER — Encounter: Payer: Self-pay | Admitting: Hematology

## 2017-03-30 VITALS — BP 123/68 | HR 76 | Temp 98.0°F | Resp 18 | Ht 67.0 in | Wt 138.6 lb

## 2017-03-30 DIAGNOSIS — C20 Malignant neoplasm of rectum: Secondary | ICD-10-CM

## 2017-03-30 DIAGNOSIS — Z85048 Personal history of other malignant neoplasm of rectum, rectosigmoid junction, and anus: Secondary | ICD-10-CM

## 2017-04-02 ENCOUNTER — Telehealth: Payer: Self-pay

## 2017-04-02 NOTE — Telephone Encounter (Signed)
Called and left message with husband. Gave upcoming apptoinment dates  For January, and August 2019. Also gave information on CT orders.

## 2017-05-16 DIAGNOSIS — M8580 Other specified disorders of bone density and structure, unspecified site: Secondary | ICD-10-CM | POA: Diagnosis not present

## 2017-05-16 DIAGNOSIS — Z0000 Encounter for general adult medical examination without abnormal findings: Secondary | ICD-10-CM | POA: Diagnosis not present

## 2017-05-21 DIAGNOSIS — Z23 Encounter for immunization: Secondary | ICD-10-CM | POA: Diagnosis not present

## 2017-05-23 DIAGNOSIS — Z0000 Encounter for general adult medical examination without abnormal findings: Secondary | ICD-10-CM | POA: Diagnosis not present

## 2017-05-30 DIAGNOSIS — Z85828 Personal history of other malignant neoplasm of skin: Secondary | ICD-10-CM | POA: Diagnosis not present

## 2017-05-30 DIAGNOSIS — L570 Actinic keratosis: Secondary | ICD-10-CM | POA: Diagnosis not present

## 2017-05-30 DIAGNOSIS — D1801 Hemangioma of skin and subcutaneous tissue: Secondary | ICD-10-CM | POA: Diagnosis not present

## 2017-05-30 DIAGNOSIS — L821 Other seborrheic keratosis: Secondary | ICD-10-CM | POA: Diagnosis not present

## 2017-05-30 DIAGNOSIS — Z85820 Personal history of malignant melanoma of skin: Secondary | ICD-10-CM | POA: Diagnosis not present

## 2017-07-03 ENCOUNTER — Telehealth: Payer: Self-pay | Admitting: Gastroenterology

## 2017-07-03 NOTE — Telephone Encounter (Signed)
Everything I find indicates repeat colonoscopy. Have I overlooked a change of the plans?

## 2017-07-04 ENCOUNTER — Encounter: Payer: Self-pay | Admitting: Gastroenterology

## 2017-07-04 NOTE — Telephone Encounter (Signed)
Dr. Silverio Decamp states pt should have recall colon not flex sig

## 2017-07-04 NOTE — Telephone Encounter (Signed)
Patient has been scheduled for recall colonoscopy on 08/24/17 and previsit 08/09/17.

## 2017-07-04 NOTE — Telephone Encounter (Signed)
She should be colonoscopy not a flex sig, not sure how it was changed in recall. Thanks

## 2017-08-13 ENCOUNTER — Ambulatory Visit (AMBULATORY_SURGERY_CENTER): Payer: Self-pay | Admitting: *Deleted

## 2017-08-13 ENCOUNTER — Other Ambulatory Visit: Payer: Self-pay

## 2017-08-13 VITALS — Ht 67.0 in | Wt 138.0 lb

## 2017-08-13 DIAGNOSIS — Z85038 Personal history of other malignant neoplasm of large intestine: Secondary | ICD-10-CM

## 2017-08-13 NOTE — Progress Notes (Signed)
Patient denies any allergies to egg or soy products. Patient denies complications with anesthesia/sedation.  Patient denies oxygen use at home and denies diet medications. Colonoscopy pamphlet given to patient.   

## 2017-08-24 ENCOUNTER — Ambulatory Visit (AMBULATORY_SURGERY_CENTER): Payer: Federal, State, Local not specified - PPO | Admitting: Gastroenterology

## 2017-08-24 ENCOUNTER — Encounter: Payer: Self-pay | Admitting: Gastroenterology

## 2017-08-24 VITALS — BP 106/78 | HR 82 | Temp 97.8°F | Resp 16 | Wt 138.0 lb

## 2017-08-24 DIAGNOSIS — Z85048 Personal history of other malignant neoplasm of rectum, rectosigmoid junction, and anus: Secondary | ICD-10-CM | POA: Diagnosis not present

## 2017-08-24 DIAGNOSIS — Z86010 Personal history of colonic polyps: Secondary | ICD-10-CM | POA: Diagnosis present

## 2017-08-24 MED ORDER — SODIUM CHLORIDE 0.9 % IV SOLN: 500.0000 mL | INTRAVENOUS | Status: DC

## 2017-08-24 NOTE — Patient Instructions (Signed)
**Handouts given to patient/care partner on diverticulosis and hemorrhoids" Patient had anal papillae  YOU HAD AN ENDOSCOPIC PROCEDURE TODAY AT [facility]:   Refer to the procedure report that was given to you for any specific questions about what was found during the examination.  If the procedure report does not answer your questions, please call your gastroenterologist to clarify.  If you requested that your care partner not be given the details of your procedure findings, then the procedure report has been included in a sealed envelope for you to review at your convenience later.  YOU SHOULD EXPECT: Some feelings of bloating in the abdomen. Passage of more gas than usual.  Walking can help get rid of the air that was put into your GI tract during the procedure and reduce the bloating. If you had a lower endoscopy (such as a colonoscopy or flexible sigmoidoscopy) you may notice spotting of blood in your stool or on the toilet paper. If you underwent a bowel prep for your procedure, you may not have a normal bowel movement for a few days.  Please Note:  You might notice some irritation and congestion in your nose or some drainage.  This is from the oxygen used during your procedure.  There is no need for concern and it should clear up in a day or so.  SYMPTOMS TO REPORT IMMEDIATELY:   Following lower endoscopy (colonoscopy or flexible sigmoidoscopy):  Excessive amounts of blood in the stool  Significant tenderness or worsening of abdominal pains  Swelling of the abdomen that is new, acute  Fever of 100F or higher   Following upper endoscopy (EGD)  Vomiting of blood or coffee ground material  New chest pain or pain under the shoulder blades  Painful or persistently difficult swallowing  New shortness of breath  Fever of 100F or higher  Black, tarry-looking stools  For urgent or emergent issues, a gastroenterologist can be reached at any hour by calling [phone]  [phone].   DIET:  We do recommend a small meal at first, but then you may proceed to your regular diet.  Drink plenty of fluids but you should avoid alcoholic beverages for 24 hours.  ACTIVITY:  You should plan to take it easy for the rest of today and you should NOT DRIVE or use heavy machinery until tomorrow (because of the sedation medicines used during the test).    FOLLOW UP: Our staff will call the number listed on your records the next business day following your procedure to check on you and address any questions or concerns that you may have regarding the information given to you following your procedure. If we do not reach you, we will leave a message.  However, if you are feeling well and you are not experiencing any problems, there is no need to return our call.  We will assume that you have returned to your regular daily activities without incident.  If any biopsies were taken you will be contacted by phone or by letter within the next 1-3 weeks.  Please call us at [phone] if you have not heard about the biopsies in 3 weeks.    SIGNATURES/CONFIDENTIALITY: You and/or your care partner have signed paperwork which will be entered into your electronic medical record.  These signatures attest to the fact that that the information above on your After Visit Summary has been reviewed and is understood.  Full responsibility of the confidentiality of this discharge information lies with you and/or  your care-partner.YOU HAD AN ENDOSCOPIC PROCEDURE TODAY AT [facility]:   Refer to the procedure report that was given to you for any specific questions about what was found during the examination.  If the procedure report does not answer your questions, please call your gastroenterologist to clarify.  If you requested that your care partner not be given the details of your procedure findings, then the procedure report has been included in a sealed envelope for you to review at  your convenience later.  YOU SHOULD EXPECT: Some feelings of bloating in the abdomen. Passage of more gas than usual.  Walking can help get rid of the air that was put into your GI tract during the procedure and reduce the bloating. If you had a lower endoscopy (such as a colonoscopy or flexible sigmoidoscopy) you may notice spotting of blood in your stool or on the toilet paper. If you underwent a bowel prep for your procedure, you may not have a normal bowel movement for a few days.  Please Note:  You might notice some irritation and congestion in your nose or some drainage.  This is from the oxygen used during your procedure.  There is no need for concern and it should clear up in a day or so.  SYMPTOMS TO REPORT IMMEDIATELY:   Following lower endoscopy (colonoscopy or flexible sigmoidoscopy):  Excessive amounts of blood in the stool  Significant tenderness or worsening of abdominal pains  Swelling of the abdomen that is new, acute  Fever of 100F or higher   For urgent or emergent issues, a gastroenterologist can be reached at any hour by calling [phone].   DIET:  We do recommend a small meal at first, but then you may proceed to your regular diet.  Drink plenty of fluids but you should avoid alcoholic beverages for 24 hours.  ACTIVITY:  You should plan to take it easy for the rest of today and you should NOT DRIVE or use heavy machinery until tomorrow (because of the sedation medicines used during the test).    FOLLOW UP: Our staff will call the number listed on your records the next business day following your procedure to check on you and address any questions or concerns that you may have regarding the information given to you following your procedure. If we do not reach you, we will leave a message.  However, if you are feeling well and you are not experiencing any problems, there is no need to return our call.  We will assume that you have returned to your regular daily  activities without incident.  If any biopsies were taken you will be contacted by phone or by letter within the next 1-3 weeks.  Please call us at [phone] if you have not heard about the biopsies in 3 weeks.    SIGNATURES/CONFIDENTIALITY: You and/or your care partner have signed paperwork which will be entered into your electronic medical record.  These signatures attest to the fact that that the information above on your After Visit Summary has been reviewed and is understood.  Full responsibility of the confidentiality of this discharge information lies with you and/or your care-partner.

## 2017-08-24 NOTE — Op Note (Signed)
[facility] Patient Name: Molly Vang Procedure Date: 08/24/2017 1:36 PM MRN: [id number] Endoscopist: Mauri Pole , MD Age: 61 Referring MD:  Date of Birth: 1956/01/10 Gender: Female Account #: [id number] Procedure:                Colonoscopy Indications:              High risk colon cancer surveillance: Personal                            history of colonic polyps, High risk colon cancer                            surveillance: Personal history of rectal cancer Medicines:                Monitored Anesthesia Care Procedure:                Pre-Anesthesia Assessment:                           - Prior to the procedure, a History and Physical                            was performed, and patient medications and                            allergies were reviewed. The patient's tolerance of                            previous anesthesia was also reviewed. The risks                            and benefits of the procedure and the sedation                            options and risks were discussed with the patient.                            All questions were answered, and informed consent                            was obtained. Prior Anticoagulants: The patient has                            taken no previous anticoagulant or antiplatelet                            agents. ASA Grade Assessment: II - A patient with                            mild systemic disease. After reviewing the risks                            and benefits, the patient was deemed in  satisfactory condition to undergo the procedure.                           After obtaining informed consent, the colonoscope                            was passed under direct vision. Throughout the                            procedure, the patient's blood pressure, pulse, and                            oxygen saturations were monitored continuously. The                             Colonoscope was introduced through the anus and                            advanced to the the terminal ileum, with                            identification of the appendiceal orifice and IC                            valve. The colonoscopy was performed without                            difficulty. The patient tolerated the procedure                            well. The quality of the bowel preparation was                            good. The terminal ileum, ileocecal valve,                            appendiceal orifice, and rectum were photographed. Scope In: 1:48:59 PM Scope Out: 2:08:58 PM Scope Withdrawal Time: 0 hours 15 minutes 16 seconds  Total Procedure Duration: 0 hours 19 minutes 59 seconds  Findings:                 The perianal and digital rectal examinations were                            normal.                           Multiple small and large-mouthed diverticula were                            found in the sigmoid colon, descending colon,                            transverse colon, hepatic flexure and ascending  colon. There was no evidence of diverticular                            bleeding.                           There was evidence of a prior end-to-end                            colo-rectal anastomosis in the recto-sigmoid colon.                            This was patent and was characterized by healthy                            appearing mucosa and an intact staple line. The                            anastomosis was traversed.                           Non-bleeding internal hemorrhoids were found during                            retroflexion. The hemorrhoids were small. Complications:            No immediate complications. Estimated Blood Loss:     Estimated blood loss: none. Impression:               - Moderate diverticulosis in the sigmoid colon, in                            the descending colon, in the transverse colon, at                             the hepatic flexure and in the ascending colon.                            There was no evidence of diverticular bleeding.                           - Patent end-to-end colo-rectal anastomosis,                            characterized by healthy appearing mucosa and an                            intact staple line.                           - Non-bleeding internal hemorrhoids.                           - No specimens collected. Recommendation:           - Patient has a contact number available for  emergencies. The signs and symptoms of potential                            delayed complications were discussed with the                            patient. Return to normal activities tomorrow.                            Written discharge instructions were provided to the                            patient.                           - Resume previous diet.                           - Continue present medications.                           - Repeat colonoscopy in 3 years for surveillance. Mauri Pole, MD 08/24/2017 2:13:41 PM This report has been signed electronically.

## 2017-08-24 NOTE — Progress Notes (Signed)
Report to PACU, RN, vss, BBS= Clear.  

## 2017-08-27 ENCOUNTER — Telehealth: Payer: Self-pay

## 2017-08-27 NOTE — Telephone Encounter (Signed)
  Follow up Call-  Call back number 08/24/2017 08/24/2016 08/04/2015  Post procedure Call Back phone  # [phone] hm  Permission to leave phone message Yes Yes Yes  Some recent data might be hidden     Patient questions:  Do you have a fever, pain , or abdominal swelling? No. Pain Score  0 *  Have you tolerated food without any problems? Yes.    Have you been able to return to your normal activities? Yes.    Do you have any questions about your discharge instructions: Diet   No. Medications  No. Follow up visit  No.  Do you have questions or concerns about your Care? No.  Actions: * If pain score is 4 or above: No action needed, pain <4.

## 2017-08-30 ENCOUNTER — Other Ambulatory Visit (HOSPITAL_BASED_OUTPATIENT_CLINIC_OR_DEPARTMENT_OTHER): Payer: Federal, State, Local not specified - PPO

## 2017-08-30 DIAGNOSIS — C20 Malignant neoplasm of rectum: Secondary | ICD-10-CM

## 2017-08-30 DIAGNOSIS — Z85048 Personal history of other malignant neoplasm of rectum, rectosigmoid junction, and anus: Secondary | ICD-10-CM

## 2017-08-30 LAB — CBC WITH DIFFERENTIAL/PLATELET
BASO%: 0.9 % (ref 0.0–2.0)
Basophils Absolute: 0.1 10*3/uL (ref 0.0–0.1)
EOS ABS: 0.2 10*3/uL (ref 0.0–0.5)
EOS%: 3 % (ref 0.0–7.0)
HCT: 40.3 % (ref 34.8–46.6)
HGB: 13.6 g/dL (ref 11.6–15.9)
LYMPH%: 17.5 % (ref 14.0–49.7)
MCH: 30.4 pg (ref 25.1–34.0)
MCHC: 33.7 g/dL (ref 31.5–36.0)
MCV: 90.2 fL (ref 79.5–101.0)
MONO#: 0.4 10*3/uL (ref 0.1–0.9)
MONO%: 7.1 % (ref 0.0–14.0)
NEUT%: 71.5 % (ref 38.4–76.8)
NEUTROS ABS: 4 10*3/uL (ref 1.5–6.5)
Platelets: 288 10*3/uL (ref 145–400)
RBC: 4.47 10*6/uL (ref 3.70–5.45)
RDW: 12.9 % (ref 11.2–14.5)
WBC: 5.6 10*3/uL (ref 3.9–10.3)
lymph#: 1 10*3/uL (ref 0.9–3.3)

## 2017-08-30 LAB — COMPREHENSIVE METABOLIC PANEL
ALBUMIN: 4.4 g/dL (ref 3.5–5.0)
ALK PHOS: 99 U/L (ref 40–150)
ALT: 22 U/L (ref 0–55)
AST: 24 U/L (ref 5–34)
Anion Gap: 8 mEq/L (ref 3–11)
BILIRUBIN TOTAL: 0.54 mg/dL (ref 0.20–1.20)
BUN: 12.2 mg/dL (ref 7.0–26.0)
CALCIUM: 9.3 mg/dL (ref 8.4–10.4)
CO2: 29 mEq/L (ref 22–29)
CREATININE: 0.8 mg/dL (ref 0.6–1.1)
Chloride: 104 mEq/L (ref 98–109)
EGFR: >60 mL/min/{1.73_m2} (ref >60)
Glucose: 90 mg/dl (ref 70–140)
Potassium: 4 mEq/L (ref 3.5–5.1)
Sodium: 141 mEq/L (ref 136–145)
TOTAL PROTEIN: 7.2 g/dL (ref 6.4–8.3)

## 2017-08-30 LAB — CEA (IN HOUSE-CHCC): CEA (CHCC-IN HOUSE): 1.69 ng/mL (ref 0.00–5.00)

## 2017-09-03 DIAGNOSIS — Z1231 Encounter for screening mammogram for malignant neoplasm of breast: Secondary | ICD-10-CM | POA: Diagnosis not present

## 2017-09-04 ENCOUNTER — Telehealth: Payer: Self-pay | Admitting: *Deleted

## 2017-09-04 NOTE — Telephone Encounter (Signed)
TCT patient with lab results from 08/30/17. No answer but was able to leave VM message for pt to call back. All labs including CEA were within normal limits. Left [place] phone  # [phone] for pt to call back

## 2017-09-10 ENCOUNTER — Telehealth: Payer: Self-pay | Admitting: *Deleted

## 2017-09-10 NOTE — Telephone Encounter (Signed)
-----   Message from Truitt Merle, MD sent at 09/09/2017  7:47 PM EST ----- Please let pt know her recent CEA was normal, thanks   Truitt Merle  09/09/2017

## 2017-09-10 NOTE — Telephone Encounter (Signed)
Called pt & informed of normal CEA result per Dr Burr Medico.  Pt had already received report but appreciated call.

## 2017-10-25 IMAGING — MG MM SCREENING BREAST TOMO BILATERAL
5 series · 6 of 13 positions shown · non-contrast
Comparison: Previous exam(s).

CLINICAL DATA: Screening.

EXAM:
DIGITAL SCREENING BILATERAL MAMMOGRAM WITH 3D TOMO WITH CAD

[L CC]
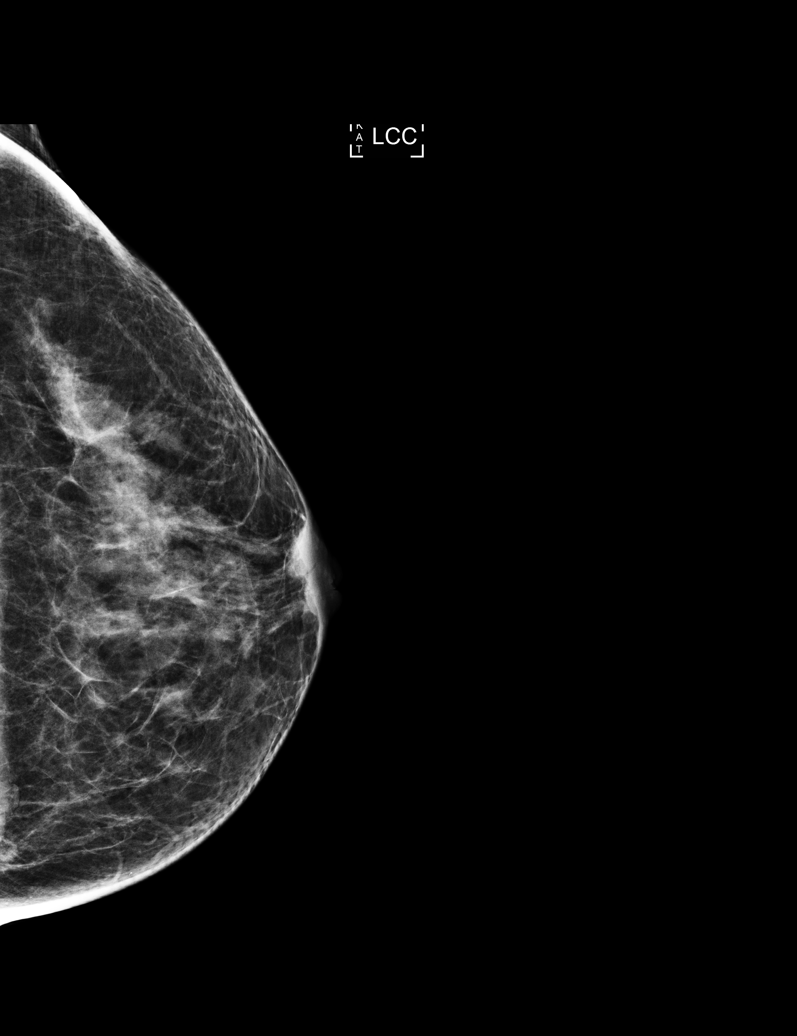

[L MLO]
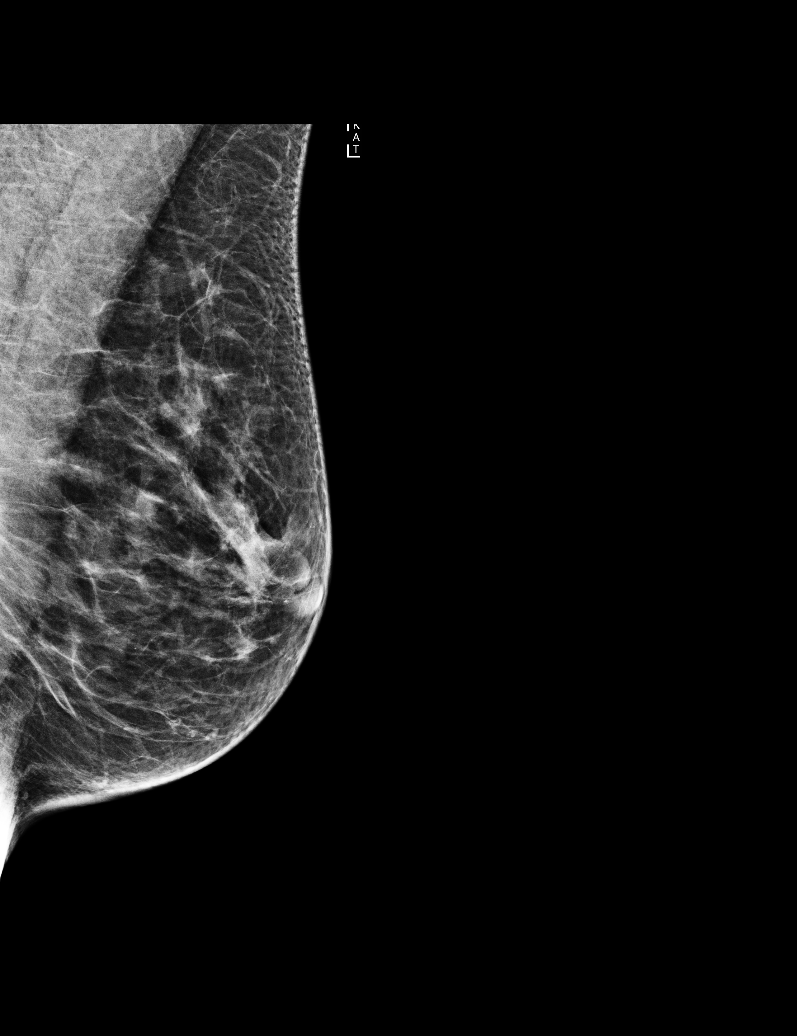

[R MLO]
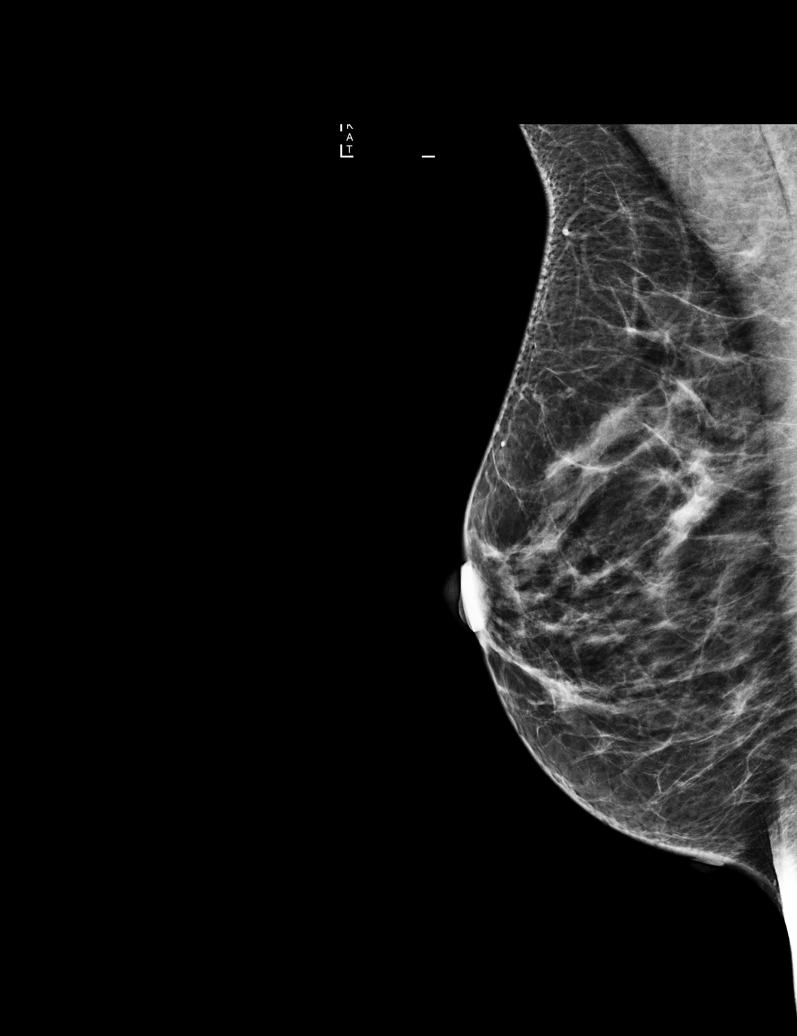

[L MLO tomo · 2 of 50 frames shown]
[frame 17/50]
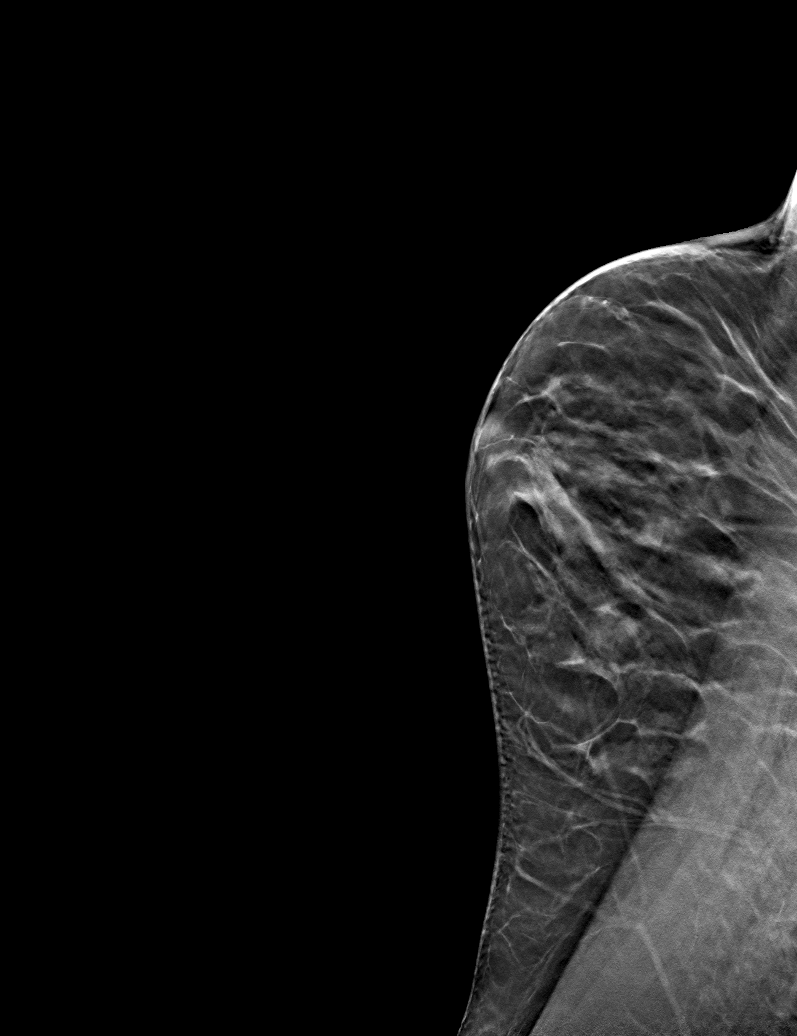
[frame 25/50]
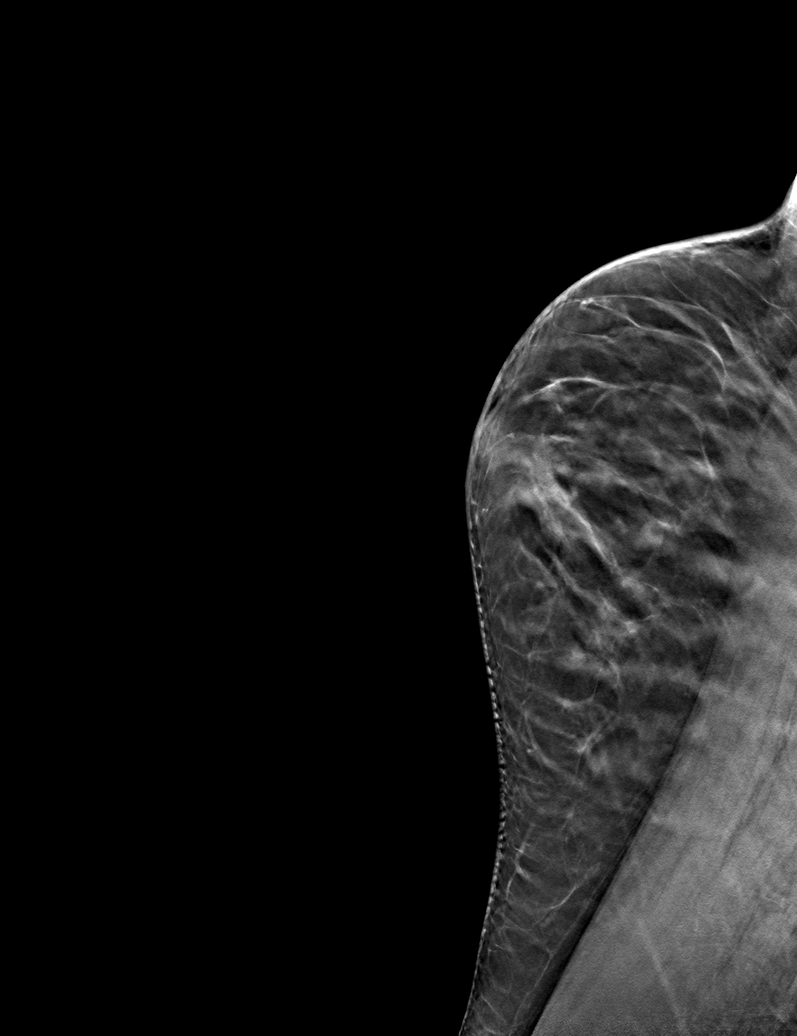

[R MLO tomo · tomo slice 24/47.0]
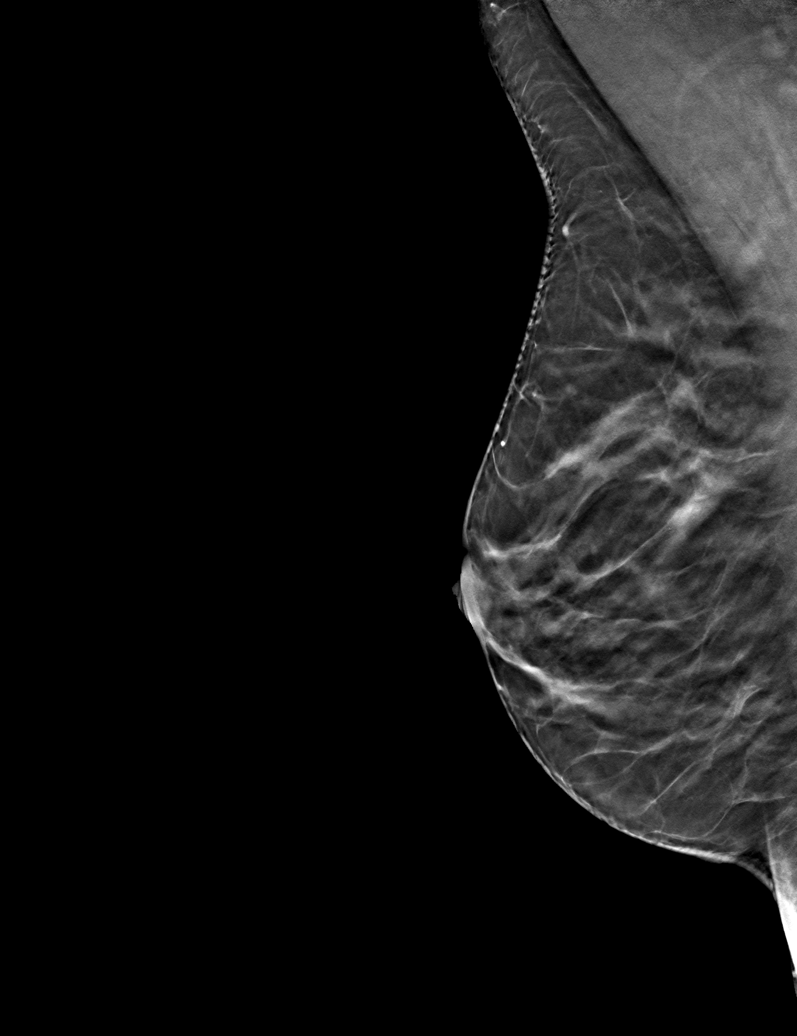

[6 of 13 positions shown; findings below may reference images not displayed]

ACR Breast Density Category c: The breast tissue is heterogeneously
dense, which may obscure small masses.
FINDINGS: There are no findings suspicious for malignancy. Images were
processed with CAD.
IMPRESSION: No mammographic evidence of malignancy. A result letter of this
screening mammogram will be mailed directly to the patient.

RECOMMENDATION:
Screening mammogram in one year. (Code:OA-G-1SS)

BI-RADS CATEGORY  1: Negative.

## 2017-11-26 DIAGNOSIS — Z85048 Personal history of other malignant neoplasm of rectum, rectosigmoid junction, and anus: Secondary | ICD-10-CM | POA: Diagnosis not present

## 2017-12-21 DIAGNOSIS — L821 Other seborrheic keratosis: Secondary | ICD-10-CM | POA: Diagnosis not present

## 2017-12-21 DIAGNOSIS — D2261 Melanocytic nevi of right upper limb, including shoulder: Secondary | ICD-10-CM | POA: Diagnosis not present

## 2017-12-21 DIAGNOSIS — L570 Actinic keratosis: Secondary | ICD-10-CM | POA: Diagnosis not present

## 2017-12-21 DIAGNOSIS — Z85820 Personal history of malignant melanoma of skin: Secondary | ICD-10-CM | POA: Diagnosis not present

## 2017-12-21 DIAGNOSIS — Z85828 Personal history of other malignant neoplasm of skin: Secondary | ICD-10-CM | POA: Diagnosis not present

## 2017-12-21 DIAGNOSIS — D2272 Melanocytic nevi of left lower limb, including hip: Secondary | ICD-10-CM | POA: Diagnosis not present

## 2018-03-27 DIAGNOSIS — L821 Other seborrheic keratosis: Secondary | ICD-10-CM | POA: Diagnosis not present

## 2018-03-27 DIAGNOSIS — L570 Actinic keratosis: Secondary | ICD-10-CM | POA: Diagnosis not present

## 2018-03-27 DIAGNOSIS — D485 Neoplasm of uncertain behavior of skin: Secondary | ICD-10-CM | POA: Diagnosis not present

## 2018-03-27 DIAGNOSIS — D2261 Melanocytic nevi of right upper limb, including shoulder: Secondary | ICD-10-CM | POA: Diagnosis not present

## 2018-03-27 NOTE — Progress Notes (Signed)
[facility]  [phone] Fax:([phone]) [phone]  Clinic follow up Note   Patient Care Team: Kathyrn Lass, MD as PCP - General (Family Medicine) [name], MD as Consulting Physician (General Surgery) Kyung Rudd, MD as Consulting Physician (Radiation Oncology) Mauri Pole, MD as Consulting Physician (Gastroenterology)   Date of Service:  03/29/2018   CHIEF COMPLAINTS:  Follow up rectal cancer   Oncology History   Rectal adenocarcinoma [facility])   Staging form: Colon and Rectum, AJCC 7th Edition     Clinical stage from 08/04/2015: Stage IIA (T3, N0, M0) - Signed by Truitt Merle, MD on 08/31/2015       Rectal adenocarcinoma ([place])   08/04/2015 Initial Biopsy    rectal mass adenocarcinoma, descending colon polyp hyperplastic polyp.       08/04/2015 Procedure    5 cm rectal mass starting at 10 cm from anal verge extending to 15 cm, 24m sigmoid polyp was removed       08/06/2015 Imaging    CT chest, abdomen and pelvis w contrast showed a possible area of rectal wall thickening aterior to the distal sacrum, no adenopathy or distant metastasis       08/13/2015 Initial Diagnosis    [place]      08/19/2015 Procedure    low EUS showed a T3N0 rectal mass       08/31/2015 -  Radiation Therapy    radiation to rectal adenocarcinoma       08/31/2015 - 10/08/2015 Chemotherapy    Capecitabine '1500mg'$  q12h with concurrent radiation, held after 09/29/2015 due to transaminitis       11/26/2015 Surgery    Rectosigmoid segmental resection, surgical margins were negative.      11/26/2015 Pathology Results    Rectosigmoid segmental resection showed invasive colonic adenocarcinoma, grade 2, T1, margins were negative, 15 lymph nodes were negative. LVI (-), PNI (-)      08/24/2016 Imaging     Colonscopy  Performed by Dr. NSilverio DecampImpression:  - Diverticulosis in the sigmoid colon - one 1 mm polyp in the sigmoid colon, removed with a cold biopsy  forceps. Resceted and retrieeved - non-bleeding interanl hemorrhoids - anal papilla(e) were hypertrophied - the examination was otherwise normal        09/11/2016 Imaging    CT CAP w contrast 09/11/2016 IMPRESSION: 1. No evidence of metastatic rectal cancer within the chest, abdomen or pelvis. 2. Interval postsurgical changes in the rectum and anterior abdominal wall without apparent complication. 3. Tiny renal lesions bilaterally, too small to optimally characterize, although likely cysts.      08/24/2017 Procedure    Colonoscopy by Dr. NSilverio Decamp12/28/18  IMPRESSION - Moderate diverticulosis in the sigmoid colon, in the descending colon, in the transverse colon, at the hepatic flexure and in the ascending colon. There was no evidence of diverticular bleeding. - Patent end-to-end colo-rectal anastomosis, characterized by healthy appearing mucosa and an intact staple line. - Non-bleeding internal hemorrhoids. - No specimens collected.       HISTORY OF PRESENTING ILLNESS (08/13/2015):  JSignora [id number].o. female is here because of newly diagnosed rectal adenocarcinoma. She is accompanied by her husband to our multidisciplinary GI clinic today.  She had a colonoscopy in 05/2013 by Dr. BOlevia Percheswhich showed a 3-537msessile rectal polyp, which was removed, pathology revealed tubular adenoma. She has had intermittent rectal bleeding since Oct 2016, mild, she had mild constipation, no pain, nausea or change of appetite, no weight loss. She  called Dr. Nichola Sizer office, who has recently retired, and was seen by Dr. Silverio Decamp. She underwent colonoscopy on 08/04/2015, which showed a 5 cm rectal mass starting at a 10 cm from an approach extending to 15 cm, and a 3 mm sigmoid polyp which was removed. The rectal mass biopsy showed adenocarcinoma. CT of the chest abdomen and pelvis with contrast was performed on 08/06/2015, which showed no adenopathy or distant metastasis.  She otherwise feels  well, no other complains. She is a homemaker, also works as a English as a second language teacher. She eats healthy diet, and walks 5 miles a day, very physically active.   She had a early stage skin melanoma on her right arm, was removed a few years ago. She also has a basal cell skin cancer on her face, will need a resection soon. She otherwise is very healthy. No family history of colon cancer or other malignancy.   CURRENT THERAPY: Surveillance  INTERIM HISTORY  Molly Vang returns for follow up for her rectal cancer. She was last seen by me 1 year ago. In interim she had a repeat colonoscopy in 07/2017. She presents to the clinic today by herself. She notes she is doing well and notes her HTN was elevated today because she is preparing for her family's arrival. She denies any new pain, blood in stool or changes overall. She does not have trouble with urination or having bowel movements so she has not been to Pelvic floor PT. She remains very active.    MEDICAL HISTORY:  Past Medical History:  Diagnosis Date  . Allergy   . Cancer ([place])    melanoma right bicep removed  . Crepitus of cervical spine    face, neck, arms; MD and anesthesia dispute the cause  . Rectal cancer ([place]) 2016  . SVT (supraventricular tachycardia) ([place]) 06/11/2013   svt on 10/6// ablation on 10/15    SURGICAL HISTORY: Past Surgical History:  Procedure Laterality Date  . COLONOSCOPY    . EUS N/A 08/19/2015   Procedure: LOWER ENDOSCOPIC ULTRASOUND (EUS);  Surgeon: Milus Banister, MD;  Location: [facility];  Service: Endoscopy;  Laterality: N/A;  . NM MYOCAR PERF WALL MOTION  12/14/2010   normal  . OVARIAN CYST SURGERY  1993  . SUPRAVENTRICULAR TACHYCARDIA ABLATION  06/11/2013  . SUPRAVENTRICULAR TACHYCARDIA ABLATION N/A 06/11/2013   Procedure: SUPRAVENTRICULAR TACHYCARDIA ABLATION;  Surgeon: Evans Lance, MD;  Location: [facility] CATH LAB;  Service: Cardiovascular;  Laterality: N/A;  . TONSILLECTOMY  1960  . TUBAL LIGATION    . US  ECHOCARDIOGRAPHY  11/14/11   trace TR,MR  . WISDOM TOOTH EXTRACTION    . XI ROBOTIC ASSISTED LOWER ANTERIOR RESECTION N/A 11/26/2015   Procedure: XI ROBOTIC ASSISTED LOWER ANTERIOR RESECTION WITH RIGID PROCTOSCOPY;  Surgeon: [name], MD;  Location: [facility] ORS;  Service: General;  Laterality: N/A;    SOCIAL HISTORY: Social History   Social History  . Marital Status: Married    Spouse Name: N/A  . Number of Children: 2   . Years of Education: N/A   Occupational History  . She is a Materials engineer    Social History Main Topics  . Smoking status: Never Smoker   . Smokeless tobacco: Never Used  . Alcohol Use: Yes     Comment: 06/11/2013 "beer or glass of wine maybe once/month"  . Drug Use: No  . Sexual Activity: Yes   Other Topics Concern  . Not on file   Social History Narrative    FAMILY  HISTORY: Family History  Problem Relation Age of Onset  . Hypertension Father   . Hyperlipidemia Father   . Hypertension Mother   . Hyperlipidemia Brother   . Colon cancer Neg Hx   . Stomach cancer Neg Hx   . Esophageal cancer Neg Hx   . Rectal cancer Neg Hx     ALLERGIES:  has No Known Allergies.  MEDICATIONS:  Current Outpatient Medications  Medication Sig Dispense Refill  . aspirin EC 81 MG tablet Take 81 mg by mouth daily. Reported on 10/08/2015    . BIOTIN 5000 PO Take 1 tablet by mouth daily.    . Calcium Carbonate-Vitamin D (CALCIUM 600 + D PO) Take 1 tablet by mouth daily. Vit. D3 is 400iu    . chlorhexidine (PERIDEX) 0.12 % solution Use as directed 15 mLs in the mouth or throat daily.    . chlorpheniramine (CHLOR-TRIMETON) 4 MG tablet Take 4 mg by mouth 2 (two) times daily. Reported on 09/03/2015    . cholecalciferol (VITAMIN D) 1000 units tablet Take 1,000 Units by mouth daily.    . fish oil-omega-3 fatty acids 1000 MG capsule Take 1 g by mouth daily. EPA '570mg'$   And DHA '130mg'$     . Multiple Vitamins-Minerals (MULTIVITAMIN WOMEN 50+ PO) Take 1 tablet by mouth daily.    Vladimir Faster Glycol-Propyl Glycol (SYSTANE) 0.4-0.3 % SOLN Apply 1 drop to eye every evening.    . zolpidem (AMBIEN) 10 MG tablet Take 5 mg by mouth at bedtime as needed for sleep.   2   No current facility-administered medications for this visit.     REVIEW OF SYSTEMS:   Constitutional: Denies fevers, chills or abnormal night sweats Eyes: Denies blurriness of vision, double vision or watery eyes Ears, nose, mouth, throat, and face: Denies mucositis or sore throat Respiratory: Denies cough, dyspnea or wheezes Cardiovascular: Denies palpitation, chest discomfort or lower extremity swelling Gastrointestinal:  Denies nausea, heartburn or change in bowel habits Skin: Denies abnormal skin rashes  Lymphatics: Denies new lymphadenopathy or easy bruising Neurological:Denies numbness, tingling or new weaknesses Behavioral/Psych: Mood is stable, no new changes  All other systems were reviewed with the patient and are negative.  PHYSICAL EXAMINATION:  ECOG PERFORMANCE STATUS: 0 - Asymptomatic  Vitals:   03/29/18 0934  BP: 125/88  Pulse: 78  Resp: 17  Temp: 98.5 F (36.9 C)  SpO2: 98%   Filed Weights   03/29/18 0934  Weight: 139 lb 14.4 oz (63.5 kg)    GENERAL:alert, no distress and comfortable SKIN: skin color, texture, turgor are normal, no rashes or significant lesions EYES: normal, conjunctiva are pink and non-injected, sclera clear OROPHARYNX:no exudate, no erythema and lips, buccal mucosa, and tongue normal  NECK: supple, thyroid normal size, non-tender, without nodularity LYMPH:  no palpable lymphadenopathy in the cervical, axillary or inguinal LUNGS: clear to auscultation and percussion with normal breathing effort HEART: regular rate & rhythm and no murmurs and no lower extremity edema ABDOMEN:abdomen soft, non-tender and normal bowel sounds. Laparoscopic surgical incisions are well-healed. Rectal exam was deferred, she is going to have a colonoscopy next  week. Musculoskeletal:no cyanosis of digits and no clubbing  PSYCH: alert & oriented x 3 with fluent speech NEURO: no focal motor/sensory deficits RECTAL: negative, no blood on glove, no tenderness. (+) Has external and large internal hemorrhoids, soft nontender    LABORATORY DATA:  I have reviewed the data as listed CBC Latest Ref Rng & Units 03/28/2018 08/30/2017 03/28/2017  WBC 3.9 -  10.3 K/uL 5.6 5.6 5.5  Hemoglobin 11.6 - 15.9 g/dL 13.0 13.6 13.4  Hematocrit 34.8 - 46.6 % 38.5 40.3 39.5  Platelets 145 - 400 K/uL 251 288 273    CMP Latest Ref Rng & Units 03/28/2018 08/30/2017 03/28/2017  Glucose 70 - 99 mg/dL 91 90 130  BUN 8 - 23 mg/dL 14 12.2 10.5  Creatinine 0.44 - 1.00 mg/dL 0.81 0.8 0.8  Sodium 135 - 145 mmol/L 141 141 139  Potassium 3.5 - 5.1 mmol/L 3.8 4.0 3.7  Chloride 98 - 111 mmol/L 105 - -  CO2 22 - 32 mmol/L '29 29 25  '$ Calcium 8.9 - 10.3 mg/dL 9.3 9.3 9.4  Total Protein 6.5 - 8.1 g/dL 7.0 7.2 7.1  Total Bilirubin 0.3 - 1.2 mg/dL 0.5 0.54 0.76  Alkaline Phos 38 - 126 U/L 88 99 91  AST 15 - 41 U/L '24 24 27  '$ ALT 0 - 44 U/L '20 22 21    '$ PATHOLOGY REPORT: Diagnosis 11/26/2015 1. Colon, segmental resection for tumor, rectosigmoid INVASIVE COLONIC ADENOCARCINOMA, GRADE 2 THE TUMOR INVADES SUBMUCOSA MARGINS OF RESECTION ARE NEGATIVE FOR TUMOR FIFTEEN BENIGN LYMPH NODES (0/15) 2. Colon, resection margin (donut), final distal BENIGN COLONIC TISSUE NEGATIVE FOR MALIGNANCY  Microscopic Comment 1. COLON AND RECTUM (INCLUDING TRANS-ANAL RESECTION): Specimen: Rectosigmoid colon Procedure: Segmental resection Tumor site: rectum Specimen integrity: Intact Macroscopic intactness of mesorectum: Not applicable: x Complete: NA Near complete: NA Incomplete: NA Cannot be determined (specify): NA Macroscopic tumor perforation: Submucosa Invasive tumor: Maximum size: 0.4 cm Histologic type(s): Adenocarcinoma Histologic grade and differentiation: G2 G1: well differentiated/low  grade G2: moderately differentiated/low grade G3: poorly differentiated/high grade G4: undifferentiated/high grade Type of polyp in which invasive carcinoma arose: Tubular adenoma Microscopic extension of invasive tumor: Submucosa Lymph-Vascular invasion: Negative Peri-neural invasion: Negative Tumor deposit(s) (discontinuous extramural extension): Negative Resection margins: Proximal margin: 7.1 cm Distal margin: 1.3 Circumferential (radial) (posterior ascending, posterior descending; lateral and posterior mid-rectum; and entire lower 1/3 rectum): 3.5 cm Mesenteric margin (sigmoid and transverse): NA Distance closest margin (if all above margins negative): 1.7 cm Trans-anal resection margins only: Deep margin: NA Mucosal Margin: NA Distance closest mucosal margin (if negative): NA Treatment effect (neo-adjuvant therapy): Present Additional polyp(s): Negative Non-neoplastic findings: unremarkable Lymph nodes: number examined 15; number positive: 0 Pathologic Staging: ypT1, ypN0, Mx Ancillary studies: per request   RADIOGRAPHIC STUDIES: I have personally reviewed the radiological images as listed and agreed with the findings in the report. No results found.    PROCEDURES  Colonoscopy by Dr. Silverio Decamp 08/24/17  IMPRESSION - Moderate diverticulosis in the sigmoid colon, in the descending colon, in the transverse colon, at the hepatic flexure and in the ascending colon. There was no evidence of diverticular bleeding. - Patent end-to-end colo-rectal anastomosis, characterized by healthy appearing mucosa and an intact staple line. - Non-bleeding internal hemorrhoids. - No specimens collected.   Colonoscopy 08/24/2016 Dr. Silverio Decamp Impression:  - Diverticulosis in the sigmoid colon - one 1 mm polyp in the sigmoid colon, removed with a cold biopsy forceps. Resected and retrieved - non-bleeding internal hemorrhoids - anal papilla(e) were hypertrophied - the examination was  otherwise normal  COLONOSCOPY 08/04/2015 Dr. Silverio Decamp  COLON FINDINGS:  5 cm rectal mass starting at 10 cm from anal verge extending to 15 cm, friable ulcerated mucosa concerning for malignancy, multiple random biopsies were obtained. 3 mm polyp in sigmoid colon removed by cold biopsy forceps. Diverticulosis. Retroflexed views revealed internal hemorrhoids. The time to cecum = 0.7 Withdrawal time =  14.0 The scope was withdrawn and the procedure completed.  COMPLICATIONS: There were no immediate complications.  ENDOSCOPIC IMPRESSION: 5 cm rectal mass starting at 10 cm from anal verge extending to 15 cm, friable ulcerated mucosa concerning for malignancy, multiple random biopsies were obtained. 3 mm polyp in sigmoid colon removed by cold biopsy forceps. Diverticulosis RECOMMENDATIONS: 1. Await pathology results 2. CT chest, abdomen and pelvis with contrast Referral to surgery based on pathology and imaging results Repeat colonoscopy in 6-12 months eSigned: Harl Bowie, MD 08/04/15  EUS by Dr. Ardis Hughs  ENDOSCOPIC IMPRESSION: 08/19/2015 1. Non circumferential, 3cm long uT3N0 (Stage IIa) rectal adenocarcinoma with distal edge located 8cm from the anal verge. The lateral edges of the tumor were labeled with submucosal injection of SPOT following EUS examination. 2. The "asymmetric 3.2 x 2.6 cm soft tissue thickening involving the posterior right wall of the distal rectum at the anal verge (image 120) noted by CT scan was located. It is distinct from the malignant mass described above, was vaguely bordered and I suspect this is gynecologic in origin.  RECOMMENDATIONS: She will likely benefit from neoadjuvant chemo/XRT. Prefer more information about the soft tissue described in #2 above and will order pelvic ultrasound. Will communicate these results with Dr. Lisbeth Renshaw, Derrick Ravel, [place] CAP w contrast 09/11/2016 IMPRESSION: 1. No evidence of metastatic rectal cancer within the chest,  abdomen or pelvis. 2. Interval postsurgical changes in the rectum and anterior abdominal wall without apparent complication. 3. Tiny renal lesions bilaterally, too small to optimally characterize, although likely cysts.  ASSESSMENT & PLAN:  62 y.o.  Caucasian female, history of superficial skin melanoma and basal cell carcinoma, otherwise healthy and fit, presented with rectal bleeding.   1. Rectal adenocarcinoma, cT3N0M0, ypT1N0M0, MMR normal  -I previously reviewed her colonoscopy finding and the surgical biopsy results in detail with her and her husband. -her EUS showed a T3N0 disease -She has completed neoadjuvant chemotherapy and radiation, tolerated well overall. Her hematochezia resolved after the first few weeks of radiation. -Her Xeloda was held for last 7 days of radiation due to elevated liver enzymes -She has had excellent response to neoadjuvant chemotherapy and radiation, surgical path reviewed a T1 residual disease, lymph nodes were negative. -We previously discussed that standard therapy for T3 rectal cancer is adjuvant chemotherapy. However she is very reluctant to have more chemotherapy, and she has had very good response to neoadjuvant chemotherapy and radiation, the residual disease is T1, I think skipping adjuvant chemotherapy and start surveillance is also reasonable. -I reviewed her surveillance CT scan images and findings from January 2018 in detail with patient. Includes incidental findings. No evidence of recurrence. -her tumor MMR was normal, no evidence of Lynch syndrome.  -She is over 2.5 years since diagnosis. She will proceed with her last surveillance CT scan in 07/2018.  -Her 07/2017 colonoscopy showed diffuse diverticulosis and non bleeding internal hemorrhoids, over all benign.  -She is clinically doing well, physical exam and rectal exam was unremarkable, lab results reviewed, her CBC, CEA and CMP are normal. No clinical concern for recurrence. -Continue  surveillance. She will continue to see Dr. Marcello Moores and me alternatively her visits every 6 months.  -I encouraged her to continue age appropriate cancer screenings.     Plan -CT CPA with contrast at the end of Dec 2019  -f/u in one year with lab a few days before    Pt had multiple questions, and I answered to her satisfaction. She knows to  call us if she has concerns before your next appointment.  I spent 20 minutes counseling the patient face to face. The total time spent in the appointment was 25 minutes and more than 50% was on counseling.  Oneal Deputy, am acting as scribe for Truitt Merle, MD.   I have reviewed the above documentation for accuracy and completeness, and I agree with the above.    Truitt Merle  03/29/2018

## 2018-03-28 ENCOUNTER — Inpatient Hospital Stay: Payer: Federal, State, Local not specified - PPO | Attending: Hematology

## 2018-03-28 DIAGNOSIS — Z9221 Personal history of antineoplastic chemotherapy: Secondary | ICD-10-CM | POA: Diagnosis not present

## 2018-03-28 DIAGNOSIS — K648 Other hemorrhoids: Secondary | ICD-10-CM | POA: Insufficient documentation

## 2018-03-28 DIAGNOSIS — C20 Malignant neoplasm of rectum: Secondary | ICD-10-CM

## 2018-03-28 DIAGNOSIS — Z923 Personal history of irradiation: Secondary | ICD-10-CM | POA: Insufficient documentation

## 2018-03-28 DIAGNOSIS — I10 Essential (primary) hypertension: Secondary | ICD-10-CM | POA: Insufficient documentation

## 2018-03-28 DIAGNOSIS — Z85048 Personal history of other malignant neoplasm of rectum, rectosigmoid junction, and anus: Secondary | ICD-10-CM | POA: Diagnosis not present

## 2018-03-28 DIAGNOSIS — K5730 Diverticulosis of large intestine without perforation or abscess without bleeding: Secondary | ICD-10-CM | POA: Insufficient documentation

## 2018-03-28 DIAGNOSIS — Z85820 Personal history of malignant melanoma of skin: Secondary | ICD-10-CM | POA: Diagnosis not present

## 2018-03-28 LAB — COMPREHENSIVE METABOLIC PANEL
ALBUMIN: 4.3 g/dL (ref 3.5–5.0)
ALK PHOS: 88 U/L (ref 38–126)
ALT: 20 U/L (ref 0–44)
ANION GAP: 7 (ref 5–15)
AST: 24 U/L (ref 15–41)
BUN: 14 mg/dL (ref 8–23)
CHLORIDE: 105 mmol/L (ref 98–111)
CO2: 29 mmol/L (ref 22–32)
CREATININE: 0.81 mg/dL (ref 0.44–1.00)
Calcium: 9.3 mg/dL (ref 8.9–10.3)
GFR calc Af Amer: >60 mL/min (ref >60)
GFR calc non Af Amer: >60 mL/min (ref >60)
Glucose, Bld: 91 mg/dL (ref 70–99)
POTASSIUM: 3.8 mmol/L (ref 3.5–5.1)
SODIUM: 141 mmol/L (ref 135–145)
TOTAL PROTEIN: 7 g/dL (ref 6.5–8.1)
Total Bilirubin: 0.5 mg/dL (ref 0.3–1.2)

## 2018-03-28 LAB — CBC WITH DIFFERENTIAL/PLATELET
BASOS ABS: 0.1 10*3/uL (ref 0.0–0.1)
Basophils Relative: 1 %
EOS ABS: 0.1 10*3/uL (ref 0.0–0.5)
Eosinophils Relative: 2 %
HCT: 38.5 % (ref 34.8–46.6)
HEMOGLOBIN: 13 g/dL (ref 11.6–15.9)
LYMPHS ABS: 1 10*3/uL (ref 0.9–3.3)
LYMPHS PCT: 18 %
MCH: 30.2 pg (ref 25.1–34.0)
MCHC: 33.8 g/dL (ref 31.5–36.0)
MCV: 89.3 fL (ref 79.5–101.0)
Monocytes Absolute: 0.6 10*3/uL (ref 0.1–0.9)
Monocytes Relative: 11 %
NEUTROS PCT: 68 %
Neutro Abs: 3.8 10*3/uL (ref 1.5–6.5)
Platelets: 251 10*3/uL (ref 145–400)
RBC: 4.31 MIL/uL (ref 3.70–5.45)
RDW: 12.4 % (ref 11.2–14.5)
WBC: 5.6 10*3/uL (ref 3.9–10.3)

## 2018-03-28 LAB — CEA (IN HOUSE-CHCC): CEA (CHCC-In House): 2.61 ng/mL (ref 0.00–5.00)

## 2018-03-29 ENCOUNTER — Telehealth: Payer: Self-pay | Admitting: Hematology

## 2018-03-29 ENCOUNTER — Inpatient Hospital Stay: Payer: Federal, State, Local not specified - PPO | Admitting: Hematology

## 2018-03-29 ENCOUNTER — Encounter: Payer: Self-pay | Admitting: Hematology

## 2018-03-29 ENCOUNTER — Other Ambulatory Visit: Payer: Federal, State, Local not specified - PPO

## 2018-03-29 VITALS — BP 125/88 | HR 78 | Temp 98.5°F | Resp 17 | Ht 67.0 in | Wt 139.9 lb

## 2018-03-29 DIAGNOSIS — Z923 Personal history of irradiation: Secondary | ICD-10-CM

## 2018-03-29 DIAGNOSIS — K5730 Diverticulosis of large intestine without perforation or abscess without bleeding: Secondary | ICD-10-CM

## 2018-03-29 DIAGNOSIS — K648 Other hemorrhoids: Secondary | ICD-10-CM | POA: Diagnosis not present

## 2018-03-29 DIAGNOSIS — C20 Malignant neoplasm of rectum: Secondary | ICD-10-CM

## 2018-03-29 DIAGNOSIS — Z9221 Personal history of antineoplastic chemotherapy: Secondary | ICD-10-CM | POA: Diagnosis not present

## 2018-03-29 DIAGNOSIS — Z85048 Personal history of other malignant neoplasm of rectum, rectosigmoid junction, and anus: Secondary | ICD-10-CM | POA: Diagnosis not present

## 2018-03-29 DIAGNOSIS — I10 Essential (primary) hypertension: Secondary | ICD-10-CM | POA: Diagnosis not present

## 2018-03-29 DIAGNOSIS — Z85820 Personal history of malignant melanoma of skin: Secondary | ICD-10-CM | POA: Diagnosis not present

## 2018-03-29 NOTE — Telephone Encounter (Signed)
LMVM for patient regarding appointments , patient not present during scheduling per 8/2 los

## 2018-05-17 DIAGNOSIS — L821 Other seborrheic keratosis: Secondary | ICD-10-CM | POA: Diagnosis not present

## 2018-05-29 DIAGNOSIS — Z23 Encounter for immunization: Secondary | ICD-10-CM | POA: Diagnosis not present

## 2018-05-29 DIAGNOSIS — Z0000 Encounter for general adult medical examination without abnormal findings: Secondary | ICD-10-CM | POA: Diagnosis not present

## 2018-05-29 DIAGNOSIS — M859 Disorder of bone density and structure, unspecified: Secondary | ICD-10-CM | POA: Diagnosis not present

## 2018-05-29 DIAGNOSIS — Z13220 Encounter for screening for lipoid disorders: Secondary | ICD-10-CM | POA: Diagnosis not present

## 2018-08-07 ENCOUNTER — Telehealth: Payer: Self-pay | Admitting: Hematology

## 2018-08-07 NOTE — Telephone Encounter (Signed)
Called regarding 1/13

## 2018-09-05 DIAGNOSIS — Z1231 Encounter for screening mammogram for malignant neoplasm of breast: Secondary | ICD-10-CM | POA: Diagnosis not present

## 2018-09-09 ENCOUNTER — Inpatient Hospital Stay: Payer: Federal, State, Local not specified - PPO | Attending: Hematology

## 2018-09-09 ENCOUNTER — Encounter (HOSPITAL_COMMUNITY): Payer: Self-pay | Admitting: Radiology

## 2018-09-09 ENCOUNTER — Ambulatory Visit (HOSPITAL_COMMUNITY)
Admission: RE | Admit: 2018-09-09 | Discharge: 2018-09-09 | Disposition: A | Discharge status: Home / Self Care | Payer: Federal, State, Local not specified - PPO | Source: Ambulatory Visit | Attending: Hematology | Admitting: Hematology

## 2018-09-09 DIAGNOSIS — I10 Essential (primary) hypertension: Secondary | ICD-10-CM | POA: Insufficient documentation

## 2018-09-09 DIAGNOSIS — Z9221 Personal history of antineoplastic chemotherapy: Secondary | ICD-10-CM | POA: Insufficient documentation

## 2018-09-09 DIAGNOSIS — C20 Malignant neoplasm of rectum: Secondary | ICD-10-CM | POA: Diagnosis not present

## 2018-09-09 DIAGNOSIS — Z923 Personal history of irradiation: Secondary | ICD-10-CM | POA: Insufficient documentation

## 2018-09-09 DIAGNOSIS — Z85048 Personal history of other malignant neoplasm of rectum, rectosigmoid junction, and anus: Secondary | ICD-10-CM | POA: Diagnosis not present

## 2018-09-09 DIAGNOSIS — K648 Other hemorrhoids: Secondary | ICD-10-CM | POA: Diagnosis not present

## 2018-09-09 DIAGNOSIS — Z85820 Personal history of malignant melanoma of skin: Secondary | ICD-10-CM | POA: Insufficient documentation

## 2018-09-09 DIAGNOSIS — K5730 Diverticulosis of large intestine without perforation or abscess without bleeding: Secondary | ICD-10-CM | POA: Diagnosis not present

## 2018-09-09 LAB — CBC WITH DIFFERENTIAL/PLATELET
ABS IMMATURE GRANULOCYTES: 0.01 10*3/uL (ref 0.00–0.07)
Basophils Absolute: 0.1 10*3/uL (ref 0.0–0.1)
Basophils Relative: 1 %
Eosinophils Absolute: 0.2 10*3/uL (ref 0.0–0.5)
Eosinophils Relative: 4 %
HEMATOCRIT: 40.9 % (ref 36.0–46.0)
Hemoglobin: 13.7 g/dL (ref 12.0–15.0)
Immature Granulocytes: 0 %
LYMPHS ABS: 0.8 10*3/uL (ref 0.7–4.0)
Lymphocytes Relative: 16 %
MCH: 29.8 pg (ref 26.0–34.0)
MCHC: 33.5 g/dL (ref 30.0–36.0)
MCV: 89.1 fL (ref 80.0–100.0)
MONOS PCT: 10 %
Monocytes Absolute: 0.5 10*3/uL (ref 0.1–1.0)
NEUTROS ABS: 3.3 10*3/uL (ref 1.7–7.7)
Neutrophils Relative %: 69 %
Platelets: 280 10*3/uL (ref 150–400)
RBC: 4.59 MIL/uL (ref 3.87–5.11)
RDW: 12 % (ref 11.5–15.5)
WBC: 4.8 10*3/uL (ref 4.0–10.5)
nRBC: 0 % (ref 0.0–0.2)

## 2018-09-09 LAB — COMPREHENSIVE METABOLIC PANEL
ALBUMIN: 4.3 g/dL (ref 3.5–5.0)
ALK PHOS: 89 U/L (ref 38–126)
ALT: 26 U/L (ref 0–44)
ANION GAP: 8 (ref 5–15)
AST: 25 U/L (ref 15–41)
BUN: 14 mg/dL (ref 8–23)
CALCIUM: 9 mg/dL (ref 8.9–10.3)
CO2: 26 mmol/L (ref 22–32)
Chloride: 107 mmol/L (ref 98–111)
Creatinine, Ser: 0.78 mg/dL (ref 0.44–1.00)
GFR calc Af Amer: >60 mL/min (ref >60)
GFR calc non Af Amer: >60 mL/min (ref >60)
GLUCOSE: 89 mg/dL (ref 70–99)
POTASSIUM: 4 mmol/L (ref 3.5–5.1)
SODIUM: 141 mmol/L (ref 135–145)
Total Bilirubin: 0.6 mg/dL (ref 0.3–1.2)
Total Protein: 7.1 g/dL (ref 6.5–8.1)

## 2018-09-09 LAB — CEA (IN HOUSE-CHCC): CEA (CHCC-IN HOUSE): 2.64 ng/mL (ref 0.00–5.00)

## 2018-09-09 MED ORDER — IOHEXOL 300 MG/ML  SOLN
100.0000 mL | Freq: Once | INTRAMUSCULAR | Status: AC | PRN
  Administered 2018-09-09: 100 mL via INTRAVENOUS

## 2018-09-09 MED ORDER — SODIUM CHLORIDE (PF) 0.9 % IJ SOLN
INTRAMUSCULAR | Status: AC
  Filled 2018-09-09: qty 50

## 2018-09-10 ENCOUNTER — Telehealth: Payer: Self-pay

## 2018-09-10 NOTE — Telephone Encounter (Signed)
-----   Message from Truitt Merle, MD sent at 09/09/2018  8:48 PM EST ----- Please let pt know her CT results, thanks   Truitt Merle  09/09/2018

## 2018-09-10 NOTE — Telephone Encounter (Signed)
Spoke with patient regarding CT results, per Dr. Burr Medico no evidence of metastasis.  Patient had already seen the results.  Appreciated the phone call.

## 2018-09-19 DIAGNOSIS — Z01419 Encounter for gynecological examination (general) (routine) without abnormal findings: Secondary | ICD-10-CM | POA: Diagnosis not present

## 2018-09-19 DIAGNOSIS — Z6821 Body mass index (BMI) 21.0-21.9, adult: Secondary | ICD-10-CM | POA: Diagnosis not present

## 2018-10-30 DIAGNOSIS — Z85820 Personal history of malignant melanoma of skin: Secondary | ICD-10-CM | POA: Diagnosis not present

## 2018-10-30 DIAGNOSIS — Z85828 Personal history of other malignant neoplasm of skin: Secondary | ICD-10-CM | POA: Diagnosis not present

## 2018-10-30 DIAGNOSIS — L820 Inflamed seborrheic keratosis: Secondary | ICD-10-CM | POA: Diagnosis not present

## 2018-10-30 DIAGNOSIS — L821 Other seborrheic keratosis: Secondary | ICD-10-CM | POA: Diagnosis not present

## 2019-01-09 DIAGNOSIS — Z85828 Personal history of other malignant neoplasm of skin: Secondary | ICD-10-CM | POA: Diagnosis not present

## 2019-01-09 DIAGNOSIS — Z85820 Personal history of malignant melanoma of skin: Secondary | ICD-10-CM | POA: Diagnosis not present

## 2019-01-09 DIAGNOSIS — D2262 Melanocytic nevi of left upper limb, including shoulder: Secondary | ICD-10-CM | POA: Diagnosis not present

## 2019-01-09 DIAGNOSIS — D2261 Melanocytic nevi of right upper limb, including shoulder: Secondary | ICD-10-CM | POA: Diagnosis not present

## 2019-02-14 DIAGNOSIS — D485 Neoplasm of uncertain behavior of skin: Secondary | ICD-10-CM | POA: Diagnosis not present

## 2019-02-14 DIAGNOSIS — L821 Other seborrheic keratosis: Secondary | ICD-10-CM | POA: Diagnosis not present

## 2019-02-14 DIAGNOSIS — Z85828 Personal history of other malignant neoplasm of skin: Secondary | ICD-10-CM | POA: Diagnosis not present

## 2019-02-14 DIAGNOSIS — D2261 Melanocytic nevi of right upper limb, including shoulder: Secondary | ICD-10-CM | POA: Diagnosis not present

## 2019-02-14 DIAGNOSIS — Z85820 Personal history of malignant melanoma of skin: Secondary | ICD-10-CM | POA: Diagnosis not present

## 2019-03-27 ENCOUNTER — Other Ambulatory Visit: Payer: Self-pay

## 2019-03-27 ENCOUNTER — Inpatient Hospital Stay: Payer: Federal, State, Local not specified - PPO | Attending: Hematology

## 2019-03-27 DIAGNOSIS — Z85048 Personal history of other malignant neoplasm of rectum, rectosigmoid junction, and anus: Secondary | ICD-10-CM | POA: Insufficient documentation

## 2019-03-27 DIAGNOSIS — I10 Essential (primary) hypertension: Secondary | ICD-10-CM | POA: Diagnosis not present

## 2019-03-27 DIAGNOSIS — K5730 Diverticulosis of large intestine without perforation or abscess without bleeding: Secondary | ICD-10-CM | POA: Insufficient documentation

## 2019-03-27 DIAGNOSIS — C20 Malignant neoplasm of rectum: Secondary | ICD-10-CM

## 2019-03-27 DIAGNOSIS — Z923 Personal history of irradiation: Secondary | ICD-10-CM | POA: Insufficient documentation

## 2019-03-27 DIAGNOSIS — K648 Other hemorrhoids: Secondary | ICD-10-CM | POA: Insufficient documentation

## 2019-03-27 DIAGNOSIS — Z9221 Personal history of antineoplastic chemotherapy: Secondary | ICD-10-CM | POA: Insufficient documentation

## 2019-03-27 DIAGNOSIS — Z85820 Personal history of malignant melanoma of skin: Secondary | ICD-10-CM | POA: Insufficient documentation

## 2019-03-27 LAB — COMPREHENSIVE METABOLIC PANEL
ALT: 21 U/L (ref 0–44)
AST: 23 U/L (ref 15–41)
Albumin: 4.5 g/dL (ref 3.5–5.0)
Alkaline Phosphatase: 95 U/L (ref 38–126)
Anion gap: 8 (ref 5–15)
BUN: 15 mg/dL (ref 8–23)
CO2: 29 mmol/L (ref 22–32)
Calcium: 9.6 mg/dL (ref 8.9–10.3)
Chloride: 104 mmol/L (ref 98–111)
Creatinine, Ser: 0.81 mg/dL (ref 0.44–1.00)
GFR calc Af Amer: >60 mL/min (ref >60)
GFR calc non Af Amer: >60 mL/min (ref >60)
Glucose, Bld: 92 mg/dL (ref 70–99)
Potassium: 4.3 mmol/L (ref 3.5–5.1)
Sodium: 141 mmol/L (ref 135–145)
Total Bilirubin: 0.5 mg/dL (ref 0.3–1.2)
Total Protein: 7.6 g/dL (ref 6.5–8.1)

## 2019-03-27 LAB — CBC WITH DIFFERENTIAL/PLATELET
Abs Immature Granulocytes: 0.01 10*3/uL (ref 0.00–0.07)
Basophils Absolute: 0.1 10*3/uL (ref 0.0–0.1)
Basophils Relative: 2 %
Eosinophils Absolute: 0.2 10*3/uL (ref 0.0–0.5)
Eosinophils Relative: 3 %
HCT: 43.8 % (ref 36.0–46.0)
Hemoglobin: 14.3 g/dL (ref 12.0–15.0)
Immature Granulocytes: 0 %
Lymphocytes Relative: 18 %
Lymphs Abs: 0.9 10*3/uL (ref 0.7–4.0)
MCH: 29.8 pg (ref 26.0–34.0)
MCHC: 32.6 g/dL (ref 30.0–36.0)
MCV: 91.3 fL (ref 80.0–100.0)
Monocytes Absolute: 0.5 10*3/uL (ref 0.1–1.0)
Monocytes Relative: 11 %
Neutro Abs: 3.2 10*3/uL (ref 1.7–7.7)
Neutrophils Relative %: 66 %
Platelets: 302 10*3/uL (ref 150–400)
RBC: 4.8 MIL/uL (ref 3.87–5.11)
RDW: 12.1 % (ref 11.5–15.5)
WBC: 5 10*3/uL (ref 4.0–10.5)
nRBC: 0 % (ref 0.0–0.2)

## 2019-03-27 LAB — CEA (IN HOUSE-CHCC): CEA (CHCC-In House): 2.67 ng/mL (ref 0.00–5.00)

## 2019-03-31 ENCOUNTER — Encounter: Payer: Self-pay | Admitting: Nurse Practitioner

## 2019-03-31 ENCOUNTER — Inpatient Hospital Stay: Payer: Federal, State, Local not specified - PPO | Attending: Hematology | Admitting: Nurse Practitioner

## 2019-03-31 ENCOUNTER — Telehealth: Payer: Self-pay | Admitting: Nurse Practitioner

## 2019-03-31 DIAGNOSIS — Z9221 Personal history of antineoplastic chemotherapy: Secondary | ICD-10-CM

## 2019-03-31 DIAGNOSIS — Z923 Personal history of irradiation: Secondary | ICD-10-CM | POA: Diagnosis not present

## 2019-03-31 DIAGNOSIS — C20 Malignant neoplasm of rectum: Secondary | ICD-10-CM

## 2019-03-31 NOTE — Progress Notes (Addendum)
[place]   [phone] [phone]   Clinic Follow up Note   Patient Care Team: Kathyrn Lass, MD as PCP - General (Family Medicine) [name], MD as Consulting Physician (General Surgery) Kyung Rudd, MD as Consulting Physician (Radiation Oncology) Mauri Pole, MD as Consulting Physician (Gastroenterology) 03/31/2019  I connected with Molly Vang on 03/31/19 at 9:22 AM EDT by telephone visit and verified that I am speaking with the correct person using two identifiers.   I discussed the limitations, risks, security and privacy concerns of performing an evaluation and management service by telemedicine and the availability of in-person appointments. I also discussed with the patient that there may be a patient responsible charge related to this service. The patient expressed understanding and agreed to proceed.   Other persons participating in the visit and their role in the encounter: None  Patients location: home Providers location: home office   CHIEF COMPLAINT: f/u rectal cancer   SUMMARY OF ONCOLOGIC HISTORY: Oncology History Overview Note  Rectal adenocarcinoma ([place])   Staging form: Colon and Rectum, AJCC 7th Edition     Clinical stage from 08/04/2015: Stage IIA (T3, N0, M0) - Signed by Truitt Merle, MD on 08/31/2015     Rectal adenocarcinoma ([place])  08/04/2015 Initial Biopsy   rectal mass adenocarcinoma, descending colon polyp hyperplastic polyp.    08/04/2015 Procedure   5 cm rectal mass starting at 10 cm from anal verge extending to 15 cm, 74m sigmoid polyp was removed    08/06/2015 Imaging   CT chest, abdomen and pelvis w contrast showed a possible area of rectal wall thickening aterior to the distal sacrum, no adenopathy or distant metastasis    08/13/2015 Initial Diagnosis   Rectal adenocarcinoma ([place]   08/19/2015 Procedure   low EUS showed a T3N0 rectal mass    08/31/2015 -  Radiation Therapy   radiation to rectal  adenocarcinoma    08/31/2015 - 10/08/2015 Chemotherapy   Capecitabine '1500mg'$  q12h with concurrent radiation, held after 09/29/2015 due to transaminitis    11/26/2015 Surgery   Rectosigmoid segmental resection, surgical margins were negative.   11/26/2015 Pathology Results   Rectosigmoid segmental resection showed invasive colonic adenocarcinoma, grade 2, T1, margins were negative, 15 lymph nodes were negative. LVI (-), PNI (-)   08/24/2016 Imaging    Colonscopy  Performed by Dr. NSilverio DecampImpression:  - Diverticulosis in the sigmoid colon - one 1 mm polyp in the sigmoid colon, removed with a cold biopsy forceps. Resceted and retrieeved - non-bleeding interanl hemorrhoids - anal papilla(e) were hypertrophied - the examination was otherwise normal     09/11/2016 Imaging   CT CAP w contrast 09/11/2016 IMPRESSION: 1. No evidence of metastatic rectal cancer within the chest, abdomen or pelvis. 2. Interval postsurgical changes in the rectum and anterior abdominal wall without apparent complication. 3. Tiny renal lesions bilaterally, too small to optimally characterize, although likely cysts.   08/24/2017 Procedure   Colonoscopy by Dr. NSilverio Decamp12/28/18  IMPRESSION - Moderate diverticulosis in the sigmoid colon, in the descending colon, in the transverse colon, at the hepatic flexure and in the ascending colon. There was no evidence of diverticular bleeding. - Patent end-to-end colo-rectal anastomosis, characterized by healthy appearing mucosa and an intact staple line. - Non-bleeding internal hemorrhoids. - No specimens collected.   08/2018 Imaging   CT CAP w Contrast  IMPRESSION: No evidence of metastatic disease.     CURRENT THERAPY: Surveillance  INTERVAL HISTORY: Ms. CGaertnerpresented by phone  for today's follow up. She was last seen 1 year ago by Dr. Burr Medico. She denies changes in her health in the interval. She feels very well. She remains active, walks 6 miles per day, and  lives healthy lifestyle. She does not smoke cigarettes. Takes aspirin and OTC vitamins. She continues routine f/u with PCP and GYN. She reports being UTD on age-appropriate cancer screenings. She reports normal appetite and weight. Denies unintentional weight loss or fatigue. Denies change in bowel habits or blood in stool. Eats high fiber diet. Denies difficulty or pain with urination. Denies fever, chills, cough, chest pain, dyspnea, leg edema, or pain.    MEDICAL HISTORY:  Past Medical History:  Diagnosis Date   Allergy    Cancer ([place])    melanoma right bicep removed   Crepitus of cervical spine    face, neck, arms; MD and anesthesia dispute the cause   Rectal cancer ([place]) 2016   SVT (supraventricular tachycardia) ([place]) 06/11/2013   svt on 10/6// ablation on 10/15    SURGICAL HISTORY: Past Surgical History:  Procedure Laterality Date   COLONOSCOPY     EUS N/A 08/19/2015   Procedure: LOWER ENDOSCOPIC ULTRASOUND (EUS);  Surgeon: Milus Banister, MD;  Location: [facility];  Service: Endoscopy;  Laterality: N/A;   NM MYOCAR PERF WALL MOTION  12/14/2010   normal   OVARIAN CYST SURGERY  1993   SUPRAVENTRICULAR TACHYCARDIA ABLATION  06/11/2013   SUPRAVENTRICULAR TACHYCARDIA ABLATION N/A 06/11/2013   Procedure: SUPRAVENTRICULAR TACHYCARDIA ABLATION;  Surgeon: Evans Lance, MD;  Location: [facility] CATH LAB;  Service: Cardiovascular;  Laterality: N/A;   TONSILLECTOMY  1960   TUBAL LIGATION     US ECHOCARDIOGRAPHY  11/14/11   trace TR,MR   WISDOM TOOTH EXTRACTION     XI ROBOTIC ASSISTED LOWER ANTERIOR RESECTION N/A 11/26/2015   Procedure: XI ROBOTIC ASSISTED LOWER ANTERIOR RESECTION WITH RIGID PROCTOSCOPY;  Surgeon: [name], MD;  Location: [facility] OR[facility];  Service: General;  Laterality: N/A;    I have reviewed the social history and family history with the patient and they are unchanged from previous note.  ALLERGIES:  has No Known Allergies.  MEDICATIONS:  Current  Outpatient Medications  Medication Sig Dispense Refill   aspirin EC 81 MG tablet Take 81 mg by mouth daily. Reported on 10/08/2015     BIOTIN 5000 PO Take 1 tablet by mouth daily.     Calcium Carbonate-Vitamin D (CALCIUM 600 + D PO) Take 1 tablet by mouth daily. Vit. D3 is 400iu     chlorhexidine (PERIDEX) 0.12 % solution Use as directed 15 mLs in the mouth or throat daily.     chlorpheniramine (CHLOR-TRIMETON) 4 MG tablet Take 4 mg by mouth 2 (two) times daily. Reported on 09/03/2015     cholecalciferol (VITAMIN D) 1000 units tablet Take 1,000 Units by mouth daily.     fish oil-omega-3 fatty acids 1000 MG capsule Take 1 g by mouth daily. EPA '570mg'$   And DHA '130mg'$      Multiple Vitamins-Minerals (MULTIVITAMIN WOMEN 50+ PO) Take 1 tablet by mouth daily.     Polyethyl Glycol-Propyl Glycol (SYSTANE) 0.4-0.3 % SOLN Apply 1 drop to eye every evening.     zolpidem (AMBIEN) 10 MG tablet Take 5 mg by mouth at bedtime as needed for sleep.   2   No current facility-administered medications for this visit.     PHYSICAL EXAMINATION: ECOG PERFORMANCE STATUS: 0 - Asymptomatic No vitals for this visit.  Patient appears well over  the phone. Mood and affect appear normal. Speech is intact, non-pressured.    LABORATORY DATA:  I have reviewed the data as listed CBC Latest Ref Rng & Units 03/27/2019 09/09/2018 03/28/2018  WBC 4.0 - 10.5 K/uL 5.0 4.8 5.6  Hemoglobin 12.0 - 15.0 g/dL 14.3 13.7 13.0  Hematocrit 36.0 - 46.0 % 43.8 40.9 38.5  Platelets 150 - 400 K/uL 302 280 251     CMP Latest Ref Rng & Units 03/27/2019 09/09/2018 03/28/2018  Glucose 70 - 99 mg/dL 92 89 91  BUN 8 - 23 mg/dL '15 14 14  '$ Creatinine 0.44 - 1.00 mg/dL 0.81 0.78 0.81  Sodium 135 - 145 mmol/L 141 141 141  Potassium 3.5 - 5.1 mmol/L 4.3 4.0 3.8  Chloride 98 - 111 mmol/L 104 107 105  CO2 22 - 32 mmol/L '29 26 29  '$ Calcium 8.9 - 10.3 mg/dL 9.6 9.0 9.3  Total Protein 6.5 - 8.1 g/dL 7.6 7.1 7.0  Total Bilirubin 0.3 - 1.2 mg/dL 0.5  0.6 0.5  Alkaline Phos 38 - 126 U/L 95 89 88  AST 15 - 41 U/L '23 25 24  '$ ALT 0 - 44 U/L '21 26 20     '$ RADIOGRAPHIC STUDIES: I have personally reviewed the radiological images as listed and agreed with the findings in the report. No results found.   ASSESSMENT & PLAN: 63 y.o.  Caucasian female, history of superficial skin melanoma and basal cell carcinoma, otherwise healthy and fit, presented with rectal bleeding.   1. Rectal adenocarcinoma, cT3N0M0, ypT1N0M0, MMR normal -diagnosed 07/2015 s/p neoadjuvant chemoRT with Xeloda and surgical resection.  Dispo: Ms. Strahm appears well clinically. She has no obvious side effects from chemoRT. No physical exam today. Labs reviewed, CBC, CMP, CEA all normal. No clinical concern for recurrence. Last surveillance CT in 08/2018 was negative. She plans to f/u with Dr. Marcello Moores in Spring 2021. Repeat colonoscopy per Dr. Silverio Decamp due in 07/2020. I will CC my note to these providers. I recommend f/u with [place] in 1 year, Patient prefers to f/u with Dr. Burr Medico in 2022, which will be 5 years from her resection. She is almost 4 years from diagnosis, risk of recurrence is decreased.   We reviewed signs of recurrence, she knows to monitor for red flags such as blood in stool, change in bowel habits, unintentional weight loss, or new unexplained fatigue. She continues to eat healthy, remain active, and abstain from smoking. She continues f/u with PCP and GYN routinely, and is UTD on other cancer screenings. She was encouraged to call for in-person evaluation with any new concerns.   All questions were answered. The patient knows to call the clinic with any problems, questions or concerns. No barriers to learning was detected. I spent 12 minutes non-face-to-face time in today's encounter.      Alla Feeling, NP 03/31/19

## 2019-03-31 NOTE — Telephone Encounter (Signed)
Per 8/3 los Lab, f/u YF in 2022 per patient preference   Called patient and spoke with the patient spouse and informed the spouse to let the patient know to give [place] a call when it is time to schedule the appt.  Lab and the MD appt template were not open up that far out.    Patient will call us when it is time to schedule the lab, and MD visit.

## 2019-06-12 DIAGNOSIS — M859 Disorder of bone density and structure, unspecified: Secondary | ICD-10-CM | POA: Diagnosis not present

## 2019-06-12 DIAGNOSIS — Z13220 Encounter for screening for lipoid disorders: Secondary | ICD-10-CM | POA: Diagnosis not present

## 2019-06-12 DIAGNOSIS — Z0000 Encounter for general adult medical examination without abnormal findings: Secondary | ICD-10-CM | POA: Diagnosis not present

## 2019-06-12 DIAGNOSIS — Z8249 Family history of ischemic heart disease and other diseases of the circulatory system: Secondary | ICD-10-CM | POA: Diagnosis not present

## 2019-07-07 DIAGNOSIS — Z13820 Encounter for screening for osteoporosis: Secondary | ICD-10-CM | POA: Diagnosis not present

## 2019-07-15 DIAGNOSIS — D1801 Hemangioma of skin and subcutaneous tissue: Secondary | ICD-10-CM | POA: Diagnosis not present

## 2019-07-15 DIAGNOSIS — L821 Other seborrheic keratosis: Secondary | ICD-10-CM | POA: Diagnosis not present

## 2019-07-15 DIAGNOSIS — Z85828 Personal history of other malignant neoplasm of skin: Secondary | ICD-10-CM | POA: Diagnosis not present

## 2019-07-15 DIAGNOSIS — Z85820 Personal history of malignant melanoma of skin: Secondary | ICD-10-CM | POA: Diagnosis not present

## 2019-08-23 DIAGNOSIS — Z20828 Contact with and (suspected) exposure to other viral communicable diseases: Secondary | ICD-10-CM | POA: Diagnosis not present

## 2019-08-25 DIAGNOSIS — Z85820 Personal history of malignant melanoma of skin: Secondary | ICD-10-CM | POA: Diagnosis not present

## 2019-08-25 DIAGNOSIS — D171 Benign lipomatous neoplasm of skin and subcutaneous tissue of trunk: Secondary | ICD-10-CM | POA: Diagnosis not present

## 2019-08-25 DIAGNOSIS — Z85828 Personal history of other malignant neoplasm of skin: Secondary | ICD-10-CM | POA: Diagnosis not present

## 2019-09-10 DIAGNOSIS — Z1231 Encounter for screening mammogram for malignant neoplasm of breast: Secondary | ICD-10-CM | POA: Diagnosis not present

## 2019-09-20 ENCOUNTER — Ambulatory Visit: Payer: Federal, State, Local not specified - PPO | Attending: Internal Medicine

## 2019-09-20 DIAGNOSIS — Z23 Encounter for immunization: Secondary | ICD-10-CM | POA: Insufficient documentation

## 2019-09-20 NOTE — Progress Notes (Signed)
   Covid-19 Vaccination Clinic  Name:  [name]    MRN: [id number] DOB: 05-13-1956  09/20/2019  [name] was observed post Covid-19 immunization for 15 minutes without incidence. She was provided with Vaccine Information Sheet and instruction to access the V-Safe system.   [name] was instructed to call 911 with any severe reactions post vaccine: Marland Kitchen Difficulty breathing  . Swelling of your face and throat  . A fast heartbeat  . A bad rash all over your body  . Dizziness and weakness    Immunizations Administered    Name Date Dose VIS Date Route   Pfizer COVID-19 Vaccine 09/20/2019 12:24 PM 0.3 mL 08/08/2019 Intramuscular   Manufacturer: [place]   Lot: [id number]   [place]: [id number]

## 2019-10-11 ENCOUNTER — Ambulatory Visit: Payer: PPO | Attending: Internal Medicine

## 2019-10-11 DIAGNOSIS — Z23 Encounter for immunization: Secondary | ICD-10-CM

## 2019-10-11 NOTE — Progress Notes (Signed)
   Covid-19 Vaccination Clinic  Name:  Molly Vang    MRN: [id number] DOB: 09-14-1955  10/11/2019  Molly Vang was observed post Covid-19 immunization for 15 minutes without incidence. She was provided with Vaccine Information Sheet and instruction to access the V-Safe system.   Molly Vang was instructed to call 911 with any severe reactions post vaccine: Marland Kitchen Difficulty breathing  . Swelling of your face and throat  . A fast heartbeat  . A bad rash all over your body  . Dizziness and weakness    Immunizations Administered    Name Date Dose VIS Date Route   Pfizer COVID-19 Vaccine 10/11/2019 11:20 AM 0.3 mL 08/08/2019 Intramuscular   Manufacturer: [place]   Lot: [id number]   [place]: [id number]

## 2019-10-14 DIAGNOSIS — Z01419 Encounter for gynecological examination (general) (routine) without abnormal findings: Secondary | ICD-10-CM | POA: Diagnosis not present

## 2019-10-14 DIAGNOSIS — Z6822 Body mass index (BMI) 22.0-22.9, adult: Secondary | ICD-10-CM | POA: Diagnosis not present

## 2020-01-01 DIAGNOSIS — Z85820 Personal history of malignant melanoma of skin: Secondary | ICD-10-CM | POA: Diagnosis not present

## 2020-01-01 DIAGNOSIS — D1801 Hemangioma of skin and subcutaneous tissue: Secondary | ICD-10-CM | POA: Diagnosis not present

## 2020-01-01 DIAGNOSIS — D2272 Melanocytic nevi of left lower limb, including hip: Secondary | ICD-10-CM | POA: Diagnosis not present

## 2020-01-01 DIAGNOSIS — L821 Other seborrheic keratosis: Secondary | ICD-10-CM | POA: Diagnosis not present

## 2020-01-01 DIAGNOSIS — L820 Inflamed seborrheic keratosis: Secondary | ICD-10-CM | POA: Diagnosis not present

## 2020-01-01 DIAGNOSIS — Z85828 Personal history of other malignant neoplasm of skin: Secondary | ICD-10-CM | POA: Diagnosis not present

## 2020-01-01 DIAGNOSIS — D225 Melanocytic nevi of trunk: Secondary | ICD-10-CM | POA: Diagnosis not present

## 2020-01-01 DIAGNOSIS — L905 Scar conditions and fibrosis of skin: Secondary | ICD-10-CM | POA: Diagnosis not present

## 2020-01-01 DIAGNOSIS — L570 Actinic keratosis: Secondary | ICD-10-CM | POA: Diagnosis not present

## 2020-01-01 DIAGNOSIS — L814 Other melanin hyperpigmentation: Secondary | ICD-10-CM | POA: Diagnosis not present

## 2020-01-01 DIAGNOSIS — L720 Epidermal cyst: Secondary | ICD-10-CM | POA: Diagnosis not present

## 2020-01-01 DIAGNOSIS — D692 Other nonthrombocytopenic purpura: Secondary | ICD-10-CM | POA: Diagnosis not present

## 2020-03-15 DIAGNOSIS — Z85048 Personal history of other malignant neoplasm of rectum, rectosigmoid junction, and anus: Secondary | ICD-10-CM | POA: Diagnosis not present

## 2020-04-09 DIAGNOSIS — L430 Hypertrophic lichen planus: Secondary | ICD-10-CM | POA: Diagnosis not present

## 2020-04-09 DIAGNOSIS — L821 Other seborrheic keratosis: Secondary | ICD-10-CM | POA: Diagnosis not present

## 2020-04-09 DIAGNOSIS — Z85828 Personal history of other malignant neoplasm of skin: Secondary | ICD-10-CM | POA: Diagnosis not present

## 2020-04-09 DIAGNOSIS — D485 Neoplasm of uncertain behavior of skin: Secondary | ICD-10-CM | POA: Diagnosis not present

## 2020-04-09 DIAGNOSIS — Z85820 Personal history of malignant melanoma of skin: Secondary | ICD-10-CM | POA: Diagnosis not present

## 2020-05-31 DIAGNOSIS — Z20828 Contact with and (suspected) exposure to other viral communicable diseases: Secondary | ICD-10-CM | POA: Diagnosis not present

## 2020-06-12 ENCOUNTER — Ambulatory Visit: Payer: PPO | Attending: Internal Medicine

## 2020-06-12 DIAGNOSIS — Z23 Encounter for immunization: Secondary | ICD-10-CM

## 2020-06-12 NOTE — Progress Notes (Signed)
   Covid-19 Vaccination Clinic  Name:  Molly Vang    MRN: [id number] DOB: May 04, 1956  06/12/2020  Molly Vang was observed post Covid-19 immunization for 15 minutes without incident. She was provided with Vaccine Information Sheet and instruction to access the V-Safe system.   Molly Vang was instructed to call 911 with any severe reactions post vaccine: Marland Kitchen Difficulty breathing  . Swelling of face and throat  . A fast heartbeat  . A bad rash all over body  . Dizziness and weakness

## 2020-06-29 DIAGNOSIS — D3131 Benign neoplasm of right choroid: Secondary | ICD-10-CM | POA: Diagnosis not present

## 2020-06-30 DIAGNOSIS — Z23 Encounter for immunization: Secondary | ICD-10-CM | POA: Diagnosis not present

## 2020-06-30 DIAGNOSIS — N952 Postmenopausal atrophic vaginitis: Secondary | ICD-10-CM | POA: Diagnosis not present

## 2020-06-30 DIAGNOSIS — Z8679 Personal history of other diseases of the circulatory system: Secondary | ICD-10-CM | POA: Diagnosis not present

## 2020-06-30 DIAGNOSIS — M859 Disorder of bone density and structure, unspecified: Secondary | ICD-10-CM | POA: Diagnosis not present

## 2020-06-30 DIAGNOSIS — Z6822 Body mass index (BMI) 22.0-22.9, adult: Secondary | ICD-10-CM | POA: Diagnosis not present

## 2020-06-30 DIAGNOSIS — Z85820 Personal history of malignant melanoma of skin: Secondary | ICD-10-CM | POA: Diagnosis not present

## 2020-06-30 DIAGNOSIS — Z85038 Personal history of other malignant neoplasm of large intestine: Secondary | ICD-10-CM | POA: Diagnosis not present

## 2020-06-30 DIAGNOSIS — Z136 Encounter for screening for cardiovascular disorders: Secondary | ICD-10-CM | POA: Diagnosis not present

## 2020-06-30 DIAGNOSIS — F5101 Primary insomnia: Secondary | ICD-10-CM | POA: Diagnosis not present

## 2020-06-30 DIAGNOSIS — Z0000 Encounter for general adult medical examination without abnormal findings: Secondary | ICD-10-CM | POA: Diagnosis not present

## 2020-07-08 ENCOUNTER — Encounter: Payer: Self-pay | Admitting: Gastroenterology

## 2020-07-09 DIAGNOSIS — L570 Actinic keratosis: Secondary | ICD-10-CM | POA: Diagnosis not present

## 2020-07-09 DIAGNOSIS — D1801 Hemangioma of skin and subcutaneous tissue: Secondary | ICD-10-CM | POA: Diagnosis not present

## 2020-07-09 DIAGNOSIS — D2262 Melanocytic nevi of left upper limb, including shoulder: Secondary | ICD-10-CM | POA: Diagnosis not present

## 2020-07-09 DIAGNOSIS — L821 Other seborrheic keratosis: Secondary | ICD-10-CM | POA: Diagnosis not present

## 2020-07-09 DIAGNOSIS — Z85820 Personal history of malignant melanoma of skin: Secondary | ICD-10-CM | POA: Diagnosis not present

## 2020-07-09 DIAGNOSIS — Z85828 Personal history of other malignant neoplasm of skin: Secondary | ICD-10-CM | POA: Diagnosis not present

## 2020-07-09 DIAGNOSIS — L720 Epidermal cyst: Secondary | ICD-10-CM | POA: Diagnosis not present

## 2020-07-09 DIAGNOSIS — D2272 Melanocytic nevi of left lower limb, including hip: Secondary | ICD-10-CM | POA: Diagnosis not present

## 2020-07-09 DIAGNOSIS — D2261 Melanocytic nevi of right upper limb, including shoulder: Secondary | ICD-10-CM | POA: Diagnosis not present

## 2020-07-09 DIAGNOSIS — D225 Melanocytic nevi of trunk: Secondary | ICD-10-CM | POA: Diagnosis not present

## 2020-07-09 DIAGNOSIS — L814 Other melanin hyperpigmentation: Secondary | ICD-10-CM | POA: Diagnosis not present

## 2020-07-10 DIAGNOSIS — Z20822 Contact with and (suspected) exposure to covid-19: Secondary | ICD-10-CM | POA: Diagnosis not present

## 2020-07-27 DIAGNOSIS — H16041 Marginal corneal ulcer, right eye: Secondary | ICD-10-CM | POA: Diagnosis not present

## 2020-08-11 ENCOUNTER — Ambulatory Visit (AMBULATORY_SURGERY_CENTER): Payer: Self-pay

## 2020-08-11 ENCOUNTER — Other Ambulatory Visit: Payer: Self-pay

## 2020-08-11 VITALS — Ht 67.0 in | Wt 142.0 lb

## 2020-08-11 DIAGNOSIS — Z86010 Personal history of colonic polyps: Secondary | ICD-10-CM

## 2020-08-11 MED ORDER — SUTAB 1479-225-188 MG PO TABS: 1.0000 | ORAL_TABLET | ORAL | 0 refills | Status: DC

## 2020-08-11 NOTE — Progress Notes (Signed)

## 2020-08-16 ENCOUNTER — Encounter: Payer: Self-pay | Admitting: Gastroenterology

## 2020-09-02 ENCOUNTER — Encounter: Payer: Self-pay | Admitting: Gastroenterology

## 2020-09-02 ENCOUNTER — Ambulatory Visit (AMBULATORY_SURGERY_CENTER): Payer: PPO | Admitting: Gastroenterology

## 2020-09-02 ENCOUNTER — Other Ambulatory Visit: Payer: Self-pay

## 2020-09-02 VITALS — BP 116/74 | HR 69 | Temp 97.1°F | Resp 14 | Ht 67.0 in | Wt 142.0 lb

## 2020-09-02 DIAGNOSIS — Z1211 Encounter for screening for malignant neoplasm of colon: Secondary | ICD-10-CM | POA: Diagnosis not present

## 2020-09-02 DIAGNOSIS — Z9189 Other specified personal risk factors, not elsewhere classified: Secondary | ICD-10-CM

## 2020-09-02 DIAGNOSIS — Z85048 Personal history of other malignant neoplasm of rectum, rectosigmoid junction, and anus: Secondary | ICD-10-CM | POA: Diagnosis not present

## 2020-09-02 HISTORY — PX: COLONOSCOPY: SHX174

## 2020-09-02 MED ORDER — SODIUM CHLORIDE 0.9 % IV SOLN: 500.0000 mL | Freq: Once | INTRAVENOUS | Status: DC

## 2020-09-02 NOTE — Progress Notes (Signed)
Pt's states no medical or surgical changes since previsit or office visit. ° ° °Vitals BC °

## 2020-09-02 NOTE — Progress Notes (Signed)
A/ox3, pleased with MAC, report to RN 

## 2020-09-02 NOTE — Patient Instructions (Signed)
HANDOUTS PROVIDED ON:  DIVERTICULOSIS & HEMORRHOIDS  Your next colonoscopy should occur in 5 years for surveillance.    You may resume your previous diet and medication schedule.  Thank you for allowing [place] to care for you today!!!   YOU HAD AN ENDOSCOPIC PROCEDURE TODAY AT THE [facility]:   Refer to the procedure report that was given to you for any specific questions about what was found during the examination.  If the procedure report does not answer your questions, please call your gastroenterologist to clarify.  If you requested that your care partner not be given the details of your procedure findings, then the procedure report has been included in a sealed envelope for you to review at your convenience later.  YOU SHOULD EXPECT: Some feelings of bloating in the abdomen. Passage of more gas than usual.  Walking can help get rid of the air that was put into your GI tract during the procedure and reduce the bloating. If you had a lower endoscopy (such as a colonoscopy or flexible sigmoidoscopy) you may notice spotting of blood in your stool or on the toilet paper. If you underwent a bowel prep for your procedure, you may not have a normal bowel movement for a few days.  Please Note:  You might notice some irritation and congestion in your nose or some drainage.  This is from the oxygen used during your procedure.  There is no need for concern and it should clear up in a day or so.  SYMPTOMS TO REPORT IMMEDIATELY:   Following lower endoscopy (colonoscopy or flexible sigmoidoscopy):  Excessive amounts of blood in the stool  Significant tenderness or worsening of abdominal pains  Swelling of the abdomen that is new, acute  Fever of 100F or higher  For urgent or emergent issues, a gastroenterologist can be reached at any hour by calling [phone]. Do not use MyChart messaging for urgent concerns.    DIET:  We do recommend a small meal at first, but then you may proceed  to your regular diet.  Drink plenty of fluids but you should avoid alcoholic beverages for 24 hours.  ACTIVITY:  You should plan to take it easy for the rest of today and you should NOT DRIVE or use heavy machinery until tomorrow (because of the sedation medicines used during the test).    FOLLOW UP: Our staff will call the number listed on your records Monday morning between 7:15am and 8:15 am to check on you and address any questions or concerns that you may have regarding the information given to you following your procedure. If we do not reach you, we will leave a message.  We will attempt to reach you two times.  During this call, we will ask if you have developed any symptoms of COVID 19. If you develop any symptoms (ie: fever, flu-like symptoms, shortness of breath, cough etc.) before then, please call [phone].  If you test positive for Covid 19 in the 2 weeks post procedure, please call and report this information to [place].    If any biopsies were taken you will be contacted by phone or by letter within the next 1-3 weeks.  Please call us at [phone] if you have not heard about the biopsies in 3 weeks.    SIGNATURES/CONFIDENTIALITY: You and/or your care partner have signed paperwork which will be entered into your electronic medical record.  These signatures attest to the fact that that the information above on  your After Visit Summary has been reviewed and is understood.  Full responsibility of the confidentiality of this discharge information lies with you and/or your care-partner. 

## 2020-09-02 NOTE — Op Note (Signed)
[facility] Patient Name: Molly Vang Procedure Date: 09/02/2020 8:02 AM MRN: [id number] Endoscopist: Mauri Pole , MD Age: 65 Referring MD:  Date of Birth: [birth year] Gender: Female Account #: [id number] Procedure:                Colonoscopy Indications:              High risk colon cancer surveillance: Personal                            history of colon cancer (Rectal cancer 2016) Medicines:                Monitored Anesthesia Care Procedure:                Pre-Anesthesia Assessment:                           - Prior to the procedure, a History and Physical                            was performed, and patient medications and                            allergies were reviewed. The patient's tolerance of                            previous anesthesia was also reviewed. The risks                            and benefits of the procedure and the sedation                            options and risks were discussed with the patient.                            All questions were answered, and informed consent                            was obtained. Prior Anticoagulants: The patient has                            taken no previous anticoagulant or antiplatelet                            agents. ASA Grade Assessment: II - A patient with                            mild systemic disease. After reviewing the risks                            and benefits, the patient was deemed in                            satisfactory condition to undergo the procedure.  After obtaining informed consent, the colonoscope                            was passed under direct vision. Throughout the                            procedure, the patient's blood pressure, pulse, and                            oxygen saturations were monitored continuously. The                            Olympus [id number]) Colonoscope was                            introduced through  the anus and advanced to the the                            cecum, identified by appendiceal orifice and                            ileocecal valve. The colonoscopy was performed                            without difficulty. The patient tolerated the                            procedure well. The quality of the bowel                            preparation was good. The ileocecal valve,                            appendiceal orifice, and rectum were photographed. Scope In: 8:12:11 AM Scope Out: 8:32:36 AM Scope Withdrawal Time: 0 hours 15 minutes 30 seconds  Total Procedure Duration: 0 hours 20 minutes 25 seconds  Findings:                 The perianal and digital rectal examinations were                            normal.                           There was evidence of a prior functional end-to-end                            colo-colonic anastomosis in the recto-sigmoid                            colon. This was patent and was characterized by an                            intact staple line. The anastomosis was traversed.  Scattered small-mouthed diverticula were found in                            the descending colon, transverse colon and                            ascending colon.                           Non-bleeding external and internal hemorrhoids were                            found during retroflexion. The hemorrhoids were                            small.                           The exam was otherwise without abnormality. Complications:            No immediate complications. Estimated Blood Loss:     Estimated blood loss was minimal. Impression:               - Patent functional end-to-end colo-colonic                            anastomosis, characterized by an intact staple line.                           - Mild diverticulosis in the descending colon, in                            the transverse colon and in the ascending colon.                            - Non-bleeding external and internal hemorrhoids.                           - The examination was otherwise normal.                           - No specimens collected. Recommendation:           - Patient has a contact number available for                            emergencies. The signs and symptoms of potential                            delayed complications were discussed with the                            patient. Return to normal activities tomorrow.                            Written discharge instructions were provided to the  patient.                           - Resume previous diet.                           - Continue present medications.                           - Repeat colonoscopy in 5 years for surveillance. Napoleon Form, MD 09/02/2020 8:43:48 AM This report has been signed electronically.

## 2020-09-06 ENCOUNTER — Telehealth: Payer: Self-pay | Admitting: *Deleted

## 2020-09-06 NOTE — Telephone Encounter (Signed)
  Follow up Call-  Call back number 09/02/2020  Post procedure Call Back phone  # [phone]  Permission to leave phone message Yes  Some recent data might be hidden     Patient questions:  Do you have a fever, pain , or abdominal swelling? No. Pain Score  0 *  Have you tolerated food without any problems? Yes.    Have you been able to return to your normal activities? Yes.    Do you have any questions about your discharge instructions: Diet   No. Medications  No. Follow up visit  No.  Do you have questions or concerns about your Care? No.  Actions: * If pain score is 4 or above: No action needed, pain <4.  1. Have you developed a fever since your procedure? no  2.   Have you had an respiratory symptoms (SOB or cough) since your procedure? no  3.   Have you tested positive for COVID 19 since your procedure no  4.   Have you had any family members/close contacts diagnosed with the COVID 19 since your procedure?  no   If yes to any of these questions please route to Joylene John, RN and Joella Prince, RN

## 2020-09-23 DIAGNOSIS — Z20822 Contact with and (suspected) exposure to covid-19: Secondary | ICD-10-CM | POA: Diagnosis not present

## 2020-10-18 DIAGNOSIS — Z01419 Encounter for gynecological examination (general) (routine) without abnormal findings: Secondary | ICD-10-CM | POA: Diagnosis not present

## 2020-10-18 DIAGNOSIS — Z6822 Body mass index (BMI) 22.0-22.9, adult: Secondary | ICD-10-CM | POA: Diagnosis not present

## 2020-11-02 ENCOUNTER — Other Ambulatory Visit: Payer: Self-pay | Admitting: Podiatry

## 2020-11-02 ENCOUNTER — Ambulatory Visit (INDEPENDENT_AMBULATORY_CARE_PROVIDER_SITE_OTHER): Payer: PPO

## 2020-11-02 ENCOUNTER — Encounter: Payer: Self-pay | Admitting: Podiatry

## 2020-11-02 ENCOUNTER — Ambulatory Visit: Payer: PPO | Admitting: Podiatry

## 2020-11-02 ENCOUNTER — Other Ambulatory Visit: Payer: Self-pay

## 2020-11-02 DIAGNOSIS — S99921A Unspecified injury of right foot, initial encounter: Secondary | ICD-10-CM

## 2020-11-02 DIAGNOSIS — M21271 Flexion deformity, right ankle and toes: Secondary | ICD-10-CM

## 2020-11-02 DIAGNOSIS — M2041 Other hammer toe(s) (acquired), right foot: Secondary | ICD-10-CM | POA: Diagnosis not present

## 2020-11-02 DIAGNOSIS — M79671 Pain in right foot: Secondary | ICD-10-CM | POA: Diagnosis not present

## 2020-11-02 NOTE — Patient Instructions (Signed)
Metatarsal pads  [place] shoes  Hari Mari sandals

## 2020-11-02 NOTE — Progress Notes (Signed)
°  Subjective:  Patient ID: Molly Vang, female    DOB: 1956/03/07,  MRN: [id number]  Chief Complaint  Patient presents with   Toe Pain    Left foot 2nd toe is rubbing the big toe     65 y.o. female presents with the above complaint. History confirmed with patient.  She does quite a bit of walking and has been getting worse over the last few months.  Starting to form a corn on the inside of the toe.  No acute traumatic event that causes that she knows of Objective:  Physical Exam: warm, good capillary refill, no trophic changes or ulcerative lesions, normal DP and PT pulses and normal sensory exam. Left Foot: Hallux valgus and tailor's bunion with splayfoot, mild hammertoe of second toe that is fully reducible Right Foot: She is hypermobile first ray with no hallux valgus, metatarsus primus elevatus, deviated medially second toe with pain on palpation to the plantar MTPJ and especially with dorsal drawer test, no evidence of neuroma.  Digit is not currently contracted, mild heloma molle on the DIPJ   Radiographs: X-ray of the right foot: Metatarsus primus elevatus is noted with elongated second ray and medial deviation of the second MTPJ Assessment:   1. Right foot pain   2. Hammertoe of right foot   3. Metatarsus primus elevatus, right   4. Injury of plantar plate of right foot, initial encounter      Plan:  Patient was evaluated and treated and all questions answered.  Reviewed the clinical examination and radiographic findings with the patient in detail.  We discussed the etiology and treatment options for plantar plate injuries and likely that she has a collateral ligament injury of the second toe as well.  Recommended nonoperative treatment at this point with a toe regulator splint as well as metatarsal pads.  We also discussed the same issue on her left foot but this is not nearly as severe and contributing factors include her bunion.  She does not have this on the right side  but she does have a hypermobile first ray with tarsus primus elevatus.  If the metatarsal pads and splinting is successful for her we could consider an orthotic with forefoot posting and metatarsal pad to alleviate pressure on the area.  We also discussed surgical intervention for this if this should fail including Weil osteotomy and plantar plate repair.  We discussed the postoperative course for this and what this would all entail.  Would like to order an MRI prior to pursuing any surgical intervention but hopefully we can avoid this.  Return in about 2 months (around 01/02/2021).

## 2020-12-01 DIAGNOSIS — Z1231 Encounter for screening mammogram for malignant neoplasm of breast: Secondary | ICD-10-CM | POA: Diagnosis not present

## 2021-01-03 DIAGNOSIS — Z20822 Contact with and (suspected) exposure to covid-19: Secondary | ICD-10-CM | POA: Diagnosis not present

## 2021-01-04 ENCOUNTER — Ambulatory Visit: Payer: PPO | Admitting: Podiatry

## 2021-01-05 DIAGNOSIS — D1801 Hemangioma of skin and subcutaneous tissue: Secondary | ICD-10-CM | POA: Diagnosis not present

## 2021-01-05 DIAGNOSIS — L570 Actinic keratosis: Secondary | ICD-10-CM | POA: Diagnosis not present

## 2021-01-05 DIAGNOSIS — D2271 Melanocytic nevi of right lower limb, including hip: Secondary | ICD-10-CM | POA: Diagnosis not present

## 2021-01-05 DIAGNOSIS — D225 Melanocytic nevi of trunk: Secondary | ICD-10-CM | POA: Diagnosis not present

## 2021-01-05 DIAGNOSIS — Z85828 Personal history of other malignant neoplasm of skin: Secondary | ICD-10-CM | POA: Diagnosis not present

## 2021-01-05 DIAGNOSIS — D2272 Melanocytic nevi of left lower limb, including hip: Secondary | ICD-10-CM | POA: Diagnosis not present

## 2021-01-05 DIAGNOSIS — Z85820 Personal history of malignant melanoma of skin: Secondary | ICD-10-CM | POA: Diagnosis not present

## 2021-01-05 DIAGNOSIS — L821 Other seborrheic keratosis: Secondary | ICD-10-CM | POA: Diagnosis not present

## 2021-03-30 ENCOUNTER — Inpatient Hospital Stay: Payer: PPO | Attending: Hematology

## 2021-03-30 ENCOUNTER — Other Ambulatory Visit: Payer: Self-pay

## 2021-03-30 DIAGNOSIS — Z85820 Personal history of malignant melanoma of skin: Secondary | ICD-10-CM | POA: Insufficient documentation

## 2021-03-30 DIAGNOSIS — C20 Malignant neoplasm of rectum: Secondary | ICD-10-CM

## 2021-03-30 DIAGNOSIS — Z7982 Long term (current) use of aspirin: Secondary | ICD-10-CM | POA: Insufficient documentation

## 2021-03-30 DIAGNOSIS — Z923 Personal history of irradiation: Secondary | ICD-10-CM | POA: Insufficient documentation

## 2021-03-30 DIAGNOSIS — I471 Supraventricular tachycardia: Secondary | ICD-10-CM | POA: Insufficient documentation

## 2021-03-30 LAB — CBC WITH DIFFERENTIAL/PLATELET
Abs Immature Granulocytes: 0.01 10*3/uL (ref 0.00–0.07)
Basophils Absolute: 0.1 10*3/uL (ref 0.0–0.1)
Basophils Relative: 2 %
Eosinophils Absolute: 0.1 10*3/uL (ref 0.0–0.5)
Eosinophils Relative: 2 %
HCT: 41.3 % (ref 36.0–46.0)
Hemoglobin: 13.9 g/dL (ref 12.0–15.0)
Immature Granulocytes: 0 %
Lymphocytes Relative: 19 %
Lymphs Abs: 1.2 10*3/uL (ref 0.7–4.0)
MCH: 30.5 pg (ref 26.0–34.0)
MCHC: 33.7 g/dL (ref 30.0–36.0)
MCV: 90.6 fL (ref 80.0–100.0)
Monocytes Absolute: 0.6 10*3/uL (ref 0.1–1.0)
Monocytes Relative: 10 %
Neutro Abs: 4.2 10*3/uL (ref 1.7–7.7)
Neutrophils Relative %: 67 %
Platelets: 321 10*3/uL (ref 150–400)
RBC: 4.56 MIL/uL (ref 3.87–5.11)
RDW: 12.3 % (ref 11.5–15.5)
WBC: 6.2 10*3/uL (ref 4.0–10.5)
nRBC: 0 % (ref 0.0–0.2)

## 2021-03-30 LAB — COMPREHENSIVE METABOLIC PANEL
ALT: 23 U/L (ref 0–44)
AST: 24 U/L (ref 15–41)
Albumin: 4.3 g/dL (ref 3.5–5.0)
Alkaline Phosphatase: 93 U/L (ref 38–126)
Anion gap: 9 (ref 5–15)
BUN: 19 mg/dL (ref 8–23)
CO2: 27 mmol/L (ref 22–32)
Calcium: 9.4 mg/dL (ref 8.9–10.3)
Chloride: 105 mmol/L (ref 98–111)
Creatinine, Ser: 0.85 mg/dL (ref 0.44–1.00)
GFR, Estimated: >60 mL/min (ref >60)
Glucose, Bld: 82 mg/dL (ref 70–99)
Potassium: 4.3 mmol/L (ref 3.5–5.1)
Sodium: 141 mmol/L (ref 135–145)
Total Bilirubin: 0.5 mg/dL (ref 0.3–1.2)
Total Protein: 7.2 g/dL (ref 6.5–8.1)

## 2021-03-31 LAB — CEA (IN HOUSE-CHCC): CEA (CHCC-In House): 2.38 ng/mL (ref 0.00–5.00)

## 2021-04-04 ENCOUNTER — Encounter: Payer: Self-pay | Admitting: Hematology

## 2021-04-06 ENCOUNTER — Inpatient Hospital Stay: Payer: PPO | Admitting: Hematology

## 2021-04-06 ENCOUNTER — Other Ambulatory Visit: Payer: Self-pay

## 2021-04-06 ENCOUNTER — Encounter: Payer: Self-pay | Admitting: Hematology

## 2021-04-06 VITALS — BP 149/89 | HR 72 | Temp 98.1°F | Resp 18 | Ht 67.0 in | Wt 142.4 lb

## 2021-04-06 DIAGNOSIS — C20 Malignant neoplasm of rectum: Secondary | ICD-10-CM | POA: Diagnosis not present

## 2021-04-06 NOTE — Progress Notes (Signed)
[facility]   Telephone:(336) [phone] Fax:(336) [phone]   Clinic Follow up Note   Patient Care Team: Sigmund Hazel, MD as PCP - General (Family Medicine) Romie Levee, MD as Consulting Physician (General Surgery) Dorothy Puffer, MD as Consulting Physician (Radiation Oncology) Napoleon Form, MD as Consulting Physician (Gastroenterology) 04/06/2021  CHIEF COMPLAINT: f/u rectal cancer   SUMMARY OF ONCOLOGIC HISTORY: Oncology History Overview Note  Rectal adenocarcinoma [facility])   Staging form: Colon and Rectum, AJCC 7th Edition     Clinical stage from 08/04/2015: Stage IIA (T3, N0, M0) - Signed by Malachy Mood, MD on 08/31/2015      Rectal adenocarcinoma (HCC)  08/04/2015 Initial Biopsy   rectal mass adenocarcinoma, descending colon polyp hyperplastic polyp.     08/04/2015 Procedure   5 cm rectal mass starting at 10 cm from anal verge extending to 15 cm, 54mm sigmoid polyp was removed     08/06/2015 Imaging   CT chest, abdomen and pelvis w contrast showed a possible area of rectal wall thickening aterior to the distal sacrum, no adenopathy or distant metastasis     08/13/2015 Initial Diagnosis   Rectal adenocarcinoma (HCC)    08/19/2015 Procedure   low EUS showed a T3N0 rectal mass     08/31/2015 -  Radiation Therapy   radiation to rectal adenocarcinoma     08/31/2015 - 10/08/2015 Chemotherapy   Capecitabine 1500mg  q12h with concurrent radiation, held after 09/29/2015 due to transaminitis     11/26/2015 Surgery   Rectosigmoid segmental resection, surgical margins were negative.    11/26/2015 Pathology Results   Rectosigmoid segmental resection showed invasive colonic adenocarcinoma, grade 2, T1, margins were negative, 15 lymph nodes were negative. LVI (-), PNI (-)    08/24/2016 Imaging    Colonscopy  Performed by Dr. 08/26/2016 Impression:  - Diverticulosis in the sigmoid colon - one 1 mm polyp in the sigmoid colon, removed with a cold biopsy forceps. Resceted  and retrieeved - non-bleeding interanl hemorrhoids - anal papilla(e) were hypertrophied - the examination was otherwise normal      09/11/2016 Imaging   CT CAP w contrast 09/11/2016 IMPRESSION: 1. No evidence of metastatic rectal cancer within the chest, abdomen or pelvis. 2. Interval postsurgical changes in the rectum and anterior abdominal wall without apparent complication. 3. Tiny renal lesions bilaterally, too small to optimally characterize, although likely cysts.    08/24/2017 Procedure   Colonoscopy by Dr. 08/26/2017 08/24/17  IMPRESSION - Moderate diverticulosis in the sigmoid colon, in the descending colon, in the transverse colon, at the hepatic flexure and in the ascending colon. There was no evidence of diverticular bleeding. - Patent end-to-end colo-rectal anastomosis, characterized by healthy appearing mucosa and an intact staple line. - Non-bleeding internal hemorrhoids. - No specimens collected.    08/2018 Imaging   CT CAP w Contrast  IMPRESSION: No evidence of metastatic disease.     CURRENT THERAPY: Cancer surveillance  INTERVAL HISTORY: Paysen returns for follow-up.  She was last seen by nurse petitioner Marylu Lund two years ago.  She is doing well overall, no pain or other new complains.  She has BM multiple times a day, no diarrhea  No bloating,or abdominal discomfort  Appetite and energy are normal. Weight is stable   All other systems were reviewed with the patient and are negative.  MEDICAL HISTORY:  Past Medical History:  Diagnosis Date   Cancer (HCC)    melanoma right bicep removed   Crepitus of cervical spine  face, neck, arms; MD and anesthesia dispute the cause   Rectal cancer ([place]) 2016   SVT (supraventricular tachycardia) ([place]) 06/11/2013   svt on 10/6// ablation on 10/15    SURGICAL HISTORY: Past Surgical History:  Procedure Laterality Date   CARDIAC ELECTROPHYSIOLOGY STUDY AND ABLATION  2014   COLONOSCOPY  2018   tics/internal  hems/hx of colon polyps/rectal CA hx   COLONOSCOPY  09/02/2020   Nandigam   EUS N/A 08/19/2015   Procedure: LOWER ENDOSCOPIC ULTRASOUND (EUS);  Surgeon: Milus Banister, MD;  Location: [facility];  Service: Endoscopy;  Laterality: N/A;   NM MYOCAR PERF WALL MOTION  12/14/2010   normal   OVARIAN CYST SURGERY  1993   SUPRAVENTRICULAR TACHYCARDIA ABLATION  06/11/2013   SUPRAVENTRICULAR TACHYCARDIA ABLATION N/A 06/11/2013   Procedure: SUPRAVENTRICULAR TACHYCARDIA ABLATION;  Surgeon: Evans Lance, MD;  Location: [facility] CATH [facility];  Service: Cardiovascular;  Laterality: N/A;   TONSILLECTOMY  1960   TUBAL LIGATION     US ECHOCARDIOGRAPHY  11/14/11   trace TR,MR   WISDOM TOOTH EXTRACTION     XI ROBOTIC ASSISTED LOWER ANTERIOR RESECTION N/A 11/26/2015   Procedure: XI ROBOTIC ASSISTED LOWER ANTERIOR RESECTION WITH RIGID PROCTOSCOPY;  Surgeon: [name], MD;  Location: [facility];  Service: General;  Laterality: N/A;    I have reviewed the social history and family history with the patient and they are unchanged from previous note.  ALLERGIES:  has No Known Allergies.  MEDICATIONS:  Current Outpatient Medications  Medication Sig Dispense Refill   aspirin EC 81 MG tablet Take 81 mg by mouth daily. Reported on 10/08/2015     BIOTIN 5000 PO Take 1 tablet by mouth daily.     Calcium Carbonate-Vitamin D (CALCIUM 600 + D PO) Take 1 tablet by mouth daily. Vit. D3 is 400iu     chlorhexidine (PERIDEX) 0.12 % solution Use as directed 15 mLs in the mouth or throat daily.     chlorpheniramine (CHLOR-TRIMETON) 4 MG tablet Take 4 mg by mouth daily. Reported on 09/03/2015     COLLAGEN PO Take 1 tablet by mouth daily.     Cyanocobalamin (VITAMIN B-12 PO) Take 100 mcg by mouth daily.     fish oil-omega-3 fatty acids 1000 MG capsule Take 1 g by mouth daily. EPA $RemoveBe'570mg'wVwbEErrx$   And DHA $Remov'130mg'QfMCkL$      hydrocortisone (ANUSOL-HC) 2.5 % rectal cream Place 1 application rectally as needed for hemorrhoids or anal itching.     Misc  Natural Products (FOCUSED MIND PO) Take 1 tablet by mouth daily. FOCUS FACTOR     Multiple Minerals-Vitamins (CALCIUM-MAGNESIUM-ZINC-D3 PO) Take 1 tablet by mouth daily.     Multiple Vitamins-Minerals (MULTIVITAMIN WOMEN 50+ PO) Take 1 tablet by mouth daily.     nystatin-triamcinolone ointment (MYCOLOG) nystatin-triamcinolone 100,000 unit/gram-0.1 % topical ointment     Polyethyl Glycol-Propyl Glycol 0.4-0.3 % SOLN Apply 1 drop to eye every evening.     zolpidem (AMBIEN) 10 MG tablet Take 5 mg by mouth at bedtime as needed for sleep.   2   No current facility-administered medications for this visit.    PHYSICAL EXAMINATION: ECOG PERFORMANCE STATUS: 0 - Asymptomatic  Vitals:   04/06/21 1109  BP: (!) 149/89  Pulse: 72  Resp: 18  Temp: 98.1 F (36.7 C)  SpO2: 100%   Filed Weights   04/06/21 1109  Weight: 142 lb 6.4 oz (64.6 kg)    GENERAL:alert, no distress and comfortable SKIN: skin color, texture, turgor are normal,  no rashes or significant lesions EYES: normal, Conjunctiva are pink and non-injected, sclera clear LYMPH:  no palpable lymphadenopathy in the cervical, axillary or inguinal LUNGS: clear to auscultation and percussion with normal breathing effort HEART: regular rate & rhythm and no murmurs and no lower extremity edema ABDOMEN:abdomen soft, non-tender and normal bowel sounds Musculoskeletal:no cyanosis of digits and no clubbing  NEURO: alert & oriented x 3 with fluent speech, no focal motor/sensory deficits  LABORATORY DATA:  I have reviewed the data as listed CBC Latest Ref Rng & Units 03/30/2021 03/27/2019 09/09/2018  WBC 4.0 - 10.5 K/uL 6.2 5.0 4.8  Hemoglobin 12.0 - 15.0 g/dL 13.9 14.3 13.7  Hematocrit 36.0 - 46.0 % 41.3 43.8 40.9  Platelets 150 - 400 K/uL 321 302 280     CMP Latest Ref Rng & Units 03/30/2021 03/27/2019 09/09/2018  Glucose 70 - 99 mg/dL 82 92 89  BUN 8 - 23 mg/dL $Remove'19 15 14  'IwZtBkI$ Creatinine 0.44 - 1.00 mg/dL 0.85 0.81 0.78  Sodium 135 - 145 mmol/L 141  141 141  Potassium 3.5 - 5.1 mmol/L 4.3 4.3 4.0  Chloride 98 - 111 mmol/L 105 104 107  CO2 22 - 32 mmol/L $RemoveB'27 29 26  'SuOsZkea$ Calcium 8.9 - 10.3 mg/dL 9.4 9.6 9.0  Total Protein 6.5 - 8.1 g/dL 7.2 7.6 7.1  Total Bilirubin 0.3 - 1.2 mg/dL 0.5 0.5 0.6  Alkaline Phos 38 - 126 U/L 93 95 89  AST 15 - 41 U/L $Remo'24 23 25  'aOyws$ ALT 0 - 44 U/L $Remo'23 21 26      'kZuKi$ RADIOGRAPHIC STUDIES: I have personally reviewed the radiological images as listed and agreed with the findings in the report. No results found.   ASSESSMENT & PLAN:  65 yo female    1. Rectal adenocarcinoma, cT3N0M0, ypT1N0M0, MMR normal -diagnosed 07/2015 s/p neoadjuvant chemoRT with Xeloda and surgical resection. She declined adjuvant chemo Xeloda  -She has been doing well on surveillance, asymptomatic, lab and exam are unremarkable today. -Last CT scan in January 2020 showed no evidence of recurrence -She has completed 5 years of cancer surveillance, risk of recurrence is minimal now. -She had multiple questions about her cancer surveillance, I answered all to her satisfaction.  -She request the genetic test for Lynch syndrome.  She never had formal blood test for Lynch syndrome, however her tumor was MSI stable, and MMR proficient, Lynch syndrome is ruled out. I gave her a copy of these test results.  -I will see her as needed in the future. She will f/u with GI for routine screening colonoscopy.  Plan -She is doing well, has completed 5 years of cancer surveillance.  F/u with [place] as needed   No orders of the defined types were placed in this encounter.  All questions were answered. The patient knows to call the clinic with any problems, questions or concerns. No barriers to learning was detected. I spent 20 minutes counseling the patient face to face. The total time spent in the appointment was 25 minutes and more than 50% was on counseling and review of test results     Truitt Merle, MD 04/06/21

## 2021-05-26 DIAGNOSIS — D225 Melanocytic nevi of trunk: Secondary | ICD-10-CM | POA: Diagnosis not present

## 2021-05-26 DIAGNOSIS — Z85828 Personal history of other malignant neoplasm of skin: Secondary | ICD-10-CM | POA: Diagnosis not present

## 2021-05-26 DIAGNOSIS — D485 Neoplasm of uncertain behavior of skin: Secondary | ICD-10-CM | POA: Diagnosis not present

## 2021-05-26 DIAGNOSIS — L821 Other seborrheic keratosis: Secondary | ICD-10-CM | POA: Diagnosis not present

## 2021-05-26 DIAGNOSIS — L570 Actinic keratosis: Secondary | ICD-10-CM | POA: Diagnosis not present

## 2021-05-26 DIAGNOSIS — Z85820 Personal history of malignant melanoma of skin: Secondary | ICD-10-CM | POA: Diagnosis not present

## 2021-05-26 DIAGNOSIS — L820 Inflamed seborrheic keratosis: Secondary | ICD-10-CM | POA: Diagnosis not present

## 2021-06-03 DIAGNOSIS — Z03818 Encounter for observation for suspected exposure to other biological agents ruled out: Secondary | ICD-10-CM | POA: Diagnosis not present

## 2021-06-03 DIAGNOSIS — B349 Viral infection, unspecified: Secondary | ICD-10-CM | POA: Diagnosis not present

## 2021-06-03 DIAGNOSIS — Z20828 Contact with and (suspected) exposure to other viral communicable diseases: Secondary | ICD-10-CM | POA: Diagnosis not present

## 2021-06-03 DIAGNOSIS — J029 Acute pharyngitis, unspecified: Secondary | ICD-10-CM | POA: Diagnosis not present

## 2021-06-29 DIAGNOSIS — M8580 Other specified disorders of bone density and structure, unspecified site: Secondary | ICD-10-CM | POA: Diagnosis not present

## 2021-06-29 DIAGNOSIS — E7800 Pure hypercholesterolemia, unspecified: Secondary | ICD-10-CM | POA: Diagnosis not present

## 2021-06-29 DIAGNOSIS — Z131 Encounter for screening for diabetes mellitus: Secondary | ICD-10-CM | POA: Diagnosis not present

## 2021-06-29 DIAGNOSIS — Z0000 Encounter for general adult medical examination without abnormal findings: Secondary | ICD-10-CM | POA: Diagnosis not present

## 2021-07-06 ENCOUNTER — Other Ambulatory Visit: Payer: Self-pay | Admitting: Family Medicine

## 2021-07-06 DIAGNOSIS — Z85820 Personal history of malignant melanoma of skin: Secondary | ICD-10-CM | POA: Diagnosis not present

## 2021-07-06 DIAGNOSIS — Z23 Encounter for immunization: Secondary | ICD-10-CM | POA: Diagnosis not present

## 2021-07-06 DIAGNOSIS — F5101 Primary insomnia: Secondary | ICD-10-CM | POA: Diagnosis not present

## 2021-07-06 DIAGNOSIS — Z0000 Encounter for general adult medical examination without abnormal findings: Secondary | ICD-10-CM | POA: Diagnosis not present

## 2021-07-06 DIAGNOSIS — E7800 Pure hypercholesterolemia, unspecified: Secondary | ICD-10-CM

## 2021-07-06 DIAGNOSIS — Z85038 Personal history of other malignant neoplasm of large intestine: Secondary | ICD-10-CM | POA: Diagnosis not present

## 2021-07-06 DIAGNOSIS — M8580 Other specified disorders of bone density and structure, unspecified site: Secondary | ICD-10-CM | POA: Diagnosis not present

## 2021-07-06 DIAGNOSIS — Z8679 Personal history of other diseases of the circulatory system: Secondary | ICD-10-CM | POA: Diagnosis not present

## 2021-07-11 DIAGNOSIS — D1801 Hemangioma of skin and subcutaneous tissue: Secondary | ICD-10-CM | POA: Diagnosis not present

## 2021-07-11 DIAGNOSIS — D2271 Melanocytic nevi of right lower limb, including hip: Secondary | ICD-10-CM | POA: Diagnosis not present

## 2021-07-11 DIAGNOSIS — D225 Melanocytic nevi of trunk: Secondary | ICD-10-CM | POA: Diagnosis not present

## 2021-07-11 DIAGNOSIS — L570 Actinic keratosis: Secondary | ICD-10-CM | POA: Diagnosis not present

## 2021-07-11 DIAGNOSIS — L814 Other melanin hyperpigmentation: Secondary | ICD-10-CM | POA: Diagnosis not present

## 2021-07-11 DIAGNOSIS — L821 Other seborrheic keratosis: Secondary | ICD-10-CM | POA: Diagnosis not present

## 2021-07-11 DIAGNOSIS — D2262 Melanocytic nevi of left upper limb, including shoulder: Secondary | ICD-10-CM | POA: Diagnosis not present

## 2021-07-11 DIAGNOSIS — Z85820 Personal history of malignant melanoma of skin: Secondary | ICD-10-CM | POA: Diagnosis not present

## 2021-07-11 DIAGNOSIS — D2272 Melanocytic nevi of left lower limb, including hip: Secondary | ICD-10-CM | POA: Diagnosis not present

## 2021-07-11 DIAGNOSIS — L820 Inflamed seborrheic keratosis: Secondary | ICD-10-CM | POA: Diagnosis not present

## 2021-07-11 DIAGNOSIS — D2261 Melanocytic nevi of right upper limb, including shoulder: Secondary | ICD-10-CM | POA: Diagnosis not present

## 2021-07-11 DIAGNOSIS — Z85828 Personal history of other malignant neoplasm of skin: Secondary | ICD-10-CM | POA: Diagnosis not present

## 2021-08-18 DIAGNOSIS — R519 Headache, unspecified: Secondary | ICD-10-CM | POA: Diagnosis not present

## 2021-08-18 DIAGNOSIS — J209 Acute bronchitis, unspecified: Secondary | ICD-10-CM | POA: Diagnosis not present

## 2021-08-18 DIAGNOSIS — R051 Acute cough: Secondary | ICD-10-CM | POA: Diagnosis not present

## 2021-08-18 DIAGNOSIS — J069 Acute upper respiratory infection, unspecified: Secondary | ICD-10-CM | POA: Diagnosis not present

## 2021-08-18 DIAGNOSIS — Z03818 Encounter for observation for suspected exposure to other biological agents ruled out: Secondary | ICD-10-CM | POA: Diagnosis not present

## 2021-09-05 DIAGNOSIS — D3131 Benign neoplasm of right choroid: Secondary | ICD-10-CM | POA: Diagnosis not present

## 2021-09-13 ENCOUNTER — Inpatient Hospital Stay: Admission: RE | Admit: 2021-09-13 | Payer: PPO | Source: Ambulatory Visit

## 2021-12-19 DIAGNOSIS — Z1231 Encounter for screening mammogram for malignant neoplasm of breast: Secondary | ICD-10-CM | POA: Diagnosis not present

## 2022-01-10 DIAGNOSIS — D2262 Melanocytic nevi of left upper limb, including shoulder: Secondary | ICD-10-CM | POA: Diagnosis not present

## 2022-01-10 DIAGNOSIS — D485 Neoplasm of uncertain behavior of skin: Secondary | ICD-10-CM | POA: Diagnosis not present

## 2022-01-10 DIAGNOSIS — Z85828 Personal history of other malignant neoplasm of skin: Secondary | ICD-10-CM | POA: Diagnosis not present

## 2022-01-10 DIAGNOSIS — L720 Epidermal cyst: Secondary | ICD-10-CM | POA: Diagnosis not present

## 2022-01-10 DIAGNOSIS — D1801 Hemangioma of skin and subcutaneous tissue: Secondary | ICD-10-CM | POA: Diagnosis not present

## 2022-01-10 DIAGNOSIS — D225 Melanocytic nevi of trunk: Secondary | ICD-10-CM | POA: Diagnosis not present

## 2022-01-10 DIAGNOSIS — D2271 Melanocytic nevi of right lower limb, including hip: Secondary | ICD-10-CM | POA: Diagnosis not present

## 2022-01-10 DIAGNOSIS — L820 Inflamed seborrheic keratosis: Secondary | ICD-10-CM | POA: Diagnosis not present

## 2022-01-10 DIAGNOSIS — Z85820 Personal history of malignant melanoma of skin: Secondary | ICD-10-CM | POA: Diagnosis not present

## 2022-01-10 DIAGNOSIS — D2261 Melanocytic nevi of right upper limb, including shoulder: Secondary | ICD-10-CM | POA: Diagnosis not present

## 2022-01-10 DIAGNOSIS — L570 Actinic keratosis: Secondary | ICD-10-CM | POA: Diagnosis not present

## 2022-01-10 DIAGNOSIS — L821 Other seborrheic keratosis: Secondary | ICD-10-CM | POA: Diagnosis not present

## 2022-01-11 ENCOUNTER — Other Ambulatory Visit (HOSPITAL_COMMUNITY): Payer: Self-pay

## 2022-01-11 MED ORDER — NYSTATIN-TRIAMCINOLONE 100000-0.1 UNIT/GM-% EX OINT
TOPICAL_OINTMENT | CUTANEOUS | 1 refills | Status: DC
  Filled 2022-01-11: qty 30, 30d supply, fill #0

## 2022-01-12 ENCOUNTER — Other Ambulatory Visit (HOSPITAL_COMMUNITY): Payer: Self-pay

## 2022-01-13 ENCOUNTER — Other Ambulatory Visit (HOSPITAL_COMMUNITY): Payer: Self-pay

## 2022-01-13 MED ORDER — TRETINOIN 0.05 % EX CREA
TOPICAL_CREAM | CUTANEOUS | 3 refills | Status: AC
  Filled 2022-01-13: qty 20, 30d supply, fill #0

## 2022-01-13 MED ORDER — MUPIROCIN 2 % EX OINT
TOPICAL_OINTMENT | CUTANEOUS | 2 refills | Status: AC
  Filled 2022-01-13: qty 22, 30d supply, fill #0

## 2022-02-22 ENCOUNTER — Other Ambulatory Visit (HOSPITAL_COMMUNITY): Payer: Self-pay

## 2022-02-25 DIAGNOSIS — R051 Acute cough: Secondary | ICD-10-CM | POA: Diagnosis not present

## 2022-07-11 DIAGNOSIS — Z0000 Encounter for general adult medical examination without abnormal findings: Secondary | ICD-10-CM | POA: Diagnosis not present

## 2022-07-11 DIAGNOSIS — Z6821 Body mass index (BMI) 21.0-21.9, adult: Secondary | ICD-10-CM | POA: Diagnosis not present

## 2022-07-11 DIAGNOSIS — Z1389 Encounter for screening for other disorder: Secondary | ICD-10-CM | POA: Diagnosis not present

## 2022-07-11 DIAGNOSIS — Z85038 Personal history of other malignant neoplasm of large intestine: Secondary | ICD-10-CM | POA: Diagnosis not present

## 2022-07-11 DIAGNOSIS — E7800 Pure hypercholesterolemia, unspecified: Secondary | ICD-10-CM | POA: Diagnosis not present

## 2022-07-13 DIAGNOSIS — L570 Actinic keratosis: Secondary | ICD-10-CM | POA: Diagnosis not present

## 2022-07-13 DIAGNOSIS — D2272 Melanocytic nevi of left lower limb, including hip: Secondary | ICD-10-CM | POA: Diagnosis not present

## 2022-07-13 DIAGNOSIS — D225 Melanocytic nevi of trunk: Secondary | ICD-10-CM | POA: Diagnosis not present

## 2022-07-13 DIAGNOSIS — L820 Inflamed seborrheic keratosis: Secondary | ICD-10-CM | POA: Diagnosis not present

## 2022-07-13 DIAGNOSIS — D2261 Melanocytic nevi of right upper limb, including shoulder: Secondary | ICD-10-CM | POA: Diagnosis not present

## 2022-07-13 DIAGNOSIS — D485 Neoplasm of uncertain behavior of skin: Secondary | ICD-10-CM | POA: Diagnosis not present

## 2022-07-13 DIAGNOSIS — D2271 Melanocytic nevi of right lower limb, including hip: Secondary | ICD-10-CM | POA: Diagnosis not present

## 2022-07-13 DIAGNOSIS — D2262 Melanocytic nevi of left upper limb, including shoulder: Secondary | ICD-10-CM | POA: Diagnosis not present

## 2022-07-13 DIAGNOSIS — L821 Other seborrheic keratosis: Secondary | ICD-10-CM | POA: Diagnosis not present

## 2022-07-13 DIAGNOSIS — L245 Irritant contact dermatitis due to other chemical products: Secondary | ICD-10-CM | POA: Diagnosis not present

## 2022-07-13 DIAGNOSIS — Z85828 Personal history of other malignant neoplasm of skin: Secondary | ICD-10-CM | POA: Diagnosis not present

## 2022-07-13 DIAGNOSIS — Z85820 Personal history of malignant melanoma of skin: Secondary | ICD-10-CM | POA: Diagnosis not present

## 2022-07-14 DIAGNOSIS — Z6821 Body mass index (BMI) 21.0-21.9, adult: Secondary | ICD-10-CM | POA: Diagnosis not present

## 2022-07-14 DIAGNOSIS — Z85038 Personal history of other malignant neoplasm of large intestine: Secondary | ICD-10-CM | POA: Diagnosis not present

## 2022-07-14 DIAGNOSIS — E7800 Pure hypercholesterolemia, unspecified: Secondary | ICD-10-CM | POA: Diagnosis not present

## 2022-09-15 DIAGNOSIS — D3131 Benign neoplasm of right choroid: Secondary | ICD-10-CM | POA: Diagnosis not present

## 2022-10-24 DIAGNOSIS — M816 Localized osteoporosis [Lequesne]: Secondary | ICD-10-CM | POA: Diagnosis not present

## 2022-10-24 DIAGNOSIS — Z8262 Family history of osteoporosis: Secondary | ICD-10-CM | POA: Diagnosis not present

## 2022-10-24 DIAGNOSIS — N958 Other specified menopausal and perimenopausal disorders: Secondary | ICD-10-CM | POA: Diagnosis not present

## 2022-10-31 DIAGNOSIS — Z85820 Personal history of malignant melanoma of skin: Secondary | ICD-10-CM | POA: Diagnosis not present

## 2022-10-31 DIAGNOSIS — I788 Other diseases of capillaries: Secondary | ICD-10-CM | POA: Diagnosis not present

## 2022-10-31 DIAGNOSIS — L820 Inflamed seborrheic keratosis: Secondary | ICD-10-CM | POA: Diagnosis not present

## 2022-10-31 DIAGNOSIS — L570 Actinic keratosis: Secondary | ICD-10-CM | POA: Diagnosis not present

## 2022-10-31 DIAGNOSIS — Z85828 Personal history of other malignant neoplasm of skin: Secondary | ICD-10-CM | POA: Diagnosis not present

## 2022-10-31 DIAGNOSIS — L821 Other seborrheic keratosis: Secondary | ICD-10-CM | POA: Diagnosis not present

## 2022-11-07 ENCOUNTER — Other Ambulatory Visit (HOSPITAL_COMMUNITY): Payer: Self-pay

## 2022-11-07 DIAGNOSIS — M810 Age-related osteoporosis without current pathological fracture: Secondary | ICD-10-CM | POA: Diagnosis not present

## 2022-11-07 DIAGNOSIS — Z6821 Body mass index (BMI) 21.0-21.9, adult: Secondary | ICD-10-CM | POA: Diagnosis not present

## 2022-11-07 DIAGNOSIS — Z01419 Encounter for gynecological examination (general) (routine) without abnormal findings: Secondary | ICD-10-CM | POA: Diagnosis not present

## 2022-11-07 MED ORDER — IBANDRONATE SODIUM 150 MG PO TABS
150.0000 mg | ORAL_TABLET | ORAL | 6 refills | Status: DC
  Filled 2022-11-07: qty 3, 90d supply, fill #0

## 2022-11-09 ENCOUNTER — Other Ambulatory Visit (HOSPITAL_COMMUNITY): Payer: Self-pay

## 2022-11-09 MED ORDER — ALENDRONATE SODIUM 70 MG PO TABS
ORAL_TABLET | ORAL | 11 refills | Status: DC
  Filled 2022-11-09: qty 8, 56d supply, fill #0
  Filled 2023-06-13: qty 8, 56d supply, fill #1

## 2022-11-10 ENCOUNTER — Other Ambulatory Visit (HOSPITAL_COMMUNITY): Payer: Self-pay

## 2022-12-14 NOTE — Progress Notes (Unsigned)
  Cardiology Office Note:   Date:  12/26/2022  ID:  Molly Vang, DOB 1956/03/18, MRN [id number]  History of Present Illness:   Silver Achey is a 67 y.o. female rectal cancer, SVT s/p ablation in 2015, coronary calcification on CT chest who is now referred by Dr. Hyacinth Meeker for CV risk stratification.  Patient seen by Dr. Hyacinth Meeker on 11/14/22. Patient expressed interest in pursuing coronary Ca scoring and was subsequently referred for further evaluation.  Patient states that she overall feels well. No chest pain, SOB, orthopnea, or PND. No significant family history of CAD (father with HLD). Remains active with no exertional symptoms. Very occasional palpitations but has done well post ablation. Discussed Ca scoring at length today.    Past Medical History:  Diagnosis Date   Cancer [facility])    melanoma right bicep removed   Crepitus of cervical spine    face, neck, arms; MD and anesthesia dispute the cause   Osteoporosis    Postmenopausal    Rectal cancer (HCC) 2016   SVT (supraventricular tachycardia) 06/11/2013   svt on 10/6// ablation on 10/15     ROS: As per HPI  Studies Reviewed:    EKG:  NSR, HR 74       Risk Assessment/Calculations:              Physical Exam:   VS:  BP 126/88   Pulse 74   Ht 5\' 7"  (1.702 m)   Wt 139 lb 6.4 oz (63.2 kg)   SpO2 98%   BMI 21.83 kg/m    Wt Readings from Last 3 Encounters:  12/26/22 139 lb 6.4 oz (63.2 kg)  04/06/21 142 lb 6.4 oz (64.6 kg)  09/02/20 142 lb (64.4 kg)     GEN: Well nourished, well developed in no acute distress NECK: No JVD; No carotid bruits CARDIAC: RRR, no murmurs, rubs, gallops RESPIRATORY:  Clear to auscultation without rales, wheezing or rhonchi  ABDOMEN: Soft, non-tender, non-distended EXTREMITIES:  No edema; No deformity   ASSESSMENT AND PLAN:    #HLD: #CV Risk Stratification: -LDL 136; not currently on statin therapy and would like to avoid unless Ca score elevated -Check coronary Ca score for risk  stratification -Continue healthy lifestyle modifications  #SVT s/p Ablation: -Doing well with no recurrence of symptoms -Not on nodal agents        Signed, Meriam Sprague, MD

## 2022-12-26 ENCOUNTER — Ambulatory Visit: Payer: PPO | Attending: Cardiology | Admitting: Cardiology

## 2022-12-26 ENCOUNTER — Encounter: Payer: Self-pay | Admitting: Cardiology

## 2022-12-26 VITALS — BP 126/88 | HR 74 | Ht 67.0 in | Wt 139.4 lb

## 2022-12-26 DIAGNOSIS — E785 Hyperlipidemia, unspecified: Secondary | ICD-10-CM

## 2022-12-26 DIAGNOSIS — I471 Supraventricular tachycardia, unspecified: Secondary | ICD-10-CM

## 2022-12-26 NOTE — Patient Instructions (Signed)
Medication Instructions:  Your physician recommends that you continue on your current medications as directed. Please refer to the Current Medication list given to you today.  *If you need a refill on your cardiac medications before your next appointment, please call your pharmacy*   Lab Work: NONE If you have labs (blood work) drawn today and your tests are completely normal, you will receive your results only by: MyChart Message (if you have MyChart) OR A paper copy in the mail If you have any lab test that is abnormal or we need to change your treatment, we will call you to review the results.   Testing/Procedures: CALCIUM CARDIAC SCORE ( SELF PAY)   Follow-Up: 1 YEAR At [facility], you and your health needs are our priority.  As part of our continuing mission to provide you with exceptional heart care, we have created designated Provider Care Teams.  These Care Teams include your primary Cardiologist (physician) and Advanced Practice Providers (APPs -  Physician Assistants and Nurse Practitioners) who all work together to provide you with the care you need, when you need it.  We recommend signing up for the patient portal called "MyChart".  Sign up information is provided on this After Visit Summary.  MyChart is used to connect with patients for Virtual Visits (Telemedicine).  Patients are able to view lab/test results, encounter notes, upcoming appointments, etc.  Non-urgent messages can be sent to your provider as well.   To learn more about what you can do with MyChart, go to [email or web address].    Your next appointment:   1 year(s)  Provider:   DR. Shari Prows

## 2022-12-27 ENCOUNTER — Other Ambulatory Visit (HOSPITAL_COMMUNITY): Payer: Self-pay

## 2022-12-27 ENCOUNTER — Other Ambulatory Visit: Payer: Self-pay

## 2022-12-27 DIAGNOSIS — Z1231 Encounter for screening mammogram for malignant neoplasm of breast: Secondary | ICD-10-CM | POA: Diagnosis not present

## 2022-12-27 MED ORDER — ALENDRONATE SODIUM 70 MG PO TABS
70.0000 mg | ORAL_TABLET | ORAL | 3 refills | Status: DC
  Filled 2022-12-27: qty 12, 84d supply, fill #0
  Filled 2023-03-25: qty 12, 84d supply, fill #1
  Filled 2023-05-21 – 2023-06-13 (×3): qty 12, 84d supply, fill #2
  Filled 2023-09-06: qty 12, 84d supply, fill #3

## 2022-12-29 ENCOUNTER — Ambulatory Visit (HOSPITAL_COMMUNITY)
Admission: RE | Admit: 2022-12-29 | Discharge: 2022-12-29 | Disposition: A | Discharge status: Home / Self Care | Payer: PPO | Source: Ambulatory Visit | Attending: Cardiology | Admitting: Cardiology

## 2022-12-29 DIAGNOSIS — E785 Hyperlipidemia, unspecified: Secondary | ICD-10-CM | POA: Insufficient documentation

## 2023-01-01 ENCOUNTER — Other Ambulatory Visit: Payer: Self-pay | Admitting: Obstetrics and Gynecology

## 2023-01-01 DIAGNOSIS — R928 Other abnormal and inconclusive findings on diagnostic imaging of breast: Secondary | ICD-10-CM

## 2023-01-05 DIAGNOSIS — R92322 Mammographic fibroglandular density, left breast: Secondary | ICD-10-CM | POA: Diagnosis not present

## 2023-01-05 DIAGNOSIS — R928 Other abnormal and inconclusive findings on diagnostic imaging of breast: Secondary | ICD-10-CM | POA: Diagnosis not present

## 2023-01-08 DIAGNOSIS — J029 Acute pharyngitis, unspecified: Secondary | ICD-10-CM | POA: Diagnosis not present

## 2023-01-08 DIAGNOSIS — Z6821 Body mass index (BMI) 21.0-21.9, adult: Secondary | ICD-10-CM | POA: Diagnosis not present

## 2023-01-09 ENCOUNTER — Other Ambulatory Visit (HOSPITAL_COMMUNITY): Payer: Self-pay

## 2023-01-09 MED ORDER — AZITHROMYCIN 250 MG PO TABS
ORAL_TABLET | ORAL | 0 refills | Status: AC
  Filled 2023-01-09: qty 6, 5d supply, fill #0

## 2023-01-11 DIAGNOSIS — D224 Melanocytic nevi of scalp and neck: Secondary | ICD-10-CM | POA: Diagnosis not present

## 2023-01-11 DIAGNOSIS — D2271 Melanocytic nevi of right lower limb, including hip: Secondary | ICD-10-CM | POA: Diagnosis not present

## 2023-01-11 DIAGNOSIS — D2272 Melanocytic nevi of left lower limb, including hip: Secondary | ICD-10-CM | POA: Diagnosis not present

## 2023-01-11 DIAGNOSIS — L821 Other seborrheic keratosis: Secondary | ICD-10-CM | POA: Diagnosis not present

## 2023-01-11 DIAGNOSIS — L570 Actinic keratosis: Secondary | ICD-10-CM | POA: Diagnosis not present

## 2023-01-11 DIAGNOSIS — Z85820 Personal history of malignant melanoma of skin: Secondary | ICD-10-CM | POA: Diagnosis not present

## 2023-01-11 DIAGNOSIS — D1801 Hemangioma of skin and subcutaneous tissue: Secondary | ICD-10-CM | POA: Diagnosis not present

## 2023-01-11 DIAGNOSIS — L814 Other melanin hyperpigmentation: Secondary | ICD-10-CM | POA: Diagnosis not present

## 2023-01-11 DIAGNOSIS — L720 Epidermal cyst: Secondary | ICD-10-CM | POA: Diagnosis not present

## 2023-01-11 DIAGNOSIS — Z85828 Personal history of other malignant neoplasm of skin: Secondary | ICD-10-CM | POA: Diagnosis not present

## 2023-01-11 DIAGNOSIS — L820 Inflamed seborrheic keratosis: Secondary | ICD-10-CM | POA: Diagnosis not present

## 2023-01-11 DIAGNOSIS — D225 Melanocytic nevi of trunk: Secondary | ICD-10-CM | POA: Diagnosis not present

## 2023-01-31 ENCOUNTER — Other Ambulatory Visit (HOSPITAL_COMMUNITY): Payer: Self-pay

## 2023-02-21 ENCOUNTER — Encounter: Payer: Self-pay | Admitting: Cardiology

## 2023-03-07 ENCOUNTER — Other Ambulatory Visit (HOSPITAL_COMMUNITY): Payer: Self-pay

## 2023-03-09 ENCOUNTER — Other Ambulatory Visit (HOSPITAL_COMMUNITY): Payer: Self-pay

## 2023-03-09 MED ORDER — ZOLPIDEM TARTRATE 10 MG PO TABS
5.0000 mg | ORAL_TABLET | Freq: Every evening | ORAL | 0 refills | Status: AC | PRN
  Filled 2023-03-09: qty 15, 30d supply, fill #0

## 2023-03-26 ENCOUNTER — Other Ambulatory Visit (HOSPITAL_COMMUNITY): Payer: Self-pay

## 2023-04-23 DIAGNOSIS — J029 Acute pharyngitis, unspecified: Secondary | ICD-10-CM | POA: Diagnosis not present

## 2023-04-23 DIAGNOSIS — R052 Subacute cough: Secondary | ICD-10-CM | POA: Diagnosis not present

## 2023-04-23 DIAGNOSIS — R0981 Nasal congestion: Secondary | ICD-10-CM | POA: Diagnosis not present

## 2023-05-21 ENCOUNTER — Other Ambulatory Visit (HOSPITAL_COMMUNITY): Payer: Self-pay

## 2023-06-08 ENCOUNTER — Other Ambulatory Visit (HOSPITAL_COMMUNITY): Payer: Self-pay

## 2023-06-13 ENCOUNTER — Other Ambulatory Visit (HOSPITAL_COMMUNITY): Payer: Self-pay

## 2023-08-06 DIAGNOSIS — D2262 Melanocytic nevi of left upper limb, including shoulder: Secondary | ICD-10-CM | POA: Diagnosis not present

## 2023-08-06 DIAGNOSIS — Z85820 Personal history of malignant melanoma of skin: Secondary | ICD-10-CM | POA: Diagnosis not present

## 2023-08-06 DIAGNOSIS — D2261 Melanocytic nevi of right upper limb, including shoulder: Secondary | ICD-10-CM | POA: Diagnosis not present

## 2023-08-06 DIAGNOSIS — D224 Melanocytic nevi of scalp and neck: Secondary | ICD-10-CM | POA: Diagnosis not present

## 2023-08-06 DIAGNOSIS — Z85828 Personal history of other malignant neoplasm of skin: Secondary | ICD-10-CM | POA: Diagnosis not present

## 2023-08-06 DIAGNOSIS — L820 Inflamed seborrheic keratosis: Secondary | ICD-10-CM | POA: Diagnosis not present

## 2023-08-06 DIAGNOSIS — D1801 Hemangioma of skin and subcutaneous tissue: Secondary | ICD-10-CM | POA: Diagnosis not present

## 2023-08-06 DIAGNOSIS — D225 Melanocytic nevi of trunk: Secondary | ICD-10-CM | POA: Diagnosis not present

## 2023-08-06 DIAGNOSIS — D2272 Melanocytic nevi of left lower limb, including hip: Secondary | ICD-10-CM | POA: Diagnosis not present

## 2023-08-06 DIAGNOSIS — L918 Other hypertrophic disorders of the skin: Secondary | ICD-10-CM | POA: Diagnosis not present

## 2023-08-06 DIAGNOSIS — L821 Other seborrheic keratosis: Secondary | ICD-10-CM | POA: Diagnosis not present

## 2023-08-06 DIAGNOSIS — L570 Actinic keratosis: Secondary | ICD-10-CM | POA: Diagnosis not present

## 2023-08-07 DIAGNOSIS — Z0000 Encounter for general adult medical examination without abnormal findings: Secondary | ICD-10-CM | POA: Diagnosis not present

## 2023-08-07 DIAGNOSIS — Z6821 Body mass index (BMI) 21.0-21.9, adult: Secondary | ICD-10-CM | POA: Diagnosis not present

## 2023-08-07 DIAGNOSIS — Z1331 Encounter for screening for depression: Secondary | ICD-10-CM | POA: Diagnosis not present

## 2023-08-14 DIAGNOSIS — Z6821 Body mass index (BMI) 21.0-21.9, adult: Secondary | ICD-10-CM | POA: Diagnosis not present

## 2023-08-14 DIAGNOSIS — M810 Age-related osteoporosis without current pathological fracture: Secondary | ICD-10-CM | POA: Diagnosis not present

## 2023-08-14 DIAGNOSIS — F5101 Primary insomnia: Secondary | ICD-10-CM | POA: Diagnosis not present

## 2023-08-14 DIAGNOSIS — Z8679 Personal history of other diseases of the circulatory system: Secondary | ICD-10-CM | POA: Diagnosis not present

## 2023-08-14 DIAGNOSIS — R1031 Right lower quadrant pain: Secondary | ICD-10-CM | POA: Diagnosis not present

## 2023-09-06 ENCOUNTER — Other Ambulatory Visit (HOSPITAL_COMMUNITY): Payer: Self-pay

## 2023-09-10 ENCOUNTER — Other Ambulatory Visit (HOSPITAL_COMMUNITY): Payer: Self-pay

## 2023-09-10 MED ORDER — ALENDRONATE SODIUM 70 MG PO TABS
70.0000 mg | ORAL_TABLET | ORAL | 3 refills | Status: AC
  Filled 2023-09-10 – 2023-11-29 (×2): qty 12, 84d supply, fill #0
  Filled 2024-03-02: qty 12, 84d supply, fill #1
  Filled 2024-05-23: qty 12, 84d supply, fill #2
  Filled 2024-08-15: qty 12, 84d supply, fill #3

## 2023-11-29 ENCOUNTER — Other Ambulatory Visit (HOSPITAL_COMMUNITY): Payer: Self-pay

## 2023-12-21 ENCOUNTER — Other Ambulatory Visit (HOSPITAL_COMMUNITY): Payer: Self-pay

## 2023-12-21 MED ORDER — FLUOROURACIL 5 % EX CREA
1.0000 g | TOPICAL_CREAM | Freq: Two times a day (BID) | CUTANEOUS | 0 refills | Status: AC
  Filled 2023-12-21: qty 40, 30d supply, fill #0

## 2023-12-22 NOTE — Progress Notes (Unsigned)
 Cardiology Office Note:    Date:  12/26/2023   ID:  Molly Vang, DOB 1955/10/02, MRN [id number]  PCP:  Perley Bradley, MD  Cardiologist:  Sheryle Donning, MD     Referring MD: Perley Bradley, MD   Chief Complaint: routine follow-up of SVT  History of Present Illness:    Molly Vang is a 68 y.o. female with a history of coronary artery calcifications noted on chest CT but coronary calcium score 0 in 12/2022, SVT s/p ablation in 2015, hyperlipidemia, and rectal cancer who presents today for routine follow-up.   Patient was previously followed by Dr. Danise Durie and Dr. Carolynne Citron for paroxysmal SVT. She underwent an ablation in 2015. Prior Echo in 2013 showed normal LV function and no significant valvular disease. She was referred back to Dr. Ardell Beauvais in 11/2022 after not being seen by Cardiology in almost 10 years for further risk stratification and to help determine need of statin given her hyperlipidemia. She reported occasional palpitations at that visit but no other cardiac symptoms. Coronary calcium score was ordered and came back at 0.   Patient presents today for follow-up. She is doing well since last visit. She reports infrequent and brief episodes of palpitations that she describes as her heart beating fast with some associated lightheadedness. However, she states the symptoms only last "momentarily." No sustained palpitations. No chest pain, shortness of breath, orthopnea, PND, edema, syncope.   EKGs/Labs/Other Studies Reviewed:    The following studies were reviewed:  CT Cardiac Scoring 12/29/2022: Impression: 1. Coronary calcium score of 0.   EKG:  EKG ordered today.   EKG Interpretation Date/Time:  Wednesday December 26 2023 13:47:07 EDT Ventricular Rate:  67 PR Interval:  136 QRS Duration:  78 QT Interval:  426 QTC Calculation: 450 R Axis:   14  Text Interpretation: Normal sinus rhythm with sinus arrhythmia No acute ischemic changes When compared with ECG of 23-Nov-2015  10:31, No significant change was found Confirmed by Sharren Decree [phone]) on 12/26/2023 1:49:36 PM    Recent Labs: No results found for requested labs within last 365 days.  Recent Lipid Panel No results found for: "CHOL", "TRIG", "HDL", "CHOLHDL", "VLDL", "LDLCALC", "LDLDIRECT"  Physical Exam:    Vital Signs: BP 112/78   Pulse 73   Ht 5\' 7"  (1.702 m)   Wt 140 lb 9.6 oz (63.8 kg)   SpO2 97%   BMI 22.02 kg/m     Wt Readings from Last 3 Encounters:  12/26/23 140 lb 9.6 oz (63.8 kg)  12/26/22 139 lb 6.4 oz (63.2 kg)  04/06/21 142 lb 6.4 oz (64.6 kg)     General: 68 y.o. thin Caucasian female in no acute distress. HEENT: Normocephalic and atraumatic. Sclera clear.  Neck: Supple. No carotid bruits. No JVD. Heart: RRR. Distinct S1 and S2. No murmurs, gallops, or rubs.  Lungs: No increased work of breathing. Clear to ausculation bilaterally. No wheezes, rhonchi, or rales.  Extremities: No lower extremity edema.  Radial and posterior tibial pulses 2+ and equal bilaterally. Skin: Warm and dry. Neuro: No focal deficits. Psych: Normal affect. Responds appropriately.   Assessment:    1. Coronary artery calcification   2. Paroxysmal SVT (supraventricular tachycardia) (HCC)   3. Hyperlipidemia, unspecified hyperlipidemia type     Plan:    Coronary Artery Calcification Patient reportedly has coronary artery calcifications noted on a prior chest CT. However, coronary calcium score in 12/2022 was 0.  - No chest pain.  - After visit, I realized she is  on Aspirin 81mg  daily. However, with coronary calcium score of 0, she can stop this. Will notify her of this via MyChart.  Paroxysmal SVT S/p ablation in 2015.  - She reports rare and brief palpitations but nothing significant.  - Not on any AV nodal agents. Do not think she needs any at this time.  - If palpitations increase in frequency or duration, can order a Zio monitor.  Hyperlipidemia Lipid panel in 06/2022: Total  Cholesterol 225, Triglycerides 84, HDL 74, LDL 136. ASCVD 10 year risk 6.4% which is considered borderline. - She has previously wanted to avoid a statin. Given coronary calcium score of 0, she was not started on one after last check. - Labs followed by PCP. I would recommend recommend routine following of lipid panel and hemoglobin A1c. If LDL continues to increase or she develops prediabetes / diabetes, would consider starting a statin.  Disposition: Follow up in 1 year. She was previously followed by Dr. Ardell Beauvais but plan is to get re-established with Dr. Veryl Gottron.    Signed, Kathyjo Briere E Sherrell Farish, PA-C  12/26/2023 4:31 PM    [facility]

## 2023-12-26 ENCOUNTER — Encounter: Payer: Self-pay | Admitting: Student

## 2023-12-26 ENCOUNTER — Ambulatory Visit: Payer: PPO | Attending: Student | Admitting: Student

## 2023-12-26 VITALS — BP 112/78 | HR 73 | Ht 67.0 in | Wt 140.6 lb

## 2023-12-26 DIAGNOSIS — E785 Hyperlipidemia, unspecified: Secondary | ICD-10-CM

## 2023-12-26 DIAGNOSIS — I471 Supraventricular tachycardia, unspecified: Secondary | ICD-10-CM | POA: Diagnosis not present

## 2023-12-26 DIAGNOSIS — I2510 Atherosclerotic heart disease of native coronary artery without angina pectoris: Secondary | ICD-10-CM | POA: Diagnosis not present

## 2023-12-26 NOTE — Patient Instructions (Signed)
 Medication Instructions:  The current medical regimen is effective;  continue present plan and medications as directed. Please refer to the Current Medication list given to you today.  *If you need a refill on your cardiac medications before your next appointment, please call your pharmacy*  Lab Work: NONE  Testing/Procedures: NONE  Follow-Up: At [facility], you and your health needs are our priority.  As part of our continuing mission to provide you with exceptional heart care, our providers are all part of one team.  This team includes your primary Cardiologist (physician) and Advanced Practice Providers or APPs (Physician Assistants and Nurse Practitioners) who all work together to provide you with the care you need, when you need it.  Your next appointment:   12 month(s)  Provider:   Sheryle Donning, MD or Neomi Banks, NP

## 2023-12-27 DIAGNOSIS — L821 Other seborrheic keratosis: Secondary | ICD-10-CM | POA: Diagnosis not present

## 2023-12-27 DIAGNOSIS — S40811A Abrasion of right upper arm, initial encounter: Secondary | ICD-10-CM | POA: Diagnosis not present

## 2023-12-27 DIAGNOSIS — Z85828 Personal history of other malignant neoplasm of skin: Secondary | ICD-10-CM | POA: Diagnosis not present

## 2023-12-27 DIAGNOSIS — Z85820 Personal history of malignant melanoma of skin: Secondary | ICD-10-CM | POA: Diagnosis not present

## 2024-01-04 DIAGNOSIS — Z1231 Encounter for screening mammogram for malignant neoplasm of breast: Secondary | ICD-10-CM | POA: Diagnosis not present

## 2024-02-25 DIAGNOSIS — L570 Actinic keratosis: Secondary | ICD-10-CM | POA: Diagnosis not present

## 2024-02-25 DIAGNOSIS — D224 Melanocytic nevi of scalp and neck: Secondary | ICD-10-CM | POA: Diagnosis not present

## 2024-02-25 DIAGNOSIS — Z85828 Personal history of other malignant neoplasm of skin: Secondary | ICD-10-CM | POA: Diagnosis not present

## 2024-02-25 DIAGNOSIS — D2261 Melanocytic nevi of right upper limb, including shoulder: Secondary | ICD-10-CM | POA: Diagnosis not present

## 2024-02-25 DIAGNOSIS — D225 Melanocytic nevi of trunk: Secondary | ICD-10-CM | POA: Diagnosis not present

## 2024-02-25 DIAGNOSIS — D1801 Hemangioma of skin and subcutaneous tissue: Secondary | ICD-10-CM | POA: Diagnosis not present

## 2024-02-25 DIAGNOSIS — D2271 Melanocytic nevi of right lower limb, including hip: Secondary | ICD-10-CM | POA: Diagnosis not present

## 2024-02-25 DIAGNOSIS — L814 Other melanin hyperpigmentation: Secondary | ICD-10-CM | POA: Diagnosis not present

## 2024-02-25 DIAGNOSIS — Z85820 Personal history of malignant melanoma of skin: Secondary | ICD-10-CM | POA: Diagnosis not present

## 2024-02-25 DIAGNOSIS — L720 Epidermal cyst: Secondary | ICD-10-CM | POA: Diagnosis not present

## 2024-02-25 DIAGNOSIS — L821 Other seborrheic keratosis: Secondary | ICD-10-CM | POA: Diagnosis not present

## 2024-02-25 DIAGNOSIS — D2262 Melanocytic nevi of left upper limb, including shoulder: Secondary | ICD-10-CM | POA: Diagnosis not present

## 2024-03-26 DIAGNOSIS — J069 Acute upper respiratory infection, unspecified: Secondary | ICD-10-CM | POA: Diagnosis not present

## 2024-09-15 ENCOUNTER — Other Ambulatory Visit (HOSPITAL_COMMUNITY): Payer: Self-pay

## 2024-09-15 MED ORDER — TRETINOIN 0.05 % EX CREA
TOPICAL_CREAM | CUTANEOUS | 3 refills | Status: AC
  Filled 2024-09-15: qty 45, 60d supply, fill #0
